# Patient Record
Sex: Female | Born: 1935 | Race: White | Hispanic: No | Smoking: Former smoker
Health system: Southern US, Community
[De-identification: ages and names within clinical notes are randomized; demographics above are authoritative.]

## PROBLEM LIST (undated history)

## (undated) DIAGNOSIS — N19 Unspecified kidney failure: Secondary | ICD-10-CM

## (undated) DIAGNOSIS — J449 Chronic obstructive pulmonary disease, unspecified: Secondary | ICD-10-CM

## (undated) DIAGNOSIS — B370 Candidal stomatitis: Secondary | ICD-10-CM

## (undated) DIAGNOSIS — I639 Cerebral infarction, unspecified: Secondary | ICD-10-CM

## (undated) DIAGNOSIS — R399 Unspecified symptoms and signs involving the genitourinary system: Secondary | ICD-10-CM

## (undated) DIAGNOSIS — M1990 Unspecified osteoarthritis, unspecified site: Secondary | ICD-10-CM

## (undated) DIAGNOSIS — M48061 Spinal stenosis, lumbar region without neurogenic claudication: Secondary | ICD-10-CM

## (undated) DIAGNOSIS — M810 Age-related osteoporosis without current pathological fracture: Secondary | ICD-10-CM

## (undated) DIAGNOSIS — I719 Aortic aneurysm of unspecified site, without rupture: Secondary | ICD-10-CM

## (undated) DIAGNOSIS — G4700 Insomnia, unspecified: Secondary | ICD-10-CM

## (undated) DIAGNOSIS — R1013 Epigastric pain: Secondary | ICD-10-CM

## (undated) DIAGNOSIS — I2510 Atherosclerotic heart disease of native coronary artery without angina pectoris: Secondary | ICD-10-CM

## (undated) DIAGNOSIS — T7840XA Allergy, unspecified, initial encounter: Secondary | ICD-10-CM

## (undated) DIAGNOSIS — M7020 Olecranon bursitis, unspecified elbow: Secondary | ICD-10-CM

## (undated) DIAGNOSIS — E7800 Pure hypercholesterolemia, unspecified: Secondary | ICD-10-CM

## (undated) DIAGNOSIS — E039 Hypothyroidism, unspecified: Secondary | ICD-10-CM

## (undated) DIAGNOSIS — R351 Nocturia: Secondary | ICD-10-CM

## (undated) DIAGNOSIS — L821 Other seborrheic keratosis: Secondary | ICD-10-CM

## (undated) DIAGNOSIS — N952 Postmenopausal atrophic vaginitis: Secondary | ICD-10-CM

## (undated) DIAGNOSIS — R0902 Hypoxemia: Secondary | ICD-10-CM

## (undated) DIAGNOSIS — J45909 Unspecified asthma, uncomplicated: Secondary | ICD-10-CM

## (undated) DIAGNOSIS — Z9049 Acquired absence of other specified parts of digestive tract: Secondary | ICD-10-CM

## (undated) DIAGNOSIS — K219 Gastro-esophageal reflux disease without esophagitis: Secondary | ICD-10-CM

## (undated) DIAGNOSIS — R0681 Apnea, not elsewhere classified: Secondary | ICD-10-CM

## (undated) DIAGNOSIS — I10 Essential (primary) hypertension: Secondary | ICD-10-CM

## (undated) DIAGNOSIS — J189 Pneumonia, unspecified organism: Secondary | ICD-10-CM

## (undated) HISTORY — DX: Atherosclerotic heart disease of native coronary artery without angina pectoris: I25.10

## (undated) HISTORY — DX: Essential (primary) hypertension: I10

## (undated) HISTORY — DX: Postmenopausal atrophic vaginitis: N95.2

## (undated) HISTORY — DX: Aortic aneurysm of unspecified site, without rupture: I71.9

## (undated) HISTORY — DX: Unspecified kidney failure: N19

## (undated) HISTORY — DX: Candidal stomatitis: B37.0

## (undated) HISTORY — DX: Unspecified asthma, uncomplicated: J45.909

## (undated) HISTORY — DX: Age-related osteoporosis without current pathological fracture: M81.0

## (undated) HISTORY — DX: Pneumonia, unspecified organism: J18.9

## (undated) HISTORY — DX: Unspecified osteoarthritis, unspecified site: M19.90

## (undated) HISTORY — DX: Other seborrheic keratosis: L82.1

## (undated) HISTORY — DX: Nocturia: R35.1

## (undated) HISTORY — DX: Hypoxemia: R09.02

## (undated) HISTORY — PX: WRIST SURGERY: SHX841

## (undated) HISTORY — DX: Apnea, not elsewhere classified: R06.81

## (undated) HISTORY — PX: EYE SURGERY: SHX253

## (undated) HISTORY — DX: Acquired absence of other specified parts of digestive tract: Z90.49

## (undated) HISTORY — DX: Cerebral infarction, unspecified: I63.9

## (undated) HISTORY — DX: Allergy, unspecified, initial encounter: T78.40XA

## (undated) HISTORY — PX: BACK SURGERY: SHX140

## (undated) HISTORY — DX: Epigastric pain: R10.13

## (undated) HISTORY — DX: Insomnia, unspecified: G47.00

## (undated) HISTORY — DX: Spinal stenosis, lumbar region without neurogenic claudication: M48.061

## (undated) HISTORY — DX: Gastro-esophageal reflux disease without esophagitis: K21.9

## (undated) HISTORY — DX: Pure hypercholesterolemia, unspecified: E78.00

## (undated) HISTORY — DX: Unspecified symptoms and signs involving the genitourinary system: R39.9

## (undated) HISTORY — DX: Olecranon bursitis, unspecified elbow: M70.20

## (undated) HISTORY — PX: COLON SURGERY: SHX602

## (undated) HISTORY — DX: Chronic obstructive pulmonary disease, unspecified: J44.9

## (undated) HISTORY — PX: APPENDECTOMY: SHX54

---

## 1958-02-12 HISTORY — PX: VAGINAL HYSTERECTOMY: SUR661

## 2003-05-01 ENCOUNTER — Other Ambulatory Visit: Payer: Self-pay

## 2004-01-10 ENCOUNTER — Emergency Department: Payer: Self-pay | Admitting: Emergency Medicine

## 2004-01-10 ENCOUNTER — Other Ambulatory Visit: Payer: Self-pay

## 2004-03-09 ENCOUNTER — Ambulatory Visit: Payer: Self-pay | Admitting: Unknown Physician Specialty

## 2004-03-23 ENCOUNTER — Ambulatory Visit: Payer: Self-pay | Admitting: Psychiatry

## 2004-05-04 ENCOUNTER — Ambulatory Visit: Payer: Self-pay | Admitting: Psychiatry

## 2004-06-06 ENCOUNTER — Emergency Department: Payer: Self-pay | Admitting: Emergency Medicine

## 2004-06-22 ENCOUNTER — Ambulatory Visit: Payer: Self-pay | Admitting: Psychiatry

## 2004-06-23 ENCOUNTER — Ambulatory Visit: Payer: Self-pay

## 2004-07-25 ENCOUNTER — Ambulatory Visit: Payer: Self-pay | Admitting: Psychiatry

## 2004-08-03 ENCOUNTER — Ambulatory Visit: Payer: Self-pay | Admitting: Psychiatry

## 2004-09-25 ENCOUNTER — Ambulatory Visit: Payer: Self-pay | Admitting: Family Medicine

## 2004-11-29 ENCOUNTER — Ambulatory Visit: Payer: Self-pay | Admitting: Unknown Physician Specialty

## 2005-01-03 ENCOUNTER — Ambulatory Visit: Payer: Self-pay | Admitting: Unknown Physician Specialty

## 2005-01-11 ENCOUNTER — Ambulatory Visit: Payer: Self-pay | Admitting: Unknown Physician Specialty

## 2005-01-26 ENCOUNTER — Ambulatory Visit: Payer: Self-pay | Admitting: Surgery

## 2005-02-06 ENCOUNTER — Other Ambulatory Visit: Payer: Self-pay

## 2005-02-06 ENCOUNTER — Ambulatory Visit: Payer: Self-pay | Admitting: Surgery

## 2005-02-09 ENCOUNTER — Inpatient Hospital Stay: Payer: Self-pay | Admitting: Surgery

## 2005-02-12 HISTORY — PX: OTHER SURGICAL HISTORY: SHX169

## 2005-04-03 ENCOUNTER — Ambulatory Visit: Payer: Self-pay | Admitting: Internal Medicine

## 2005-08-13 ENCOUNTER — Emergency Department: Payer: Self-pay | Admitting: Emergency Medicine

## 2005-11-20 DIAGNOSIS — M810 Age-related osteoporosis without current pathological fracture: Secondary | ICD-10-CM | POA: Insufficient documentation

## 2005-11-20 DIAGNOSIS — I10 Essential (primary) hypertension: Secondary | ICD-10-CM

## 2005-11-21 ENCOUNTER — Ambulatory Visit: Payer: Self-pay | Admitting: Unknown Physician Specialty

## 2005-12-07 ENCOUNTER — Other Ambulatory Visit: Payer: Self-pay

## 2006-01-10 ENCOUNTER — Inpatient Hospital Stay: Payer: Self-pay | Admitting: Unknown Physician Specialty

## 2006-07-05 ENCOUNTER — Inpatient Hospital Stay: Payer: Self-pay | Admitting: Internal Medicine

## 2006-07-05 ENCOUNTER — Other Ambulatory Visit: Payer: Self-pay

## 2006-08-14 ENCOUNTER — Ambulatory Visit: Payer: Self-pay | Admitting: Unknown Physician Specialty

## 2006-08-19 ENCOUNTER — Ambulatory Visit: Payer: Self-pay | Admitting: Family Medicine

## 2006-08-28 ENCOUNTER — Ambulatory Visit: Payer: Self-pay | Admitting: Unknown Physician Specialty

## 2007-01-31 DIAGNOSIS — E7800 Pure hypercholesterolemia, unspecified: Secondary | ICD-10-CM

## 2007-02-10 ENCOUNTER — Ambulatory Visit: Payer: Self-pay | Admitting: Family Medicine

## 2007-09-01 ENCOUNTER — Ambulatory Visit: Payer: Self-pay | Admitting: Unknown Physician Specialty

## 2007-09-10 ENCOUNTER — Ambulatory Visit: Payer: Self-pay | Admitting: Pain Medicine

## 2007-09-16 DIAGNOSIS — M48061 Spinal stenosis, lumbar region without neurogenic claudication: Secondary | ICD-10-CM

## 2007-09-18 ENCOUNTER — Ambulatory Visit: Payer: Self-pay | Admitting: Pain Medicine

## 2007-10-07 ENCOUNTER — Ambulatory Visit: Payer: Self-pay | Admitting: Physician Assistant

## 2007-10-09 ENCOUNTER — Ambulatory Visit: Payer: Self-pay | Admitting: Pain Medicine

## 2007-10-23 ENCOUNTER — Ambulatory Visit: Payer: Self-pay | Admitting: Pain Medicine

## 2007-11-27 DIAGNOSIS — K219 Gastro-esophageal reflux disease without esophagitis: Secondary | ICD-10-CM | POA: Insufficient documentation

## 2007-12-10 ENCOUNTER — Ambulatory Visit: Payer: Self-pay | Admitting: Gastroenterology

## 2007-12-16 ENCOUNTER — Ambulatory Visit: Payer: Self-pay | Admitting: Unknown Physician Specialty

## 2008-02-13 HISTORY — PX: OTHER SURGICAL HISTORY: SHX169

## 2008-03-26 ENCOUNTER — Ambulatory Visit: Payer: Self-pay | Admitting: Unknown Physician Specialty

## 2008-03-29 ENCOUNTER — Inpatient Hospital Stay: Payer: Self-pay | Admitting: Unknown Physician Specialty

## 2008-05-03 ENCOUNTER — Inpatient Hospital Stay: Payer: Self-pay | Admitting: Internal Medicine

## 2008-11-12 DIAGNOSIS — M19049 Primary osteoarthritis, unspecified hand: Secondary | ICD-10-CM | POA: Insufficient documentation

## 2009-07-19 DIAGNOSIS — I719 Aortic aneurysm of unspecified site, without rupture: Secondary | ICD-10-CM | POA: Insufficient documentation

## 2009-07-26 ENCOUNTER — Ambulatory Visit: Payer: Self-pay | Admitting: Family Medicine

## 2009-12-07 ENCOUNTER — Ambulatory Visit: Payer: Self-pay

## 2009-12-16 ENCOUNTER — Emergency Department: Payer: Self-pay | Admitting: Emergency Medicine

## 2010-01-08 ENCOUNTER — Emergency Department: Payer: Self-pay | Admitting: Unknown Physician Specialty

## 2010-04-17 ENCOUNTER — Ambulatory Visit: Payer: Self-pay

## 2010-04-30 ENCOUNTER — Emergency Department: Payer: Self-pay | Admitting: Emergency Medicine

## 2010-05-05 ENCOUNTER — Ambulatory Visit: Payer: Self-pay | Admitting: Cardiovascular Disease

## 2010-08-07 ENCOUNTER — Emergency Department: Payer: Self-pay | Admitting: Emergency Medicine

## 2010-09-12 ENCOUNTER — Ambulatory Visit: Payer: Self-pay | Admitting: Family Medicine

## 2011-03-23 ENCOUNTER — Ambulatory Visit: Payer: Self-pay | Admitting: Family Medicine

## 2011-06-10 ENCOUNTER — Inpatient Hospital Stay: Payer: Self-pay | Admitting: Internal Medicine

## 2011-06-10 LAB — COMPREHENSIVE METABOLIC PANEL
Alkaline Phosphatase: 62 U/L (ref 50–136)
Anion Gap: 8 (ref 7–16)
Calcium, Total: 8.4 mg/dL — ABNORMAL LOW (ref 8.5–10.1)
Chloride: 103 mmol/L (ref 98–107)
Creatinine: 1.11 mg/dL (ref 0.60–1.30)
EGFR (Non-African Amer.): 46 — ABNORMAL LOW
Glucose: 101 mg/dL — ABNORMAL HIGH (ref 65–99)
Potassium: 4.1 mmol/L (ref 3.5–5.1)
SGOT(AST): 22 U/L (ref 15–37)
SGPT (ALT): 21 U/L
Sodium: 140 mmol/L (ref 136–145)

## 2011-06-10 LAB — CBC
HCT: 35.1 % (ref 35.0–47.0)
HGB: 11.5 g/dL — ABNORMAL LOW (ref 12.0–16.0)
MCH: 30.8 pg (ref 26.0–34.0)
MCHC: 32.8 g/dL (ref 32.0–36.0)
MCV: 94 fL (ref 80–100)
RBC: 3.75 10*6/uL — ABNORMAL LOW (ref 3.80–5.20)
RDW: 14.2 % (ref 11.5–14.5)
WBC: 9.6 10*3/uL (ref 3.6–11.0)

## 2011-06-10 LAB — CK TOTAL AND CKMB (NOT AT ARMC)
CK-MB: 3.5 ng/mL (ref 0.5–3.6)
CK-MB: 2.5 ng/mL (ref 0.5–3.6)

## 2011-06-10 LAB — TROPONIN I
Troponin-I: 0.02 ng/mL
Troponin-I: 0.02 ng/mL

## 2011-06-11 LAB — CBC WITH DIFFERENTIAL/PLATELET
Basophil #: 0 10*3/uL (ref 0.0–0.1)
Basophil %: 0.3 %
HGB: 10.8 g/dL — ABNORMAL LOW (ref 12.0–16.0)
Lymphocyte %: 6.6 %
MCH: 31.7 pg (ref 26.0–34.0)
Monocyte %: 2.5 %
Neutrophil #: 9.2 10*3/uL — ABNORMAL HIGH (ref 1.4–6.5)
Neutrophil %: 90.6 %
Platelet: 188 10*3/uL (ref 150–440)
WBC: 10.1 10*3/uL (ref 3.6–11.0)

## 2011-06-11 LAB — LIPID PANEL
HDL Cholesterol: 55 mg/dL (ref 40–60)
VLDL Cholesterol, Calc: 30 mg/dL (ref 5–40)

## 2011-06-11 LAB — BASIC METABOLIC PANEL
BUN: 27 mg/dL — ABNORMAL HIGH (ref 7–18)
Calcium, Total: 7.9 mg/dL — ABNORMAL LOW (ref 8.5–10.1)
Co2: 26 mmol/L (ref 21–32)
EGFR (African American): >60
EGFR (Non-African Amer.): 55 — ABNORMAL LOW
Glucose: 163 mg/dL — ABNORMAL HIGH (ref 65–99)
Osmolality: 288 (ref 275–301)
Sodium: 140 mmol/L (ref 136–145)

## 2011-06-11 LAB — HEMOGLOBIN A1C: Hemoglobin A1C: 6.1 % (ref 4.2–6.3)

## 2011-12-19 ENCOUNTER — Ambulatory Visit: Payer: Self-pay | Admitting: Family Medicine

## 2012-07-20 ENCOUNTER — Emergency Department: Payer: Self-pay | Admitting: Emergency Medicine

## 2012-07-20 LAB — COMPREHENSIVE METABOLIC PANEL
Albumin: 3.6 g/dL (ref 3.4–5.0)
Alkaline Phosphatase: 82 U/L (ref 50–136)
Anion Gap: 9 (ref 7–16)
BUN: 20 mg/dL — ABNORMAL HIGH (ref 7–18)
Bilirubin,Total: 0.6 mg/dL (ref 0.2–1.0)
Chloride: 102 mmol/L (ref 98–107)
Co2: 26 mmol/L (ref 21–32)
EGFR (African American): 52 — ABNORMAL LOW
EGFR (Non-African Amer.): 45 — ABNORMAL LOW
Glucose: 92 mg/dL (ref 65–99)
SGOT(AST): 32 U/L (ref 15–37)
SGPT (ALT): 23 U/L (ref 12–78)
Sodium: 137 mmol/L (ref 136–145)

## 2012-07-20 LAB — CBC WITH DIFFERENTIAL/PLATELET
Eosinophil #: 0.2 10*3/uL (ref 0.0–0.7)
Eosinophil %: 1.9 %
HCT: 33.3 % — ABNORMAL LOW (ref 35.0–47.0)
HGB: 11.2 g/dL — ABNORMAL LOW (ref 12.0–16.0)
MCHC: 33.8 g/dL (ref 32.0–36.0)
MCV: 93 fL (ref 80–100)
Monocyte #: 1.1 x10 3/mm — ABNORMAL HIGH (ref 0.2–0.9)
Monocyte %: 12.6 %
RBC: 3.57 10*6/uL — ABNORMAL LOW (ref 3.80–5.20)
WBC: 8.4 10*3/uL (ref 3.6–11.0)

## 2012-07-26 LAB — CULTURE, BLOOD (SINGLE)

## 2013-03-17 ENCOUNTER — Ambulatory Visit: Payer: Self-pay | Admitting: Family Medicine

## 2013-07-05 ENCOUNTER — Inpatient Hospital Stay: Payer: Self-pay | Admitting: Internal Medicine

## 2013-07-05 LAB — COMPREHENSIVE METABOLIC PANEL
ALBUMIN: 3.6 g/dL (ref 3.4–5.0)
ALK PHOS: 77 U/L
ANION GAP: 5 — AB (ref 7–16)
BILIRUBIN TOTAL: 1 mg/dL (ref 0.2–1.0)
BUN: 26 mg/dL — AB (ref 7–18)
CALCIUM: 8.6 mg/dL (ref 8.5–10.1)
CREATININE: 1.59 mg/dL — AB (ref 0.60–1.30)
Chloride: 101 mmol/L (ref 98–107)
Co2: 27 mmol/L (ref 21–32)
EGFR (African American): 34 — ABNORMAL LOW
EGFR (Non-African Amer.): 29 — ABNORMAL LOW
Glucose: 128 mg/dL — ABNORMAL HIGH (ref 65–99)
Osmolality: 273 (ref 275–301)
POTASSIUM: 3.9 mmol/L (ref 3.5–5.1)
SGOT(AST): 33 U/L (ref 15–37)
SGPT (ALT): 32 U/L (ref 12–78)
SODIUM: 133 mmol/L — AB (ref 136–145)
TOTAL PROTEIN: 7.2 g/dL (ref 6.4–8.2)

## 2013-07-05 LAB — URINALYSIS, COMPLETE
BLOOD: NEGATIVE
Bacteria: NONE SEEN
Bilirubin,UR: NEGATIVE
Glucose,UR: NEGATIVE mg/dL (ref 0–75)
Ketone: NEGATIVE
LEUKOCYTE ESTERASE: NEGATIVE
Nitrite: NEGATIVE
Ph: 5 (ref 4.5–8.0)
Protein: NEGATIVE
RBC,UR: 1 /HPF (ref 0–5)
SPECIFIC GRAVITY: 1.018 (ref 1.003–1.030)
Squamous Epithelial: <1
WBC UR: 2 /HPF (ref 0–5)

## 2013-07-05 LAB — CBC
HCT: 38.1 % (ref 35.0–47.0)
HGB: 12.7 g/dL (ref 12.0–16.0)
MCH: 32.1 pg (ref 26.0–34.0)
MCHC: 33.2 g/dL (ref 32.0–36.0)
MCV: 97 fL (ref 80–100)
Platelet: 174 10*3/uL (ref 150–440)
RBC: 3.94 10*6/uL (ref 3.80–5.20)
RDW: 13.3 % (ref 11.5–14.5)
WBC: 17.5 10*3/uL — ABNORMAL HIGH (ref 3.6–11.0)

## 2013-07-05 LAB — TROPONIN I

## 2013-07-05 LAB — TSH: Thyroid Stimulating Horm: 2.44 u[IU]/mL

## 2013-07-06 LAB — BASIC METABOLIC PANEL
Anion Gap: 9 (ref 7–16)
BUN: 38 mg/dL — ABNORMAL HIGH (ref 7–18)
CO2: 26 mmol/L (ref 21–32)
Calcium, Total: 8.5 mg/dL (ref 8.5–10.1)
Chloride: 97 mmol/L — ABNORMAL LOW (ref 98–107)
Creatinine: 1.36 mg/dL — ABNORMAL HIGH (ref 0.60–1.30)
EGFR (Non-African Amer.): 35 — ABNORMAL LOW
GFR CALC AF AMER: 41 — AB
Glucose: 177 mg/dL — ABNORMAL HIGH (ref 65–99)
OSMOLALITY: 278 (ref 275–301)
POTASSIUM: 3.8 mmol/L (ref 3.5–5.1)
Sodium: 132 mmol/L — ABNORMAL LOW (ref 136–145)

## 2013-07-06 LAB — CBC WITH DIFFERENTIAL/PLATELET
BASOS PCT: 0.3 %
Basophil #: 0 10*3/uL (ref 0.0–0.1)
EOS ABS: 0 10*3/uL (ref 0.0–0.7)
Eosinophil %: 0 %
HCT: 32.4 % — ABNORMAL LOW (ref 35.0–47.0)
HGB: 10.9 g/dL — ABNORMAL LOW (ref 12.0–16.0)
Lymphocyte #: 0.6 10*3/uL — ABNORMAL LOW (ref 1.0–3.6)
Lymphocyte %: 4.3 %
MCH: 32.5 pg (ref 26.0–34.0)
MCHC: 33.7 g/dL (ref 32.0–36.0)
MCV: 97 fL (ref 80–100)
Monocyte #: 0.3 x10 3/mm (ref 0.2–0.9)
Monocyte %: 2.6 %
NEUTROS ABS: 12.4 10*3/uL — AB (ref 1.4–6.5)
NEUTROS PCT: 92.8 %
PLATELETS: 151 10*3/uL (ref 150–440)
RBC: 3.35 10*6/uL — ABNORMAL LOW (ref 3.80–5.20)
RDW: 13 % (ref 11.5–14.5)
WBC: 13.4 10*3/uL — ABNORMAL HIGH (ref 3.6–11.0)

## 2013-07-06 LAB — MAGNESIUM: MAGNESIUM: 2.5 mg/dL — AB

## 2013-07-10 LAB — CULTURE, BLOOD (SINGLE)

## 2013-07-15 ENCOUNTER — Observation Stay: Payer: Self-pay | Admitting: Internal Medicine

## 2013-07-15 LAB — COMPREHENSIVE METABOLIC PANEL
ALBUMIN: 3.2 g/dL — AB (ref 3.4–5.0)
AST: 15 U/L (ref 15–37)
Alkaline Phosphatase: 71 U/L
Anion Gap: 6 — ABNORMAL LOW (ref 7–16)
BILIRUBIN TOTAL: 0.9 mg/dL (ref 0.2–1.0)
BUN: 14 mg/dL (ref 7–18)
CALCIUM: 8.8 mg/dL (ref 8.5–10.1)
CREATININE: 1.13 mg/dL (ref 0.60–1.30)
Chloride: 97 mmol/L — ABNORMAL LOW (ref 98–107)
Co2: 30 mmol/L (ref 21–32)
EGFR (African American): 51 — ABNORMAL LOW
GFR CALC NON AF AMER: 44 — AB
Glucose: 109 mg/dL — ABNORMAL HIGH (ref 65–99)
OSMOLALITY: 267 (ref 275–301)
Potassium: 4.2 mmol/L (ref 3.5–5.1)
SGPT (ALT): 18 U/L (ref 12–78)
SODIUM: 133 mmol/L — AB (ref 136–145)
TOTAL PROTEIN: 6.8 g/dL (ref 6.4–8.2)

## 2013-07-15 LAB — CBC WITH DIFFERENTIAL/PLATELET
BASOS PCT: 0.6 %
Basophil #: 0.1 10*3/uL (ref 0.0–0.1)
EOS ABS: 0 10*3/uL (ref 0.0–0.7)
Eosinophil %: 0.3 %
HCT: 37.1 % (ref 35.0–47.0)
HGB: 12.2 g/dL (ref 12.0–16.0)
Lymphocyte #: 1.1 10*3/uL (ref 1.0–3.6)
Lymphocyte %: 7.7 %
MCH: 31.7 pg (ref 26.0–34.0)
MCHC: 33 g/dL (ref 32.0–36.0)
MCV: 96 fL (ref 80–100)
MONOS PCT: 7.5 %
Monocyte #: 1.1 x10 3/mm — ABNORMAL HIGH (ref 0.2–0.9)
Neutrophil #: 12 10*3/uL — ABNORMAL HIGH (ref 1.4–6.5)
Neutrophil %: 83.9 %
PLATELETS: 241 10*3/uL (ref 150–440)
RBC: 3.86 10*6/uL (ref 3.80–5.20)
RDW: 13.4 % (ref 11.5–14.5)
WBC: 14.3 10*3/uL — AB (ref 3.6–11.0)

## 2013-07-15 LAB — URINALYSIS, COMPLETE
BLOOD: NEGATIVE
Bacteria: NONE SEEN
Bilirubin,UR: NEGATIVE
Glucose,UR: NEGATIVE mg/dL (ref 0–75)
Leukocyte Esterase: NEGATIVE
Nitrite: NEGATIVE
Ph: 7 (ref 4.5–8.0)
Protein: NEGATIVE
Specific Gravity: 1.014 (ref 1.003–1.030)
Squamous Epithelial: NONE SEEN
WBC UR: <1 /HPF (ref 0–5)

## 2013-07-15 LAB — TSH: THYROID STIMULATING HORM: 4.54 u[IU]/mL — AB

## 2013-07-15 LAB — PRO B NATRIURETIC PEPTIDE: B-Type Natriuretic Peptide: 259 pg/mL (ref 0–450)

## 2013-07-15 LAB — TROPONIN I: Troponin-I: <0.02 ng/mL

## 2013-07-27 ENCOUNTER — Ambulatory Visit: Payer: Self-pay | Admitting: Internal Medicine

## 2013-09-29 DIAGNOSIS — M461 Sacroiliitis, not elsewhere classified: Secondary | ICD-10-CM | POA: Insufficient documentation

## 2014-01-27 ENCOUNTER — Emergency Department: Payer: Self-pay | Admitting: Emergency Medicine

## 2014-01-27 LAB — CBC WITH DIFFERENTIAL/PLATELET
BASOS PCT: 0.3 %
Basophil #: 0.1 10*3/uL (ref 0.0–0.1)
Eosinophil #: 0 10*3/uL (ref 0.0–0.7)
Eosinophil %: 0.1 %
HCT: 36.5 % (ref 35.0–47.0)
HGB: 11.7 g/dL — AB (ref 12.0–16.0)
LYMPHS ABS: 0.9 10*3/uL — AB (ref 1.0–3.6)
LYMPHS PCT: 5.5 %
MCH: 30.8 pg (ref 26.0–34.0)
MCHC: 32.2 g/dL (ref 32.0–36.0)
MCV: 96 fL (ref 80–100)
Monocyte #: 1.6 x10 3/mm — ABNORMAL HIGH (ref 0.2–0.9)
Monocyte %: 10 %
Neutrophil #: 13.4 10*3/uL — ABNORMAL HIGH (ref 1.4–6.5)
Neutrophil %: 84.1 %
PLATELETS: 198 10*3/uL (ref 150–440)
RBC: 3.82 10*6/uL (ref 3.80–5.20)
RDW: 12.6 % (ref 11.5–14.5)
WBC: 16 10*3/uL — AB (ref 3.6–11.0)

## 2014-01-27 LAB — URINALYSIS, COMPLETE
RBC,UR: 4 /HPF (ref 0–5)
Specific Gravity: 1.018 (ref 1.003–1.030)
WBC UR: 73 /HPF (ref 0–5)

## 2014-01-27 LAB — HEPATIC FUNCTION PANEL A (ARMC)
ALK PHOS: 99 U/L
AST: 25 U/L (ref 15–37)
Albumin: 3.7 g/dL (ref 3.4–5.0)
Bilirubin, Direct: 0.2 mg/dL (ref 0.0–0.2)
Bilirubin,Total: 0.9 mg/dL (ref 0.2–1.0)
SGPT (ALT): 20 U/L
Total Protein: 7.5 g/dL (ref 6.4–8.2)

## 2014-01-27 LAB — BASIC METABOLIC PANEL
Anion Gap: 9 (ref 7–16)
BUN: 24 mg/dL — AB (ref 7–18)
CO2: 25 mmol/L (ref 21–32)
CREATININE: 1.48 mg/dL — AB (ref 0.60–1.30)
Calcium, Total: 8.9 mg/dL (ref 8.5–10.1)
Chloride: 98 mmol/L (ref 98–107)
EGFR (Non-African Amer.): 36 — ABNORMAL LOW
GFR CALC AF AMER: 43 — AB
Glucose: 126 mg/dL — ABNORMAL HIGH (ref 65–99)
OSMOLALITY: 270 (ref 275–301)
Potassium: 4.2 mmol/L (ref 3.5–5.1)
Sodium: 132 mmol/L — ABNORMAL LOW (ref 136–145)

## 2014-01-27 LAB — LIPASE, BLOOD: LIPASE: 54 U/L — AB (ref 73–393)

## 2014-01-27 LAB — TROPONIN I: Troponin-I: <0.02 ng/mL

## 2014-02-02 LAB — CULTURE, BLOOD (SINGLE)

## 2014-02-11 ENCOUNTER — Ambulatory Visit: Payer: Self-pay | Admitting: Family Medicine

## 2014-03-02 DIAGNOSIS — R1013 Epigastric pain: Secondary | ICD-10-CM | POA: Diagnosis not present

## 2014-03-02 DIAGNOSIS — Z01812 Encounter for preprocedural laboratory examination: Secondary | ICD-10-CM | POA: Diagnosis not present

## 2014-03-02 DIAGNOSIS — R1011 Right upper quadrant pain: Secondary | ICD-10-CM | POA: Diagnosis not present

## 2014-03-02 DIAGNOSIS — G8929 Other chronic pain: Secondary | ICD-10-CM | POA: Diagnosis not present

## 2014-03-06 DIAGNOSIS — J449 Chronic obstructive pulmonary disease, unspecified: Secondary | ICD-10-CM | POA: Diagnosis not present

## 2014-03-09 DIAGNOSIS — R0602 Shortness of breath: Secondary | ICD-10-CM | POA: Diagnosis not present

## 2014-03-09 DIAGNOSIS — J439 Emphysema, unspecified: Secondary | ICD-10-CM | POA: Diagnosis not present

## 2014-03-12 ENCOUNTER — Ambulatory Visit: Payer: Self-pay | Admitting: Gastroenterology

## 2014-03-12 DIAGNOSIS — R935 Abnormal findings on diagnostic imaging of other abdominal regions, including retroperitoneum: Secondary | ICD-10-CM | POA: Diagnosis not present

## 2014-03-12 DIAGNOSIS — N8189 Other female genital prolapse: Secondary | ICD-10-CM | POA: Diagnosis not present

## 2014-03-12 DIAGNOSIS — K449 Diaphragmatic hernia without obstruction or gangrene: Secondary | ICD-10-CM | POA: Diagnosis not present

## 2014-03-25 DIAGNOSIS — R0602 Shortness of breath: Secondary | ICD-10-CM | POA: Diagnosis not present

## 2014-04-05 DIAGNOSIS — R1084 Generalized abdominal pain: Secondary | ICD-10-CM | POA: Diagnosis not present

## 2014-04-06 DIAGNOSIS — J449 Chronic obstructive pulmonary disease, unspecified: Secondary | ICD-10-CM | POA: Diagnosis not present

## 2014-04-15 DIAGNOSIS — J449 Chronic obstructive pulmonary disease, unspecified: Secondary | ICD-10-CM | POA: Diagnosis not present

## 2014-04-15 DIAGNOSIS — R0602 Shortness of breath: Secondary | ICD-10-CM | POA: Diagnosis not present

## 2014-05-05 DIAGNOSIS — J449 Chronic obstructive pulmonary disease, unspecified: Secondary | ICD-10-CM | POA: Diagnosis not present

## 2014-05-18 DIAGNOSIS — E780 Pure hypercholesterolemia: Secondary | ICD-10-CM | POA: Diagnosis not present

## 2014-05-18 DIAGNOSIS — Z1389 Encounter for screening for other disorder: Secondary | ICD-10-CM | POA: Diagnosis not present

## 2014-05-18 DIAGNOSIS — R1013 Epigastric pain: Secondary | ICD-10-CM | POA: Diagnosis not present

## 2014-05-18 DIAGNOSIS — E039 Hypothyroidism, unspecified: Secondary | ICD-10-CM | POA: Diagnosis not present

## 2014-05-18 DIAGNOSIS — Z0000 Encounter for general adult medical examination without abnormal findings: Secondary | ICD-10-CM | POA: Diagnosis not present

## 2014-05-18 DIAGNOSIS — J432 Centrilobular emphysema: Secondary | ICD-10-CM | POA: Diagnosis not present

## 2014-05-18 DIAGNOSIS — R252 Cramp and spasm: Secondary | ICD-10-CM | POA: Diagnosis not present

## 2014-05-18 DIAGNOSIS — M810 Age-related osteoporosis without current pathological fracture: Secondary | ICD-10-CM | POA: Diagnosis not present

## 2014-05-24 DIAGNOSIS — R0602 Shortness of breath: Secondary | ICD-10-CM | POA: Diagnosis not present

## 2014-05-24 DIAGNOSIS — J449 Chronic obstructive pulmonary disease, unspecified: Secondary | ICD-10-CM | POA: Diagnosis not present

## 2014-05-24 DIAGNOSIS — J301 Allergic rhinitis due to pollen: Secondary | ICD-10-CM | POA: Diagnosis not present

## 2014-05-24 DIAGNOSIS — E780 Pure hypercholesterolemia: Secondary | ICD-10-CM | POA: Diagnosis not present

## 2014-06-05 DIAGNOSIS — J449 Chronic obstructive pulmonary disease, unspecified: Secondary | ICD-10-CM | POA: Diagnosis not present

## 2014-06-05 NOTE — Discharge Summary (Signed)
PATIENT NAME:  Cynthia Cynthia [name], Cynthia Cynthia [name] MR#:  [id number] DATE OF BIRTH:  09-14-1925  DATE OF ADMISSION:  07/15/2013 DATE OF DISCHARGE:  07/16/2013  ADMISSION DIAGNOSIS: Diarrhea.   DISCHARGE DIAGNOSES: 1.  Diarrhea, resolved.  2.  Shortness of breath, likely secondary to anxiety.  3.  History of chronic obstructive pulmonary disease with chronic respiratory failure on 2 liters oxygen.  4.  Hypothyroidism.  5.  Hypertension.   CONSULTATIONS: None.   PERTINENT LABORATORIES AT DISCHARGE:  Please refer to the laboratories from admission. No new labs were taken.   HOSPITAL COURSE: Cynthia [name] very pleasant 81 year old female who presented with diarrhea. For further details, please refer to the H and P.  1.  Diarrhea after antibiotic use. She was admitted for diarrhea. She was checked for C. diff; however, she had no episodes of diarrhea while in the hospital.  2.  Shortness of breath. The patient presented with some shortness of breath along with the diarrhea. This is likely secondary to anxiety. She had no evidence of pneumonia or COPD exacerbation. She is on her baseline oxygen and her symptoms resolved without any intervention.  3.  Hypothyroidism. Her TSH was slightly elevated. She will need followup TFTs as an outpatient.  4.  Hypertension. The patient was continued on her Imdur.  5.  Leukocytosis. The patient presented with elevated white blood cell count on admission, likely secondary to her diarrhea and was thought to be reactive. The patient was afebrile throughout  her hospitalization.   DISCHARGE MEDICATIONS: 1.  Albuterol/ipratropium 3 mL 4 times Cynthia [name] day p.r.n.  2.  Aspirin 81 mg daily.  3.  Synthroid 75 mcg half tablet daily.  4.  Spiriva 18 mcg daily.  5.  Proventil 2 puffs 4 times Cynthia [name] day as needed.  6.  Ocuvite 1 tablet daily.  7.  Ventolin HFA 2 puffs 4 times Cynthia [name] day.  8.  Lansoprazole 30 mg daily.  9.  Imdur 60 mg daily.  10.  Dulera 2 puffs b.i.d.  11.  Magnesium 250 b.i.d.  12.  Potassium  gluconate 595 daily.  13.  Cranberry tablet daily.  14.  Calcium and D 1 tablet b.i.d.  15.  Vitamin D3, 2000 International Units daily.  16.  Ferrous sulfate 325 t.i.d.  17.  Xanax 0.5 mg t.i.d. p.r.n.   Discharge home with home health physical therapy and nurse.   DISCHARGE OXYGEN: Two liters nasal cannula.   DISCHARGE FOLLOWUP:  The patient will follow up with Dr. Maurine Ministerennis Chrismon in 1 week.   The patient is stable for discharge   time 35 minutes  ____________________________ Mailen Newborn P. Juliene PinaMody, MD spm:dmm D: 07/16/2013 12:28:53 ET T: 07/16/2013 12:44:31 ET JOB#: [id number]  cc: Tanieka Pownall P. Juliene PinaMody, MD, <Dictator> Jodell Ciproennis E. Chrismon, PA Demia Viera P Kervin Bones MD ELECTRONICALLY SIGNED 07/16/2013 13:10

## 2014-06-05 NOTE — H&P (Signed)
PATIENT NAME:  Cynthia [name] MR#:  [id number] DATE OF BIRTH:  [birth year]  DATE OF ADMISSION:  07/15/2013  PRIMARY CARE PHYSICIAN:  Dr. Freda MunroSaadat Khan.   CHIEF COMPLAINT: Shortness of breath and nausea, vomiting, diarrhea.   HISTORY OF PRESENT ILLNESS: This is an 81 year old female who was just recently discharged from the hospital about 5 to 6 days ago who presents back due to worsening shortness of breath and nausea, vomiting and diarrhea. The patient was recently in the hospital for COPD exacerbation, pneumonia and discharged on [name] prednisone taper and oral Augmentin. She went to see her primary care physician who increased the duration of her antibiotics. Today she felt more short of breath and has been having diarrhea on and off for the past 24 hours. She has gone more than 6 times today. Diarrhea is nonbloody and clear in nature. She denies any nausea, vomiting, fevers, chills, chest pain or any other associated symptoms. She came to the Emergency Room and still had 3 more episodes of diarrhea. Hospitalist services were contacted for further treatment and evaluation.   REVIEW OF SYSTEMS:  CONSTITUTIONAL: No documented fever. Positive weakness. No weight gain. No weight loss.  EYES: No blurred or double vision.  ENT: No tinnitus. No postnasal drip. No redness of the oropharynx.  RESPIRATORY: No cough. No wheeze. No hemoptysis. Positive dyspnea. Positive COPD.  CARDIOVASCULAR: No chest pain. No orthopnea. No palpitations. No syncope.  GASTROINTESTINAL: No nausea. No vomiting. No diarrhea. No abdominal pain. No melena or hematochezia.  GENITOURINARY: No dysuria or hematuria.  ENDOCRINE: No polyuria or nocturia. No heat or cold intolerance.  HEMATOLOGIC: No anemia. No bruising. No bleeding.  INTEGUMENT: No rashes. No lesions.  MUSCULOSKELETAL: No arthritis. No swelling. No gout.  NEUROLOGIC: No numbness or tingling. No ataxia. No seizure-type activity. PSYCHIATRIC:  Positive anxiety. No insomnia.  No ADD.   PAST MEDICAL HISTORY: Consistent with COPD with chronic respiratory failure, coronary artery disease, chronic anemia, osteoarthritis, chronic low back pain, hypothyroidism, anxiety.   ALLERGIES: CODEINE.   SOCIAL HISTORY: Used to be [name] smoker, had about [name] 20 to 30 pack-year smoking history, quit many years ago. No alcohol abuse. No illicit drug abuse. Lives at home by herself.   FAMILY HISTORY: Both mother and father are deceased. Mother died from old age. Father died from complications of myocardial infarction.   CURRENT MEDICATIONS: As follows: DuoNeb 4 times daily as needed, Xanax 0.5 mg t.i.d. as needed, aspirin 81 mg daily, calcium vitamin D 1 tab b.i.d., Dulera 5/100 two puffs b.i.d., iron sulfate 325 mg t.i.d., Imdur 60 mg daily, Prevacid 30 mg daily, Synthroid 75 mcg daily, magnesium gluconate 250 mg b.i.d., Ocuvite 1 tab daily, potassium gluconate 1 tab daily, albuterol inhaler 2 puffs 4 times daily as needed, Spiriva 1 puff daily, albuterol inhaler 2 puffs 4 times daily as needed and vitamin D3 2000 international units daily.   PHYSICAL EXAMINATION: Presently is as follows: VITAL SIGNS:  Temperature 98.5, pulse 98, respirations 22, blood pressure 116/59, sats 97% on 2 liters nasal cannula.  GENERAL: She is [name] pleasant-appearing female, somewhat anxious in mild respiratory distress. HEAD, EYES, EARS, NOSE AND THROAT: Atraumatic, normocephalic. Extraocular muscles are intact. Pupils equal and reactive to light. Sclerae anicteric. No conjunctival injection. No pharyngeal erythema.  NECK: Supple. There is no jugular venous distention. No bruits, lymphadenopathy, thyromegaly.  HEART: Regular rate and rhythm, tachycardic. No murmurs. No rubs. No clicks.  LUNGS: She has prolonged inspiratory and expiratory effort. Good air entry  bilaterally. No rales. No rhonchi. No wheezes. No dullness to percussion. Positive use of accessory muscles.  ABDOMEN: Soft, flat, nontender, nondistended. Has  good bowel sounds. No hepatosplenomegaly appreciated.  EXTREMITIES: No evidence of any cyanosis, clubbing or peripheral edema. Has +2 pedal and radial pulses bilaterally.  NEUROLOGIC: The patient is alert, awake and oriented x 3. No focal motor or sensory deficits appreciated bilaterally.  SKIN: Moist and warm with no rashes appreciated.  LYMPHATIC: There is no cervical or axillary lymphadenopathy.   LABORATORY DATA: Serum glucose of 109, BUN 14, creatinine 1.1. Sodium 133, potassium 4.2, chloride 97, bicarb 30. The patient's LFTs are within normal limits. Troponin less than 0.02. TSH 4.5, white cell count 14.3, hemoglobin 12.2, hematocrit 37.1, platelet count 241. Urinalysis within normal limits.   The patient did have [name] chest x-ray done which showed right lower lobe atelectasis or pneumonia superimposed on COPD.  No evidence of CHF.   ASSESSMENT AND PLAN: This is an 81 year old female with [name] history of chronic obstructive pulmonary disease, hypertension, hypothyroidism, history of recent pneumonia, who presents to the hospital due to worsening shortness of breath and nausea, vomiting and diarrhea.  1.  Nausea, vomiting, diarrhea. The exact etiology is unclear but suspicious for possible underlying Clostridium difficile as the patient has been on antibiotics recently. For now, I will continue supportive care with IV fluids, antiemetics. I will check stool for Clostridium difficile and comprehensive culture and follow her clinically. 2.  Shortness of breath. This is likely chronic and related to underlying chronic obstructive pulmonary disease. I do not think her chest x-ray shows any evidence of acute pneumonia. I will hold off any antibiotics at this time. I think [name] lot of her shortness of breath is probably anxiety mediated. I will continue to [facility], Spiriva and as needed DuoNeb. Give her some Xanax for anxiety and follow her clinically.  3.  Anxiety. Continue with her Xanax.  4.  Hypothyroidism.  Continue Synthroid. 5.  Hypertension. Continue Imdur.  DISPOSITION:   I think the patient will likely benefit from home health nursing and physical therapy services. I will get [name] case management and [name] physical therapy consult.  The patient is a DO NOT INTUBATE/DO NOT RESUSCITATE.   TIME SPENT:  50 minutes.    ____________________________ Rolly Pancake. Cherlynn Kaiser, MD vjs:ce D: 07/15/2013 16:36:39 ET T: 07/15/2013 16:51:35 ET JOB#: [id number]  cc: Rolly Pancake. Cherlynn Kaiser, MD, <Dictator> Houston Siren MD ELECTRONICALLY SIGNED 07/18/2013 21:55

## 2014-06-05 NOTE — Discharge Summary (Signed)
PATIENT NAME:  Cynthia [name] MR#:  [id number] DATE OF BIRTH:  12/12/1925  DATE OF ADMISSION:  07/05/2013 DATE OF DISCHARGE:  07/06/2013  DISCHARGE DIAGNOSES: 1.  Acute on chronic hypoxic respiratory failure due to chronic obstructive pulmonary disease exacerbation and/or pneumonia. 2.  Sepsis present on admission, resolved with treatment. 3.  Pneumonia.  4.  Chronic obstructive pulmonary disease exacerbation.  5.  Acute on chronic renal failure.   SECONDARY DIAGNOSES: 1.  Chronic obstructive pulmonary disease.  2.  Chronic respiratory failure, on 2 liters of oxygen.  3.  Coronary artery disease.  4.  Chronic anemia.  5.  Osteoarthritis.  6.  Chronic low back pain.  7.  Hypothyroidism.   CONSULTATIONS: None.   PROCEDURES AND RADIOLOGY: Chest x-ray on 24th of May showed possible pneumonia.   UA on admission was negative.   Blood cultures x2 were negative.   HISTORY AND SHORT HOSPITAL COURSE: The patient is an 81 year old female with the above-mentioned medical problems who was admitted for basilar pneumonia with hypoxia and acute respiratory failure. Please see Dr. Teena IraniElgergawy's dictated history and physical for further details. The patient was continued on IV antibiotics along with steroids and breathing treatment for COPD exacerbation and was improving, was feeling much better by the 25th of May and was discharged home in stable condition.   PERTINENT DISCHARGE PHYSICAL EXAMINATION: VITAL SIGNS: On the date of discharge, her temperature was 98.1, heart rate 90 per minute, respirations 22 per minute, blood pressure 133/71 mmHg, and she was saturating 97% on 2 liters oxygen via nasal cannula, which was her baseline.  CARDIOVASCULAR: S1, S2 normal. No murmurs, rubs, or gallops.  LUNGS: Clear to auscultation bilaterally. No wheezing, rales, rhonchi, or crepitation.  ABDOMEN: Soft, benign.  NEUROLOGIC: Nonfocal examination. All other physical examination remained at the baseline.    DISCHARGE MEDICATIONS: 1.  Advair 500/50 one puff inhaled daily.  2.  DuoNeb inhaled 4 times [name] day as needed.  3.  Aspirin 81 mg p.o. daily.  4.  Levothyroxine 75 mcg 1/2 tablet p.o. daily. 5.  Spiriva once daily.  6.  Proventil 2 puffs inhaled 4 times [name] day as needed.  7.  Fish oil 1000 mg p.o. daily. 8.  Ocuvite 1 capsule p.o. daily. 9.  Ventolin HFA 2 puffs inhaled 4 times [name] day. 10.  Lansoprazole 30 mg p.o. daily.  11.  Isosorbide mononitrate 60 mg p.o. daily. 12.  Dulera 2 puffs inhaled twice [name] day. 13.  Magnesium gluconate 50 mg p.o. b.i.d.  14.  Potassium gluconate 1 capsule p.o. daily.  15.  Cranberry once daily.  16.  Calcium with vitamin D 1 tablet p.o. twice [name] day. 17.  Vitamin D3 2000 international units once daily.  18.  Ferrous sulfate 325 mg p.o. 3 times [name] day.  19.  Prednisone 60 mg p.o. daily taper by 10 mg daily until finished.  20.  Augmentin 875 mg/125 mg 1 tablet twice [name] day for 5 days.  DISCHARGE DIET: Low sodium, low fat, low cholesterol.   DISCHARGE ACTIVITY: As tolerated.   DISCHARGE INSTRUCTIONS AND FOLLOW-UP: The patient was instructed to follow up with her primary care physician, Mr. Maurine MinisterDennis Chrismon in 1 to 2 weeks. She will need follow-up with Dr. Meredeth IdeFleming in 2 to 4 weeks.   TOTAL TIME DISCHARGING THIS PATIENT: 55 minutes.  ____________________________ Ellamae SiaVipul S. Sherryll BurgerShah, MD vss:sb D: 07/10/2013 10:56:20 ET T: 07/10/2013 11:12:50 ET JOB#: [id number]  cc: Gil Ingwersen S. Sherryll BurgerShah, MD, <Dictator> Jodell Ciproennis E. Chrismon,  PA Herbon E. Meredeth Ide, MD Ellamae Sia [facility] MD ELECTRONICALLY SIGNED 07/10/2013 11:33

## 2014-06-05 NOTE — Discharge Summary (Signed)
Dates of Admission and Diagnosis:  Date of Admission 05-Jul-2013   Date of Discharge 06-Jul-2013   Admitting Diagnosis Acute hypoxic respiratory failure   Final Diagnosis Acute on chronic hypoxic respiratory failure: likely due to COPD and pneumonia.   Discharge Diagnosis 1 Sepsis present on admission: resolved with treatment   2 Pneumonia: improving with antibiotic   3 Chronic obstructive pulmonary disease exacerbation: improving with steroids and antibiotic   4 Acute on chronic renal failure: due to dehydration and volume depletion, close to baseline.   5 Coronary artery disease   6 Anemia of Chronic Disease   7 Osteoarthritis   8 chronic low back pain    Chief Complaint/History of Present Illness Shortness of breath and cough   Allergies:  Codeine: Itching  Vicodin: Itching  Tape: Other  Percocet: Unknown  Ultram: Unknown  Hospital Course:  Condition on Discharge Critical   Code Status:  Code Status No Code/Do Not Resuscitate   PHYSICAL EXAM ON DISCHARGE:  Physical Exam:  GEN no acute distress   HEENT pink conjunctivae   NECK supple  No masses   CARD regular rate  no murmur   VASCULAR ACCESS -- Purulent drainage   ABD denies tenderness  soft  normal BS   EXTR negative edema   SKIN No ulcers   NEURO follows commands   PSYCH alert, A+O to time, place, person   DISCHARGE INSTRUCTIONS HOME MEDS:  Medication Reconciliation: Patient's Home Medications at Discharge:     Medication Instructions  advair diskus 500 mcg-50 mcg inhalation powder  1 puff(s) inhaled once a day   albuterol-ipratropium 2.5 mg-0.5 mg/3 ml inhalation solution  3 milliliter(s) inhaled 4 times a day as needed   aspirin low dose 81 mg oral tablet  1 tab(s) orally once a day   levothyroxine 75 mcg (0.075 mg) oral tablet  0.5 tab(s) orally once a day   spiriva 18 mcg inhalation capsule  1 cap(s) via handihaler inhaled once a day   proventil hfa 90 mcg/inh inhalation aerosol  2  puff(s) inhaled 4 times a day as needed.   fish oil 1000 mg oral capsule  1 cap(s) orally once a day   ocuvite lutein oral capsule  1 cap(s) orally once a day   ventolin hfa cfc free 90 mcg/inh inhalation aerosol  2 puff(s) inhaled 4 times a day   lansoprazole 30 mg oral delayed release capsule  1 cap(s) orally once a day   isosorbide mononitrate extended release 60 mg oral tablet, extended release  1 tab(s) orally once a day (in the morning)   dulera 5 mcg-100 mcg/inh inhalation aerosol  2 puff(s) inhaled 2 times a day   magnesium gluconate 250 mg oral tablet  1 tab(s) orally 2 times a day   potassium gluconate 595 mg oral tablet  1 cap(s) orally once a day   cranberry - oral capsule   orally    calcium 600+d 600 mg-800 intl units oral tablet  1 tab(s) orally 2 times a day   vitamin d3 2000 intl units oral capsule  1 cap(s) orally once a day   ferrous sulfate 325 mg oral tablet  1 tab(s) orally 3 times a day   prednisone 10 mg oral tablet  Start at 60 mg and taper by 10 mg daily until complete   amoxicillin-clavulanate 875 mg-125 mg oral tablet  1 tab(s) orally every 12 hours x 5 days    STOP TAKING THE FOLLOWING MEDICATION(S):    ibuprofen  200 mg oral tablet: 2 tab(s) orally 3 times a day  Physician's Instructions:  Diet Low Sodium  Low Fat, Low Cholesterol   Activity Limitations As tolerated   Return to Work Not Applicable   Time frame for Follow Up Appointment 1-2 weeks  dennis chrismon   Time frame for Follow Up Appointment 2-4 weeks  herbon flemming     CHRISMON, DENNIS E(Family Physician):   TIME SPENT:  Total Time: Greater than 30 minutes   Electronic Signatures: Patricia PesaShah, Shakur Lembo S (MD)  (Signed 27-May-15 23:10)  Authored: ADMISSION DATE AND DIAGNOSIS, CHIEF COMPLAINT/HPI, Allergies, HOSPITAL COURSE, PHYSICAL EXAM ON DISCHARGE, DISCHARGE INSTRUCTIONS HOME MEDS, PATIENT INSTRUCTIONS, Follow Up Physician, TIME SPENT   Last Updated: 27-May-15 23:10 by Patricia PesaShah, Ayla Dunigan S (MD)

## 2014-06-05 NOTE — H&P (Signed)
PATIENT NAME:  Cynthia [name], Cynthia [name] MR#:  [id number] DATE OF BIRTH:  1926/01/01  DATE OF ADMISSION:  07/05/2013  REFERRING PHYSICIAN:  Dr. Carrie Mew.   PRIMARY CARE PHYSICIAN:   Simona Huh Chrismon, PA.    CHIEF COMPLAINT:  Shortness of breath and cough.   This is an 81 year old female with known history of chronic respiratory failure with COPD on 2 liters nasal cannula at baseline, hypothyroidism. The patient presents with complaints of shortness of breath, cough and productive sputum for 1 day, as well she reports she had fever of  around 101 at home, where she took Tylenol. Upon presentation, the patient was hypoxic on 2 liters nasal cannula, which is her baseline. She was saturating in the 70s or the low 80s on that. We increased her oxygen demand to 6 liters with improvement of her saturation. The patient had basic workup done including chest x-ray, which did show bibasilar opacities. This is suspicious for pneumonia, as well the patient's blood work did show acute renal failure with creatinine of 1.59 with normal baseline creatinine. As well, she had leukocytosis of 17.5. She was tachycardic upon presentation at 118,and febrile 100.2.  Urinalysis is still pending. The patient was started on levofloxacin in the ED, where she had local rash at site, where currently she is being switched to azithromycin and Rocephin. The patient presents with complaints of generalized weakness and decreased appetite. Hospitalist services are requested to admit the patient.   PAST MEDICAL HISTORY: 1.  COPD.  2.  Chronic respiratory failure on 3 liters nasal cannula.  3.  Coronary artery disease.  4.  Chronic anemia.  5.  Osteoarthritis.  6.  Chronic low back pain.  8.  Hypothyroidism.  9.  Status post hysterectomy.   HOME MEDICATIONS:  1.  Spiriva 1 capsule inhalational daily.  2.  Prevacid 15 mg oral daily.  3.  Proventil as needed.  4.  Synthroid 0.375 mg oral daily.  5.  Ibuprofen as needed.  6.  Aspirin  81 mg daily.  7.  Advair 500/50, 1 puff b.i.d.  8.  DuoNeb 3 times a day.  9.  Two liters nasal cannula oxygen.   ALLERGIES:  CODEINE, VICODIN, PERCOCET AND ULTRAM.   SOCIAL HISTORY: Quit smoking several years ago. No alcohol. No illicit drug use.   FAMILY HISTORY: Significant for hypertension and coronary artery disease.   REVIEW OF SYSTEMS:   CONSTITUTIONAL:  Reports fever, chills, fatigue, weakness.  EYES: Denies blurry vision, double vision or inflammation.  EAR, NOSE, THROAT: Denies tinnitus, ear pain, hearing loss, epistaxis.  RESPIRATORY: Reports cough, productive sputum, shortness of breath and history of COPD.  CARDIOVASCULAR: Denies chest pain, edema, palpitation, syncope.  GASTROINTESTINAL: Denies nausea, vomiting, diarrhea, abdominal pain, hematemesis.  GENITOURINARY: Denies dysuria or hematuria.  ENDOCRINE:   Denies polyuria, polydipsia, heat or cold intolerance.  HEMATOLOGY: Denies anemia, easy bruising, bleeding diathesis.  INTEGUMENT: Denies acne, rash or skin lesion.  MUSCULOSKELETAL: Denies any gout, swelling, cramps.  NEUROLOGIC: Denies history of CVA, TIA, ataxia, tremors, vertigo.  PSYCHIATRIC: Denies anxiety, insomnia or depression.   PHYSICAL EXAMINATION: VITAL SIGNS: Temperature 100.2, pulse 100, respiratory rate 22, blood pressure 107/46, saturating 96% on oxygen 3 liters nasal cannula.  GENERAL:  Frail, elderly female, looks comfortable, in no apparent distress.  HEENT: Head atraumatic, normocephalic. Pupils equal, reactive to light. Pink conjunctivae. Anicteric sclerae. Dry oral mucosa.  NECK: Supple. No thyromegaly. No JVD.  CHEST:  Decreased air entry bilaterally with bibasilar rales and rhonchi.  No wheezing.  CARDIOVASCULAR: S1, S2 heard. No rubs, murmur or gallops.  ABDOMEN: Soft, nontender, nondistended. Bowel sounds present.  EXTREMITIES: No edema. No clubbing. No cyanosis.  PSYCHIATRIC: Appropriate affect. Awake, alert x 3. Intact judgment and  insight.  NEUROLOGIC: Cranial nerves grossly intact. Motor 5/5.  No focal deficits.  MUSCULOSKELETAL:  No joint effusion or erythema.   PERTINENT LABORATORY DATA: Glucose 128. BUN 26, creatinine 1.59, sodium 133, potassium 3.9, chloride 101, CO2 of 27, ALT 32, AST 33, alk phos 77. Troponin less than 0.01. White blood cells 17.5, hemoglobin 12.7, hematocrit 38.1, platelets 174.   IMAGING: Chest x-ray showing mild bibasilar airspace opacities. There is concern for pneumonia.   ASSESSMENT AND PLAN: 1.  Acute hypoxic respiratory failure. The patient's baseline oxygen is 2; currently, she is requiring 3. Upon presentation, she was on 6 liters nasal cannula. This is due to COPD and pneumonia.  2. Sepsis. The patient is febrile, with tachycardia and leukocytosis; this is related to her pneumonia. We will follow on urinalysis and blood cultures.  3.  Pneumonia. The patient will be started on Rocephin and azithromycin for community-acquired pneumonia.  4.  Chronic obstructive pulmonary disease exacerbation. The patient had significant decreased air entry on auscultation. Will be started on Solu-Medrol and nebulizers. We will continue her Spiriva and Advair.  5. Acute renal failure due to dehydration and volume depletion. We will continue with gentle hydration.  6.  Coronary artery disease. Denies any chest pain. Continue with aspirin.  7.  Hypothyroidism. Continue with Synthroid.  8.  Deep vein thrombosis prophylaxis, subQ heparin.   CODE STATUS: Discussed with the patient and her sister at bedside. The patient has a LIVING WILL. The patient's niece is her healthcare power of attorney. The patient reports she is DNR.  TOTAL TIME SPENT ON ADMISSION AND PATIENT CARE: 55 minutes.      ____________________________ Albertine Patricia, MD dse:dmm D: 07/05/2013 06:57:00 ET T: 07/05/2013 11:52:58 ET JOB#: [id number]  cc: Albertine Patricia, MD, <Dictator> Liisa Picone Graciela Husbands MD ELECTRONICALLY SIGNED  07/08/2013 23:32

## 2014-06-06 NOTE — H&P (Signed)
PATIENT NAME:  Cynthia [name], Cynthia [name] MR#:  [id number] DATE OF BIRTH:  [birth year]  DATE OF ADMISSION:  06/10/2011  REFERRING PHYSICIAN: Pamala Duffelobb Wiegand, MD  PRIMARY CARE PHYSICIAN: Jodell CiproDennis E. Chrismon, PA  REASON FOR ADMISSION: Chest pain worrisome for unstable angina.   HISTORY OF PRESENT ILLNESS: The patient is an 81 year old female with [name] history of two-vessel coronary artery disease being medically managed, also has [name] history of chronic obstructive pulmonary disease and hypothyroidism and hyperlipidemia. She presents to the Emergency Room with substernal chest pain radiating to the left shoulder associated with worsening shortness of breath. Symptoms occurred at rest. She took two sublingual nitroglycerin at home with no improvement of her pain. She presented to the Emergency Room and is now admitted for further evaluation. She has been treated recently for pneumonia and is completing [name] steroid taper after completing [name] 10 day course of antibiotics.  PAST MEDICAL HISTORY:  1. Chronic obstructive pulmonary disease/asthma.  2. Remote history of tobacco abuse.  3. Two-vessel coronary artery disease being medically managed.  4. Chronic anemia.  5. Osteoarthritis.  6. Chronic low back pain.  7. Previous L spine surgery.  8. Hypothyroidism.  9. Status post hysterectomy.   MEDICATIONS:  1. Spiriva 1 capsule inhaled daily.  2. Prevacid 15 mg p.o. daily.  3. Proventil 2 puffs every four hours p.r.n. shortness of breath.  4. Synthroid 0.0375 mg p.o. daily.  5. Ibuprofen 400 mg p.o. three times daily. 6. Aspirin 81 mg p.o. daily.  7. Advair 500/50 one puff twice [name] day. 8. DuoNeb SVNs three times daily. 9. Oxygen at 2 liters per minute per nasal cannula.   ALLERGIES: Codeine, Vicodin, Percocet, and Ultram.   SOCIAL HISTORY: The patient quit smoking several years ago. No history of alcohol abuse.   FAMILY HISTORY: Positive for hypertension and coronary artery disease.   REVIEW OF SYSTEMS:  CONSTITUTIONAL: No fever or change in weight. EYES: No blurred or double vision. No glaucoma. ENT: No tinnitus or hearing loss. No nasal discharge or bleeding. No difficulty swallowing. RESPIRATORY: The patient has had cough and wheezing but denies hemoptysis. No painful respiration. CARDIOVASCULAR: No orthopnea. Denies palpitations or syncope. GI: No nausea, vomiting, or diarrhea. No abdominal pain. No change in bowel habits. GU: No dysuria or hematuria. No incontinence. ENDOCRINE: No polyuria or polydipsia. No heat or cold intolerance. HEMATOLOGIC: The patient admits to anemia but denies easy bruising or bleeding. LYMPHATIC: No swollen glands. MUSCULOSKELETAL: The patient denies pain in her neck, shoulders, knees, or hips. Does have back pain. No gout. NEUROLOGIC: The patient denies numbness, although she does feel weak. Denies migraines, stroke, or seizures. PSYCH: The patient denies anxiety, insomnia, or depression.   PHYSICAL EXAMINATION:   GENERAL: The patient is acutely ill-appearing, in moderate respiratory distress.   VITAL SIGNS: Vital signs are remarkable for [name] blood pressure of 119/84 with [name] heart rate of 76 and [name] respiratory rate of 28. She is afebrile.   HEENT: Normocephalic, atraumatic. Pupils are equally round and reactive to light and accommodation. Extraocular movements are intact. Sclerae are anicteric. Conjunctivae are clear. Oropharynx is dry but clear.   NECK: Supple without jugular venous distention. No adenopathy or thyromegaly was noted.   LUNGS: Decreased breath sounds with diffuse wheezes and rhonchi. No rales. No dullness.   HEART: Regular rate and rhythm with normal S1 and S2. No significant rubs, murmurs, or gallops. PMI is nondisplaced. Chest wall is nontender.   ABDOMEN: Soft and nontender with normoactive bowel sounds. No  organomegaly or masses were appreciated. No hernias or bruits were noted.   EXTREMITIES: No clubbing, cyanosis, or edema. Pulses were 1+  bilaterally.   SKIN: Warm and dry without rash or lesions.   NEUROLOGIC: Cranial nerves II through XII grossly intact. Deep tendon reflexes were symmetric. Motor and sensory examination is nonfocal.   PSYCH: The patient was alert and oriented to person, place, and time. She was cooperative and used good judgment.   LABS/STUDIES: D-dimer was elevated at 0.52. Troponin was 0.02. Total CK was 73 with an MB of 3.5. Glucose was 101 with [name] BUN of 35 and [name] creatinine of 1.11 with [name] GFR of 46. White count was 9.6 with [name] hemoglobin of 11.5.   Chest x-ray showed no acute disease.   EKG revealed sinus rhythm with no acute ischemic changes.   ASSESSMENT:  1. Chest pain worrisome for unstable angina.  2. Two-vessel coronary artery disease by cardiac catheterization.  3. Chronic obstructive pulmonary disease exacerbation.  4. Chronic anemia.  5. Hypothyroidism.  6. Hyperglycemia.  7. Renal insufficiency.   PLAN: The patient will be admitted to telemetry as [name] FULL CODE with Lovenox and aspirin. We will use nitroglycerin sublingually as needed for chest pain and morphine if needed. We will follow serial cardiac enzymes and obtain an echocardiogram. We will also consult her cardiologist, Dr. Welton Flakes, for further evaluation of her significant heart disease and chest pain. We will begin IV steroids with empiric IV antibiotics and Xopenex and Atrovent SVNs for her chronic obstructive pulmonary disease exacerbation. We will continue her Advair and Spiriva. We will continue Synthroid. Check [name] TSH in the morning. Because of the elevated d-dimer, we will hydrate with IV fluids hoping to improve her renal function. Continue therapeutic Lovenox for now. Chest CT in the morning assuming her renal function is better. We will follow her sugars with Accu-Cheks before meals and at bedtime and add sliding scale insulin as needed. Further treatment and evaluation will depend upon the patient's progress.   TOTAL TIME SPENT ON  ADMISSION: 50 minutes. ____________________________ Duane Lope Judithann Sheen, MD jds:slb D: 06/10/2011 06:35:30 ET T: 06/10/2011 10:07:30 ET JOB#: [id number]  cc: Duane Lope. Judithann Sheen, MD, <Dictator> Jodell Cipro. Chrismon, PA Ross Bender D Claude Swendsen MD ELECTRONICALLY SIGNED 06/10/2011 20:15

## 2014-06-06 NOTE — Discharge Summary (Signed)
PATIENT NAME:  [name], Kerington A MR#:  [id number] DATE OF BIRTH:  Nov 10, 1925  DATE OF ADMISSION:  06/10/2011 DATE OF DISCHARGE:  06/11/2011  DISCHARGE DIAGNOSES:  1. Anginal type chest pain.  2. Chronic obstructive pulmonary disease exacerbation.  3. Dehydration.   PROCEDURE: None.  CONDITION: Stable.   HOME MEDICATIONS: [facility].   ADDITIONAL MEDICATIONS:  1. Zocor 40 mg p.o. at bedtime.  2. Prednisone 40 mg p.o. daily then taper.    PHYSICIAN INSTRUCTIONS: Diet - low sodium diet. Activity - as tolerated.   FOLLOWUP CARE: Followup with PCP within 1 to 2 weeks. Followup with cardiology, Dr. Park BreedKahn, tomorrow at 3:00 p.m.   REASON FOR ADMISSION: Chest pain worrisome for unstable angina.   CONSULTANTS: Adrian BlackwaterShaukat Khan, MD - Cardiology.  HOSPITAL COURSE: The patient is an 81 year old Caucasian female with a history of coronary artery disease, chronic obstructive pulmonary disease, hypothyroidism, and hyperlipidemia who presented to the ED with substernal chest radiating to the left shoulder associated with worsening shortness of breath. For a detailed history and physical examination, please refer to the admission note dictated by Dr. Judithann SheenSparks.  On admission data, laboratories showed d-dimer was 0.52. Troponin was 0.02. BUN was 35, and creatinine 1.11. WBC was 9.6 and hemoglobin 11.5. Chest x-ray showed no acute disease. EKG showed sinus rhythm without ischemic changes.   The patient was admitted for chest pain worrisome for unstable angina, coronary artery disease, and chronic obstructive pulmonary disease exacerbation. After admission the patient has been treated with antibiotics, Solu-Medrol, and nebulizer p.r.n. In addition, the patient has been placed on aspirin and Zocor. Since the patient's d-dimer is elevated, CT angio was done which showed no pulmonary embolus. Echocardiogram showed ejection fraction more than 55%. Dr. Welton FlakesKhan, cardiologist, evaluated the  patient today and suggested the patient can be discharged and followup in his outpatient office tomorrow. The patient has no ACS, no myocardial infarction, and it is anginal type chest pain. Today the patient has no complaints.   PHYSICAL EXAMINATION:   VITALS: Temperature 98.1, blood pressure 166/72, pulse 65, and oxygen saturation 97%.   CARDIOVASCULAR: S1 and S2 regular rate and rhythm. No murmurs or gallops.   PULMONARY: Bilateral air entry. No wheezing or rales.   ABDOMEN: Soft. No distention. No tenderness.   EXTREMITIES: No edema, clubbing, or cyanosis.   The patient is clinically stable and will be discharged to home today. I discussed the patient's discharge plan with the patient, the nurse, and the case manager.   TIME SPENT: About 40 minutes.  ____________________________ Shaune PollackQing Thoren Hosang, MD qc:slb D: 06/11/2011 13:44:22 ET T: 06/12/2011 09:52:46 ET JOB#: [id number]  cc: Shaune PollackQing Mace Weinberg, MD, <Dictator> Shaune PollackQING Celestine Bougie MD ELECTRONICALLY SIGNED 06/12/2011 16:05

## 2014-06-06 NOTE — Consult Note (Signed)
PATIENT NAME:  Cynthia [name], Cynthia [name] MR#:  [id number] DATE OF BIRTH:  06-Dec-1925  DATE OF CONSULTATION:  06/11/2011  CONSULTING PHYSICIAN:  Laurier NancyShaukat [name]. Khrystina Bonnes, MD  INDICATION FOR CONSULTATION: Chest pain.   HISTORY OF PRESENT ILLNESS: The patient is an 81 year old white female who is well known to me with history of  two vessel-coronary artery disease, chronic obstructive pulmonary disease, hypothyroidism, hyperlipidemia, came in with chest pain. The chest pain was relieved with nitroglycerin. She ruled out for myocardial infarction. Right now she is chest pain free.   PAST MEDICAL HISTORY:  1. History of chronic obstructive pulmonary disease. 2. History of chronic anemia.  3. History of hypothyroidism.   ALLERGIES: Codeine, Vicodin, Percocet, and Ultram.   SOCIAL HISTORY: Unremarkable.   FAMILY HISTORY: Positive for coronary artery disease.   PHYSICAL EXAMINATION:  GENERAL: She is alert, oriented x3, in no acute distress.   VITAL SIGNS: Stable.   NECK: No JVD.   LUNGS: Clear.   HEART: Regular rate and rhythm. Normal S1, S2. No audible murmur.   ABDOMEN: Soft, nontender, positive bowel sounds.   EXTREMITIES: No pedal edema.   LABORATORY AND DIAGNOSTIC DATA: EKG shows normal sinus rhythm, T wave inversion in the anterior leads. Cardiac enzymes were negative.   ASSESSMENT AND PLAN:  Anginal type chest pain: Myocardial infarction has been ruled out. She had [name] CT scan. If that is negative, the patient can be discharged with follow-up in the office tomorrow at 3:30 p.m. I discussed with the patient. She is chest pain free. I advised follow-up tomorrow at 3:30 p.m. She can be discharged.   ____________________________ Laurier NancyShaukat [name]. Abrham Maslowski, MD sak:cbb D: 06/11/2011 09:29:38 ET T: 06/11/2011 09:45:44 ET JOB#: [id number]  cc: Laurier NancyShaukat [name]. Anaika Santillano, MD, <Dictator> Laurier NancySHAUKAT [name] Katlyn Muldrew MD ELECTRONICALLY SIGNED 06/19/2011 16:44

## 2014-06-08 DIAGNOSIS — Z1389 Encounter for screening for other disorder: Secondary | ICD-10-CM | POA: Diagnosis not present

## 2014-06-08 DIAGNOSIS — N390 Urinary tract infection, site not specified: Secondary | ICD-10-CM | POA: Diagnosis not present

## 2014-06-08 DIAGNOSIS — M810 Age-related osteoporosis without current pathological fracture: Secondary | ICD-10-CM | POA: Diagnosis not present

## 2014-06-08 DIAGNOSIS — J432 Centrilobular emphysema: Secondary | ICD-10-CM | POA: Diagnosis not present

## 2014-06-08 DIAGNOSIS — Z9049 Acquired absence of other specified parts of digestive tract: Secondary | ICD-10-CM | POA: Diagnosis not present

## 2014-06-08 DIAGNOSIS — E780 Pure hypercholesterolemia: Secondary | ICD-10-CM | POA: Diagnosis not present

## 2014-06-22 DIAGNOSIS — R351 Nocturia: Secondary | ICD-10-CM | POA: Diagnosis not present

## 2014-06-22 DIAGNOSIS — N952 Postmenopausal atrophic vaginitis: Secondary | ICD-10-CM | POA: Diagnosis not present

## 2014-06-22 DIAGNOSIS — N390 Urinary tract infection, site not specified: Secondary | ICD-10-CM | POA: Diagnosis not present

## 2014-07-05 DIAGNOSIS — J449 Chronic obstructive pulmonary disease, unspecified: Secondary | ICD-10-CM | POA: Diagnosis not present

## 2014-07-07 DIAGNOSIS — N390 Urinary tract infection, site not specified: Secondary | ICD-10-CM | POA: Diagnosis not present

## 2014-07-07 DIAGNOSIS — N952 Postmenopausal atrophic vaginitis: Secondary | ICD-10-CM | POA: Diagnosis not present

## 2014-07-07 DIAGNOSIS — R351 Nocturia: Secondary | ICD-10-CM | POA: Diagnosis not present

## 2014-07-26 ENCOUNTER — Encounter: Payer: Self-pay | Admitting: *Deleted

## 2014-07-26 ENCOUNTER — Other Ambulatory Visit: Payer: Self-pay | Admitting: Urology

## 2014-07-26 DIAGNOSIS — N390 Urinary tract infection, site not specified: Secondary | ICD-10-CM

## 2014-07-27 ENCOUNTER — Ambulatory Visit (INDEPENDENT_AMBULATORY_CARE_PROVIDER_SITE_OTHER): Payer: Commercial Managed Care - HMO | Admitting: Urology

## 2014-07-27 ENCOUNTER — Encounter: Payer: Self-pay | Admitting: Urology

## 2014-07-27 VITALS — BP 156/81 | HR 76 | Ht 62.0 in | Wt 127.9 lb

## 2014-07-27 DIAGNOSIS — N390 Urinary tract infection, site not specified: Secondary | ICD-10-CM | POA: Diagnosis not present

## 2014-07-27 DIAGNOSIS — R300 Dysuria: Secondary | ICD-10-CM

## 2014-07-27 LAB — BLADDER SCAN AMB NON-IMAGING: Scan Result: 0

## 2014-07-27 MED ORDER — NITROFURANTOIN MONOHYD MACRO 100 MG PO CAPS: 100.0000 mg | ORAL_CAPSULE | Freq: Two times a day (BID) | ORAL | Status: DC

## 2014-07-27 NOTE — Progress Notes (Signed)
07/27/2014 3:06 PM   Cynthia [name] [birth year] [id number]  Referring provider: Malva Limes, MD [address] [place], [place] [address]  Chief Complaint  Patient presents with  . urinary symptoms    dysuria, urinary frequency  . vaginal atrophy    estrace samples were given patient states better    HPI: Cynthia [name] is a 81 y/o white female who presents today for dysuria and urinary frequency.  She states a week ago, she started to have malodorous urine.  Her symptoms then progressed to dysuria and frequency.  She had associated fever and nocturia, but she denied any gross hematuria, suprapubic pain, chills, nausea or vomiting.  She also denies pneumaturia and stool in the urine.    She was started on vaginal estrogen cream for vaginal atrophy at her initial visit with [place].  She experienced vaginal itching, so we switched brands of creams.  The Estrace cream does not irritate her vaginal mucosa. Her UCx was negative at her visit on 06/22/2014.   She reports an UTI in September 2015, December 2015 and in April 2016. All +UCx with E. coli. She has a hx of a fistulas and has had two repairs. One was in [place] in 2002 and one was with Renda Rolls, M.D. in 2006. She had a CT scan w in January and which only noted constipation and a hiatal hernia.   She has no urinary leakage, she does not wear pads, nocturia x 15 and only 3 times during the day. She is not interested in a sleep study.   PMH: Past Medical History  Diagnosis Date  . Abdominal pain, epigastric   . Aneurysm of aorta   . Osteoarthritis   . GERD (gastroesophageal reflux disease)   . Spinal stenosis of lumbar region without neurogenic claudication   . Insomnia   . Hypercholesteremia   . Osteoporosis   . Hypertension   . Hyperthyroidism   . Stomatitis monilial   . CAD (coronary artery disease)   . Renal failure   . UTI symptoms   . Stroke   . Allergic drug reaction   . Breathlessness on exertion     . Asthma   . COPD (chronic obstructive pulmonary disease)   . Seborrheic keratoses   . Olecranon bursitis   . History of partial colectomy   . Pneumonia   . Hypoxia   . Nocturia   . Vaginal atrophy     Surgical History: Past Surgical History  Procedure Laterality Date  . Multilevel spinal fusion and laminectomy  2010    Due to spinal stenosis Dr. Gerrit Heck  . Rotator cuff surgery  2007  . Vaginal hysterectomy  1960  . Wrist surgery      lump removed due to pain  . Colon surgery      Hole in colon that was repaired  . Appendectomy      Home Medications:    Medication List       This list is accurate as of: 07/27/14 11:59 PM.  Always use your most recent med list.               acetaminophen 500 MG tablet  Commonly known as:  TYLENOL  Take 500 mg by mouth every 6 (six) hours as needed.     albuterol (2.5 MG/3ML) 0.083% nebulizer solution  Commonly known as:  PROVENTIL  Take 2.5 mg by nebulization every 6 (six) hours as needed for wheezing or shortness of breath.  albuterol 108 (90 BASE) MCG/ACT inhaler  Commonly known as:  PROVENTIL HFA;VENTOLIN HFA  Inhale into the lungs every 6 (six) hours as needed for wheezing or shortness of breath.     ALPRAZolam 0.5 MG tablet  Commonly known as:  XANAX  Take 0.5 mg by mouth at bedtime as needed for anxiety.     aspirin EC 81 MG tablet  Take 81 mg by mouth daily.     Benzonatate 150 MG Caps  Take 100 mg by mouth.     calcium carbonate 600 MG Tabs tablet  Commonly known as:  OS-CAL  Take 600 mg by mouth 2 (two) times daily with a meal.     Cranberry Fruit Concentrate 12600 MG Caps  Take by mouth daily.     DULERA 100-5 MCG/ACT Aero  Generic drug:  mometasone-formoterol  Inhale 2 puffs into the lungs 2 (two) times daily.     ESTRACE VAGINAL 0.1 MG/GM vaginal cream  Generic drug:  estradiol  Place 1 Applicatorful vaginally at bedtime.     fexofenadine 180 MG tablet  Commonly known as:  ALLEGRA  Take 180 mg  by mouth daily.     Fish Oil 1000 MG Caps  Take by mouth daily.     ipratropium 0.02 % nebulizer solution  Commonly known as:  ATROVENT  Take 0.5 mg by nebulization 4 (four) times daily.     Iron 28 MG Tabs  Take 28 mg by mouth daily.     isosorbide mononitrate 30 MG 24 hr tablet  Commonly known as:  IMDUR  Take by mouth.     lansoprazole 30 MG capsule  Commonly known as:  PREVACID  Take 30 mg by mouth daily at 12 noon.     levothyroxine 75 MCG tablet  Commonly known as:  SYNTHROID, LEVOTHROID  Take 75 mcg by mouth daily before breakfast.     Magnesium Gluconate 250 MG Tabs  Take by mouth 2 (two) times daily.     Melatonin 5 MG Caps  Take by mouth at bedtime.     meloxicam 15 MG tablet  Commonly known as:  MOBIC  Take 15 mg by mouth daily.     MULTI-VITAMINS Tabs  Take by mouth.     nitrofurantoin (macrocrystal-monohydrate) 100 MG capsule  Commonly known as:  MACROBID  Take 1 capsule (100 mg total) by mouth every 12 (twelve) hours.     nitroGLYCERIN 0.4 MG SL tablet  Commonly known as:  NITROSTAT  Place 0.4 mg under the tongue every 5 (five) minutes as needed for chest pain.     OXYGEN  Inhale into the lungs as needed (1  2 LPM Inhalation PRN).     Potassium Gluconate 2 MEQ Tabs  Take by mouth daily.     sucralfate 1 G tablet  Commonly known as:  CARAFATE  Take by mouth.     tiotropium 18 MCG inhalation capsule  Commonly known as:  SPIRIVA  Place into inhaler and inhale.     vitamin C 500 MG tablet  Commonly known as:  ASCORBIC ACID  Take 500 mg by mouth daily.        Allergies:  Allergies  Allergen Reactions  . Levaquin [Levofloxacin] Rash  . Codeine Itching  . Crestor [Rosuvastatin] Other (See Comments)    Dizziness   . Duratuss G [Guaifenesin] Nausea And Vomiting    Family History: No family history on file.  Social History:  reports that she has quit smoking. She  does not have any smokeless tobacco history on file. She reports that  she drinks alcohol. She reports that she does not use illicit drugs.  ROS: Urological Symptom Review  Patient is experiencing the following symptoms: Urinary tract infection   Review of Systems  Gastrointestinal (upper)  : Indigestion/heartburn  Gastrointestinal (lower) : Negative for lower GI symptoms  Constitutional : Negative for symptoms  Skin: Negative for skin symptoms  Eyes: Negative for eye symptoms  Ear/Nose/Throat : Negative for Ear/Nose/Throat symptoms  Hematologic/Lymphatic: Negative for Hematologic/Lymphatic symptoms  Cardiovascular : Chest pain  Respiratory : Cough Shortness of breath  Endocrine: Negative for endocrine symptoms  Musculoskeletal: Negative for musculoskeletal symptoms  Neurological: Negative for neurological symptoms  Psychologic: Depression Anxiety   Physical Exam: BP 156/81 mmHg  Pulse 76  Ht  (1.575 m)  Wt 127 lb 14.4 oz (58.015 kg)  BMI 23.39 kg/m2   Laboratory Data: Results for orders placed or performed in visit on 07/27/14  CULTURE, URINE COMPREHENSIVE  Result Value Ref Range   Result 1 Escherichia coli (A)   Microscopic Examination  Result Value Ref Range   WBC, UA 11-30 (A) 0 -  5 /hpf   RBC, UA 0-2 0 -  2 /hpf   Epithelial Cells (non renal) 0-10 0 - 10 /hpf   Renal Epithel, UA 0-10 (A) None seen /hpf   Bacteria, UA Many (A) None seen/Few  Urinalysis, Complete  Result Value Ref Range   Specific Gravity, UA 1.015 1.005 - 1.030   pH, UA 7.0 5.0 - 7.5   Color, UA Yellow Yellow   Appearance Ur Hazy (A) Clear   Leukocytes, UA 2+ (A) Negative   Protein, UA Negative Negative/Trace   Glucose, UA Negative Negative   Ketones, UA Negative Negative   RBC, UA Trace (A) Negative   Bilirubin, UA Negative Negative   Urobilinogen, Ur 0.2 0.2 - 1.0 mg/dL   Nitrite, UA Negative Negative   Microscopic Examination See below:   BLADDER SCAN AMB NON-IMAGING  Result Value Ref Range   Scan Result 0    Lab  Results  Component Value Date   WBC 16.0* 01/27/2014   HGB 11.7* 01/27/2014   HCT 36.5 01/27/2014   MCV 96 01/27/2014   PLT 198 01/27/2014    Lab Results  Component Value Date   CREATININE 1.48* 01/27/2014    No results found for: PSA  No results found for: TESTOSTERONE  No results found for: HGBA1C  Urinalysis    Component Value Date/Time   GLUCOSEU Negative 07/27/2014 1529   BILIRUBINUR Negative 07/27/2014 1529   NITRITE Negative 07/27/2014 1529   LEUKOCYTESUR 2+* 07/27/2014 1529    Pertinent Imaging: Results for LAQUONDA, WELBY (MRN [id number]) as of 07/29/2014 14:52  Ref. Range 07/27/2014 15:38  Scan Result Unknown 0    Assessment & Plan:    1. Dysuria-  Patient's UA had 11-30 WBC's/hpf and many bacteria today.  We will send it for UCx.  I have empirically prescribed Macrobid.    - BLADDER SCAN AMB NON-IMAGING  2. Recurrent UTI-  Patient has a h/o recurrent UTI's.  But hopefully with the use of the vaginal estrogen cream, these infections will decrease.  In the future, the patient will be cathed for urine specimens if she has symptoms of infection.    - Urinalysis, Complete - CULTURE, URINE COMPREHENSIVE - nitrofurantoin, macrocrystal-monohydrate, (MACROBID) 100 MG capsule; Take 1 capsule (100 mg total) by mouth every 12 (twelve) hours.  Dispense: 14  capsule; Refill: 0  3. Atrophic vaginitis-  She will continue the Estrace cream three nights weekly and follow up in three months.  No Follow-up on file.  Michiel Cowboy, PA-C  [facility] [address], [address] [place], [place] [address] [phone]

## 2014-07-28 LAB — URINALYSIS, COMPLETE
Bilirubin, UA: NEGATIVE
Glucose, UA: NEGATIVE
Ketones, UA: NEGATIVE
NITRITE UA: NEGATIVE
PH UA: 7 (ref 5.0–7.5)
Protein, UA: NEGATIVE
SPEC GRAV UA: 1.015 (ref 1.005–1.030)
UUROB: 0.2 mg/dL (ref 0.2–1.0)

## 2014-07-28 LAB — MICROSCOPIC EXAMINATION

## 2014-07-29 DIAGNOSIS — Z8709 Personal history of other diseases of the respiratory system: Secondary | ICD-10-CM | POA: Insufficient documentation

## 2014-07-29 LAB — CULTURE, URINE COMPREHENSIVE

## 2014-07-30 ENCOUNTER — Telehealth: Payer: Self-pay

## 2014-07-30 NOTE — Telephone Encounter (Signed)
Spoke to [place], pt niece, in reference to urine cx results. Arline Asp stated pt is still burning and not getting any relief from macrobid. Nurse told Arline Asp to get some otc pyridium and continue abt. If pt still does not get any relief to call Monday. Cw,lpn

## 2014-07-30 NOTE — Telephone Encounter (Signed)
-----   Message from Harle Battiest, PA-C sent at 07/30/2014  8:35 AM EDT ----- Patient has a +UCx.  She needs to continue the Macrobid and we need to check a cath specimen 3 to 5 days after she completes the antibiotic.

## 2014-07-30 NOTE — Telephone Encounter (Signed)
-----   Message from Shannon A McGowan, PA-C sent at 07/30/2014  8:35 AM EDT ----- Patient has a +UCx.  She needs to continue the Macrobid and we need to check a cath specimen 3 to 5 days after she completes the antibiotic. 

## 2014-07-30 NOTE — Telephone Encounter (Signed)
LMOM

## 2014-08-05 DIAGNOSIS — J449 Chronic obstructive pulmonary disease, unspecified: Secondary | ICD-10-CM | POA: Diagnosis not present

## 2014-08-26 DIAGNOSIS — N3090 Cystitis, unspecified without hematuria: Secondary | ICD-10-CM | POA: Diagnosis not present

## 2014-08-26 DIAGNOSIS — R300 Dysuria: Secondary | ICD-10-CM | POA: Diagnosis not present

## 2014-08-30 DIAGNOSIS — R0602 Shortness of breath: Secondary | ICD-10-CM | POA: Diagnosis not present

## 2014-08-30 DIAGNOSIS — J440 Chronic obstructive pulmonary disease with acute lower respiratory infection: Secondary | ICD-10-CM | POA: Diagnosis not present

## 2014-08-30 DIAGNOSIS — J439 Emphysema, unspecified: Secondary | ICD-10-CM | POA: Diagnosis not present

## 2014-09-04 DIAGNOSIS — J449 Chronic obstructive pulmonary disease, unspecified: Secondary | ICD-10-CM | POA: Diagnosis not present

## 2014-09-20 DIAGNOSIS — J449 Chronic obstructive pulmonary disease, unspecified: Secondary | ICD-10-CM | POA: Diagnosis not present

## 2014-09-20 DIAGNOSIS — R0602 Shortness of breath: Secondary | ICD-10-CM | POA: Diagnosis not present

## 2014-09-20 DIAGNOSIS — R079 Chest pain, unspecified: Secondary | ICD-10-CM | POA: Diagnosis not present

## 2014-10-05 DIAGNOSIS — J449 Chronic obstructive pulmonary disease, unspecified: Secondary | ICD-10-CM | POA: Diagnosis not present

## 2014-10-07 ENCOUNTER — Ambulatory Visit: Payer: Self-pay | Admitting: Urology

## 2014-11-04 DIAGNOSIS — S46111A Strain of muscle, fascia and tendon of long head of biceps, right arm, initial encounter: Secondary | ICD-10-CM | POA: Diagnosis not present

## 2014-11-05 DIAGNOSIS — J449 Chronic obstructive pulmonary disease, unspecified: Secondary | ICD-10-CM | POA: Diagnosis not present

## 2014-11-11 ENCOUNTER — Other Ambulatory Visit: Payer: Self-pay

## 2014-11-11 DIAGNOSIS — J432 Centrilobular emphysema: Secondary | ICD-10-CM | POA: Insufficient documentation

## 2014-11-11 DIAGNOSIS — L821 Other seborrheic keratosis: Secondary | ICD-10-CM | POA: Insufficient documentation

## 2014-11-11 DIAGNOSIS — R0902 Hypoxemia: Secondary | ICD-10-CM | POA: Insufficient documentation

## 2014-11-11 DIAGNOSIS — E039 Hypothyroidism, unspecified: Secondary | ICD-10-CM | POA: Insufficient documentation

## 2014-11-11 DIAGNOSIS — Z9049 Acquired absence of other specified parts of digestive tract: Secondary | ICD-10-CM | POA: Insufficient documentation

## 2014-11-11 DIAGNOSIS — J449 Chronic obstructive pulmonary disease, unspecified: Secondary | ICD-10-CM | POA: Insufficient documentation

## 2014-11-11 DIAGNOSIS — I2510 Atherosclerotic heart disease of native coronary artery without angina pectoris: Secondary | ICD-10-CM | POA: Insufficient documentation

## 2014-11-11 DIAGNOSIS — M7030 Other bursitis of elbow, unspecified elbow: Secondary | ICD-10-CM | POA: Insufficient documentation

## 2014-11-11 DIAGNOSIS — Z79899 Other long term (current) drug therapy: Secondary | ICD-10-CM | POA: Insufficient documentation

## 2014-11-11 DIAGNOSIS — M169 Osteoarthritis of hip, unspecified: Secondary | ICD-10-CM | POA: Insufficient documentation

## 2014-11-15 ENCOUNTER — Encounter: Payer: Self-pay | Admitting: Family Medicine

## 2014-11-15 ENCOUNTER — Ambulatory Visit (INDEPENDENT_AMBULATORY_CARE_PROVIDER_SITE_OTHER): Payer: Commercial Managed Care - HMO | Admitting: Family Medicine

## 2014-11-15 VITALS — BP 166/78 | HR 81 | Temp 98.1°F | Resp 18 | Wt 132.0 lb

## 2014-11-15 DIAGNOSIS — Z23 Encounter for immunization: Secondary | ICD-10-CM | POA: Diagnosis not present

## 2014-11-15 DIAGNOSIS — J432 Centrilobular emphysema: Secondary | ICD-10-CM | POA: Diagnosis not present

## 2014-11-15 DIAGNOSIS — E039 Hypothyroidism, unspecified: Secondary | ICD-10-CM | POA: Diagnosis not present

## 2014-11-15 DIAGNOSIS — E7800 Pure hypercholesterolemia, unspecified: Secondary | ICD-10-CM

## 2014-11-15 NOTE — Progress Notes (Signed)
Patient ID: Cynthia [name], female   DOB: 05/15/1925, 81 y.o.   MRN: [id number]    Subjective:  Hyperlipidemia This is a chronic problem. The current episode started more than 1 year ago. Exacerbating diseases include hypothyroidism. Factors aggravating her hyperlipidemia include fatty foods. Pertinent negatives include no chest pain or myalgias.  Has stopped statins on her own after lipids normal in 2014. Recheck of lipids on 05-18-14 showed total cholesterol 271 with LDL 185. Wanted to try low fat diet and supplement of Krill Oil instead of statin. Presents to recheck blood levels.  Centriacinar Emphysema Followed by Dr. Welton Flakes (pulmonologist) 3 months ago and switched to [facility] from [facility]. This stopped all the stomach/abdominal muscle cramps. Still using oxygen 2 LPM at home or when needed for chronic respiratory failure/hypoxia. Still using Albuterol with Atrovent by nebulizer QID, Spiriva qd and Ventolin-HFA MDI prn when away from home prn rescue. Continues to have significant dyspnea frequently.  Prior to Admission medications   Medication Sig Start Date End Date Taking? Authorizing Provider  acetaminophen (TYLENOL) 500 MG tablet Take 500 mg by mouth every 6 (six) hours as needed.   Yes Historical Provider, MD  albuterol (PROVENTIL HFA;VENTOLIN HFA) 108 (90 BASE) MCG/ACT inhaler Inhale into the lungs every 6 (six) hours as needed for wheezing or shortness of breath.   Yes Historical Provider, MD  albuterol (PROVENTIL) (2.5 MG/3ML) 0.083% nebulizer solution Take 2.5 mg by nebulization every 6 (six) hours as needed for wheezing or shortness of breath.   Yes Historical Provider, MD  ALPRAZolam Prudy Feeler) 0.5 MG tablet Take 0.5 mg by mouth at bedtime as needed for anxiety.   Yes Historical Provider, MD  aspirin 81 MG tablet  02/10/07  Yes Historical Provider, MD  aspirin EC 81 MG tablet Take 81 mg by mouth daily.   Yes Historical Provider, MD  Benzonatate 150 MG CAPS Take 100 mg by mouth.   Yes Historical  Provider, MD  calcium carbonate (OS-CAL) 600 MG TABS tablet Take 600 mg by mouth 2 (two) times daily with a meal.   Yes Historical Provider, MD  Cranberry Fruit Concentrate 12600 MG CAPS Take by mouth daily.   Yes Historical Provider, MD  estradiol (ESTRACE VAGINAL) 0.1 MG/GM vaginal cream Place 1 Applicatorful vaginally at bedtime.   Yes Historical Provider, MD  Ferrous Sulfate (IRON) 28 MG TABS Take 28 mg by mouth daily.   Yes Historical Provider, MD  fexofenadine (ALLEGRA) 180 MG tablet Take 180 mg by mouth daily.   Yes Historical Provider, MD  ipratropium (ATROVENT) 0.02 % nebulizer solution Take 0.5 mg by nebulization 4 (four) times daily.   Yes Historical Provider, MD  isosorbide mononitrate (IMDUR) 30 MG 24 hr tablet Take by mouth.   Yes Historical Provider, MD  lansoprazole (PREVACID) 30 MG capsule Take 30 mg by mouth daily at 12 noon.   Yes Historical Provider, MD  levothyroxine (SYNTHROID, LEVOTHROID) 75 MCG tablet Take 75 mcg by mouth daily before breakfast.   Yes Historical Provider, MD  Magnesium Gluconate 250 MG TABS Take by mouth 2 (two) times daily.   Yes Historical Provider, MD  Melatonin 5 MG CAPS Take by mouth at bedtime.   Yes Historical Provider, MD  meloxicam (MOBIC) 15 MG tablet Take 15 mg by mouth daily.   Yes Historical Provider, MD  Multiple Vitamin (MULTI-VITAMINS) TABS Take by mouth.   Yes Historical Provider, MD  nitroGLYCERIN (NITROSTAT) 0.4 MG SL tablet Place 0.4 mg under the tongue every 5 (five) minutes  as needed for chest pain.   Yes Historical Provider, MD  Omega-3 Fatty Acids (FISH OIL) 1000 MG CAPS Take by mouth daily.   Yes Historical Provider, MD  OXYGEN Inhale into the lungs as needed (1  2 LPM Inhalation PRN).   Yes Historical Provider, MD  Potassium Gluconate 2 MEQ TABS Take by mouth daily.   Yes Historical Provider, MD  sucralfate (CARAFATE) 1 G tablet Take by mouth. 03/02/14  Yes Historical Provider, MD  tiotropium (SPIRIVA) 18 MCG inhalation capsule  Place into inhaler and inhale.   Yes Historical Provider, MD  vitamin C (ASCORBIC ACID) 500 MG tablet Take 500 mg by mouth daily.   Yes Historical Provider, MD    Patient Active Problem List   Diagnosis Date Noted  . Atherosclerosis of coronary artery 11/11/2014  . Centriacinar emphysema (HCC) 11/11/2014  . CAFL (chronic airflow limitation) (HCC) 11/11/2014  . Polypharmacy 11/11/2014  . H/O hemicolectomy 11/11/2014  . Adult hypothyroidism 11/11/2014  . Hypoxia 11/11/2014  . Bursitis of elbow 11/11/2014  . Degenerative arthritis of hip 11/11/2014  . Basal cell papilloma 11/11/2014  . H/O respiratory system disease 07/29/2014  . Inflammation of sacroiliac joint (HCC) 09/29/2013  . AA (aortic aneurysm) (HCC) 07/19/2009  . Arthritis of hand, degenerative 11/12/2008  . Acid reflux 11/27/2007  . Lumbar canal stenosis 09/16/2007  . Hypercholesteremia 01/31/2007  . BP (high blood pressure) 11/20/2005  . OP (osteoporosis) 11/20/2005    Past Medical History  Diagnosis Date  . Abdominal pain, epigastric   . Aneurysm of aorta (HCC)   . Osteoarthritis   . GERD (gastroesophageal reflux disease)   . Spinal stenosis of lumbar region without neurogenic claudication   . Insomnia   . Hypercholesteremia   . Osteoporosis   . Hypertension   . Hyperthyroidism   . Stomatitis monilial   . CAD (coronary artery disease)   . Renal failure   . UTI symptoms   . Stroke (HCC)   . Allergic drug reaction   . Breathlessness on exertion   . Asthma   . COPD (chronic obstructive pulmonary disease) (HCC)   . Seborrheic keratoses   . Olecranon bursitis   . History of partial colectomy   . Pneumonia   . Hypoxia   . Nocturia   . Vaginal atrophy     Social History   Social History  . Marital Status: Widowed    Spouse Name: N/A  . Number of Children: N/A  . Years of Education: N/A   Occupational History  . Not on file.   Social History Main Topics  . Smoking status: Former Games developer  .  Smokeless tobacco: Not on file     Comment: quit 11 years ago  . Alcohol Use: 0.0 oz/week    0 Standard drinks or equivalent per week     Comment: 2 oz of wine at night  . Drug Use: No  . Sexual Activity: Not on file   Other Topics Concern  . Not on file   Social History Narrative    Allergies  Allergen Reactions  . Nitrofurantoin Monohyd Macro Shortness Of Breath    ? Shortness of breath with Macrobid (has previously tolerated prior to June 2016)  . Levaquin [Levofloxacin] Rash  . Codeine Itching  . Crestor [Rosuvastatin] Other (See Comments)    Dizziness   . Duratuss G [Guaifenesin] Nausea And Vomiting  . Oxycodone-Acetaminophen Itching  . Propoxyphene Itching  . Tramadol Itching  . Morphine Itching and Rash  Review of Systems  Constitutional: Negative.   HENT: Negative.   Eyes: Negative.   Respiratory: Negative.   Cardiovascular: Negative.  Negative for chest pain.  Gastrointestinal: Negative.   Musculoskeletal: Negative.  Negative for myalgias.  Skin: Negative.   Neurological: Negative.   Endo/Heme/Allergies: Negative.   Psychiatric/Behavioral: Negative.     Immunization History  Administered Date(s) Administered  . Influenza-Unspecified 10/13/2013   Objective:  BP 166/78 mmHg  Pulse 81  Temp(Src) 98.1 F (36.7 C) (Oral)  Resp 18  Wt 132 lb (59.875 kg)  SpO2 92%  Physical Exam  Constitutional: She is oriented to person, place, and time and well-developed, well-nourished, and in no distress.  HENT:  Head: Normocephalic and atraumatic.  Eyes: Conjunctivae are normal.  Neck: Normal range of motion. Neck supple.  Cardiovascular: Normal rate and regular rhythm.   Pulmonary/Chest: She is in respiratory distress.  Very distant breath sounds with some wheeze occasionally today.  Abdominal: Soft. Bowel sounds are normal. There is no tenderness.  Musculoskeletal: Normal range of motion.  Neurological: She is alert and oriented to person, place, and  time.  Psychiatric: Affect and judgment normal.    Lab Results  Component Value Date   WBC 16.0* 01/27/2014   HGB 11.7* 01/27/2014   HCT 36.5 01/27/2014   PLT 198 01/27/2014   GLUCOSE 126* 01/27/2014   CHOL 201* 06/11/2011   TRIG 149 06/11/2011   HDL 55 06/11/2011   LDLCALC 116* 06/11/2011   HGBA1C 6.1 06/11/2011    CMP     Component Value Date/Time   NA 132* 01/27/2014 1716   K 4.2 01/27/2014 1716   CL 98 01/27/2014 1716   CO2 25 01/27/2014 1716   GLUCOSE 126* 01/27/2014 1716   BUN 24* 01/27/2014 1716   CREATININE 1.48* 01/27/2014 1716   CALCIUM 8.9 01/27/2014 1716   PROT 7.5 01/27/2014 1716   ALBUMIN 3.7 01/27/2014 1716   AST 25 01/27/2014 1716   ALT 20 01/27/2014 1716   ALKPHOS 99 01/27/2014 1716   BILITOT 0.9 01/27/2014 1716   GFRNONAA 36* 01/27/2014 1716   GFRNONAA 44* 07/15/2013 1435   GFRAA 43* 01/27/2014 1716   GFRAA 51* 07/15/2013 1435    Assessment and Plan :  1. Hypercholesteremia Tolerating low fat diet. Has tried Simvastatin and Pravastatin in the past. Developed dizziness side effect with use of Crestor. Will recheck lipids and CMP. May need to try Zetia. - Lipid panel - COMPLETE METABOLIC PANEL WITH GFR  2. Centriacinar emphysema (HCC) Persistent hypoxia/chronic respiratory failure. Difficulty with decreased  energy, dyspnea, poor appetite, managing ADL's and needs evaluation by home health nurse. Will schedule referral. Continue present oxygen and respiratory treatment. - CBC with Differential/Platelet  3. Hypothyroidism, unspecified hypothyroidism type Stable on the Levothyroxine 75 mcg q am. Will recheck TSH level. - TSH  4. Need for influenza vaccination - Flu vaccine HIGH DOSE PF   Dortha Kern [facility] 11/15/2014 11:18 AM

## 2014-11-16 ENCOUNTER — Telehealth: Payer: Self-pay

## 2014-11-16 LAB — CBC WITH DIFFERENTIAL/PLATELET
BASOS: 1 %
Basophils Absolute: 0.1 10*3/uL (ref 0.0–0.2)
EOS (ABSOLUTE): 0.1 10*3/uL (ref 0.0–0.4)
Eos: 2 %
HEMATOCRIT: 40.9 % (ref 34.0–46.6)
HEMOGLOBIN: 13.6 g/dL (ref 11.1–15.9)
IMMATURE GRANULOCYTES: 0 %
Immature Grans (Abs): 0 10*3/uL (ref 0.0–0.1)
LYMPHS ABS: 2 10*3/uL (ref 0.7–3.1)
Lymphs: 23 %
MCH: 31.5 pg (ref 26.6–33.0)
MCHC: 33.3 g/dL (ref 31.5–35.7)
MCV: 95 fL (ref 79–97)
MONOCYTES: 9 %
MONOS ABS: 0.8 10*3/uL (ref 0.1–0.9)
NEUTROS PCT: 65 %
Neutrophils Absolute: 5.6 10*3/uL (ref 1.4–7.0)
Platelets: 336 10*3/uL (ref 150–379)
RBC: 4.32 x10E6/uL (ref 3.77–5.28)
RDW: 13.5 % (ref 12.3–15.4)
WBC: 8.6 10*3/uL (ref 3.4–10.8)

## 2014-11-16 LAB — TSH: TSH: 4.63 u[IU]/mL — ABNORMAL HIGH (ref 0.450–4.500)

## 2014-11-16 LAB — LIPID PANEL
CHOLESTEROL TOTAL: 308 mg/dL — AB (ref 100–199)
Chol/HDL Ratio: 4.5 ratio units — ABNORMAL HIGH (ref 0.0–4.4)
HDL: 69 mg/dL (ref >39)
LDL CALC: 208 mg/dL — AB (ref 0–99)
TRIGLYCERIDES: 157 mg/dL — AB (ref 0–149)
VLDL Cholesterol Cal: 31 mg/dL (ref 5–40)

## 2014-11-16 LAB — COMPREHENSIVE METABOLIC PANEL
A/G RATIO: 1.8 (ref 1.1–2.5)
ALBUMIN: 4.3 g/dL (ref 3.5–4.7)
ALT: 13 IU/L (ref 0–32)
AST: 34 IU/L (ref 0–40)
Alkaline Phosphatase: 74 IU/L (ref 39–117)
BUN / CREAT RATIO: 15 (ref 11–26)
BUN: 16 mg/dL (ref 8–27)
Bilirubin Total: 0.6 mg/dL (ref 0.0–1.2)
CALCIUM: 9.8 mg/dL (ref 8.7–10.3)
CO2: 29 mmol/L (ref 18–29)
CREATININE: 1.07 mg/dL — AB (ref 0.57–1.00)
Chloride: 98 mmol/L (ref 97–108)
GFR, EST AFRICAN AMERICAN: 54 mL/min/{1.73_m2} — AB (ref >59)
GFR, EST NON AFRICAN AMERICAN: 47 mL/min/{1.73_m2} — AB (ref >59)
Globulin, Total: 2.4 g/dL (ref 1.5–4.5)
Glucose: 107 mg/dL — ABNORMAL HIGH (ref 65–99)
POTASSIUM: 4.6 mmol/L (ref 3.5–5.2)
SODIUM: 142 mmol/L (ref 134–144)
TOTAL PROTEIN: 6.7 g/dL (ref 6.0–8.5)

## 2014-11-16 NOTE — Telephone Encounter (Signed)
-----   Message from Jodell Cipro Trosky, [place] sent at 11/16/2014 12:36 PM EDT ----- Blood counts, liver and kidney tests all in good shape. Thyroid looks good, also (continue present thyroid medication). Cholesterol and Triglycerides high. Limit fats and fried foods in diet. Will get home health nurse to come by for an assessment and work on any available assistance. Schedule recheck appointment in 3 months.

## 2014-11-16 NOTE — Telephone Encounter (Signed)
Patient advised as directed below. Patient verbalized understanding and agrees with treatment plan. Patient states she will call back to schedule a follow up appointment.

## 2014-12-01 DIAGNOSIS — J439 Emphysema, unspecified: Secondary | ICD-10-CM | POA: Diagnosis not present

## 2014-12-01 DIAGNOSIS — J441 Chronic obstructive pulmonary disease with (acute) exacerbation: Secondary | ICD-10-CM | POA: Diagnosis not present

## 2014-12-01 DIAGNOSIS — R0602 Shortness of breath: Secondary | ICD-10-CM | POA: Diagnosis not present

## 2014-12-01 DIAGNOSIS — J9611 Chronic respiratory failure with hypoxia: Secondary | ICD-10-CM | POA: Diagnosis not present

## 2014-12-05 DIAGNOSIS — J449 Chronic obstructive pulmonary disease, unspecified: Secondary | ICD-10-CM | POA: Diagnosis not present

## 2014-12-07 DIAGNOSIS — E039 Hypothyroidism, unspecified: Secondary | ICD-10-CM | POA: Diagnosis not present

## 2014-12-07 DIAGNOSIS — J441 Chronic obstructive pulmonary disease with (acute) exacerbation: Secondary | ICD-10-CM | POA: Diagnosis not present

## 2014-12-07 DIAGNOSIS — J9611 Chronic respiratory failure with hypoxia: Secondary | ICD-10-CM | POA: Diagnosis not present

## 2014-12-07 DIAGNOSIS — J301 Allergic rhinitis due to pollen: Secondary | ICD-10-CM | POA: Diagnosis not present

## 2014-12-07 DIAGNOSIS — J45909 Unspecified asthma, uncomplicated: Secondary | ICD-10-CM | POA: Diagnosis not present

## 2014-12-07 DIAGNOSIS — Z8701 Personal history of pneumonia (recurrent): Secondary | ICD-10-CM | POA: Diagnosis not present

## 2014-12-07 DIAGNOSIS — I10 Essential (primary) hypertension: Secondary | ICD-10-CM | POA: Diagnosis not present

## 2014-12-07 DIAGNOSIS — M1990 Unspecified osteoarthritis, unspecified site: Secondary | ICD-10-CM | POA: Diagnosis not present

## 2014-12-07 DIAGNOSIS — Z8673 Personal history of transient ischemic attack (TIA), and cerebral infarction without residual deficits: Secondary | ICD-10-CM | POA: Diagnosis not present

## 2014-12-08 ENCOUNTER — Telehealth: Payer: Self-pay

## 2014-12-08 NOTE — Telephone Encounter (Signed)
Cynthia [name] (home health nurse) is calling requesting verbal orders for the following:  OT and PT evaluation Skill nurse and home health nurse visits twice a week for the next 9 weeks.  Ok to order? Please advise Cynthia BlonderDenise at [phone]. Thanks!

## 2014-12-09 DIAGNOSIS — J9611 Chronic respiratory failure with hypoxia: Secondary | ICD-10-CM | POA: Diagnosis not present

## 2014-12-09 DIAGNOSIS — J441 Chronic obstructive pulmonary disease with (acute) exacerbation: Secondary | ICD-10-CM | POA: Diagnosis not present

## 2014-12-09 DIAGNOSIS — J301 Allergic rhinitis due to pollen: Secondary | ICD-10-CM | POA: Diagnosis not present

## 2014-12-09 DIAGNOSIS — J45909 Unspecified asthma, uncomplicated: Secondary | ICD-10-CM | POA: Diagnosis not present

## 2014-12-09 DIAGNOSIS — M1990 Unspecified osteoarthritis, unspecified site: Secondary | ICD-10-CM | POA: Diagnosis not present

## 2014-12-09 DIAGNOSIS — Z8673 Personal history of transient ischemic attack (TIA), and cerebral infarction without residual deficits: Secondary | ICD-10-CM | POA: Diagnosis not present

## 2014-12-09 DIAGNOSIS — E039 Hypothyroidism, unspecified: Secondary | ICD-10-CM | POA: Diagnosis not present

## 2014-12-09 DIAGNOSIS — Z8701 Personal history of pneumonia (recurrent): Secondary | ICD-10-CM | POA: Diagnosis not present

## 2014-12-09 DIAGNOSIS — I10 Essential (primary) hypertension: Secondary | ICD-10-CM | POA: Diagnosis not present

## 2014-12-09 NOTE — Telephone Encounter (Signed)
OK to order OT and PT evaluation with skilled nurse and home health nurse visits twice a week for the next 9 weeks.

## 2014-12-10 NOTE — Telephone Encounter (Signed)
Verbal order given to Denise  

## 2014-12-14 DIAGNOSIS — Z8673 Personal history of transient ischemic attack (TIA), and cerebral infarction without residual deficits: Secondary | ICD-10-CM | POA: Diagnosis not present

## 2014-12-14 DIAGNOSIS — J301 Allergic rhinitis due to pollen: Secondary | ICD-10-CM | POA: Diagnosis not present

## 2014-12-14 DIAGNOSIS — M1990 Unspecified osteoarthritis, unspecified site: Secondary | ICD-10-CM | POA: Diagnosis not present

## 2014-12-14 DIAGNOSIS — Z8701 Personal history of pneumonia (recurrent): Secondary | ICD-10-CM | POA: Diagnosis not present

## 2014-12-14 DIAGNOSIS — E039 Hypothyroidism, unspecified: Secondary | ICD-10-CM | POA: Diagnosis not present

## 2014-12-14 DIAGNOSIS — J45909 Unspecified asthma, uncomplicated: Secondary | ICD-10-CM | POA: Diagnosis not present

## 2014-12-14 DIAGNOSIS — J9611 Chronic respiratory failure with hypoxia: Secondary | ICD-10-CM | POA: Diagnosis not present

## 2014-12-14 DIAGNOSIS — I10 Essential (primary) hypertension: Secondary | ICD-10-CM | POA: Diagnosis not present

## 2014-12-14 DIAGNOSIS — J441 Chronic obstructive pulmonary disease with (acute) exacerbation: Secondary | ICD-10-CM | POA: Diagnosis not present

## 2014-12-16 DIAGNOSIS — I10 Essential (primary) hypertension: Secondary | ICD-10-CM | POA: Diagnosis not present

## 2014-12-16 DIAGNOSIS — J441 Chronic obstructive pulmonary disease with (acute) exacerbation: Secondary | ICD-10-CM | POA: Diagnosis not present

## 2014-12-16 DIAGNOSIS — Z8701 Personal history of pneumonia (recurrent): Secondary | ICD-10-CM | POA: Diagnosis not present

## 2014-12-16 DIAGNOSIS — J301 Allergic rhinitis due to pollen: Secondary | ICD-10-CM | POA: Diagnosis not present

## 2014-12-16 DIAGNOSIS — E039 Hypothyroidism, unspecified: Secondary | ICD-10-CM | POA: Diagnosis not present

## 2014-12-16 DIAGNOSIS — J9611 Chronic respiratory failure with hypoxia: Secondary | ICD-10-CM | POA: Diagnosis not present

## 2014-12-16 DIAGNOSIS — M1990 Unspecified osteoarthritis, unspecified site: Secondary | ICD-10-CM | POA: Diagnosis not present

## 2014-12-16 DIAGNOSIS — Z8673 Personal history of transient ischemic attack (TIA), and cerebral infarction without residual deficits: Secondary | ICD-10-CM | POA: Diagnosis not present

## 2014-12-16 DIAGNOSIS — J45909 Unspecified asthma, uncomplicated: Secondary | ICD-10-CM | POA: Diagnosis not present

## 2014-12-17 DIAGNOSIS — Z8701 Personal history of pneumonia (recurrent): Secondary | ICD-10-CM | POA: Diagnosis not present

## 2014-12-17 DIAGNOSIS — Z8673 Personal history of transient ischemic attack (TIA), and cerebral infarction without residual deficits: Secondary | ICD-10-CM | POA: Diagnosis not present

## 2014-12-17 DIAGNOSIS — J45909 Unspecified asthma, uncomplicated: Secondary | ICD-10-CM | POA: Diagnosis not present

## 2014-12-17 DIAGNOSIS — J301 Allergic rhinitis due to pollen: Secondary | ICD-10-CM | POA: Diagnosis not present

## 2014-12-17 DIAGNOSIS — J441 Chronic obstructive pulmonary disease with (acute) exacerbation: Secondary | ICD-10-CM | POA: Diagnosis not present

## 2014-12-17 DIAGNOSIS — E039 Hypothyroidism, unspecified: Secondary | ICD-10-CM | POA: Diagnosis not present

## 2014-12-17 DIAGNOSIS — J9611 Chronic respiratory failure with hypoxia: Secondary | ICD-10-CM | POA: Diagnosis not present

## 2014-12-17 DIAGNOSIS — I10 Essential (primary) hypertension: Secondary | ICD-10-CM | POA: Diagnosis not present

## 2014-12-17 DIAGNOSIS — M1990 Unspecified osteoarthritis, unspecified site: Secondary | ICD-10-CM | POA: Diagnosis not present

## 2014-12-20 DIAGNOSIS — M1990 Unspecified osteoarthritis, unspecified site: Secondary | ICD-10-CM | POA: Diagnosis not present

## 2014-12-20 DIAGNOSIS — J9611 Chronic respiratory failure with hypoxia: Secondary | ICD-10-CM | POA: Diagnosis not present

## 2014-12-20 DIAGNOSIS — J441 Chronic obstructive pulmonary disease with (acute) exacerbation: Secondary | ICD-10-CM | POA: Diagnosis not present

## 2014-12-20 DIAGNOSIS — J45909 Unspecified asthma, uncomplicated: Secondary | ICD-10-CM | POA: Diagnosis not present

## 2014-12-20 DIAGNOSIS — Z8673 Personal history of transient ischemic attack (TIA), and cerebral infarction without residual deficits: Secondary | ICD-10-CM | POA: Diagnosis not present

## 2014-12-20 DIAGNOSIS — J301 Allergic rhinitis due to pollen: Secondary | ICD-10-CM | POA: Diagnosis not present

## 2014-12-20 DIAGNOSIS — I10 Essential (primary) hypertension: Secondary | ICD-10-CM | POA: Diagnosis not present

## 2014-12-20 DIAGNOSIS — Z8701 Personal history of pneumonia (recurrent): Secondary | ICD-10-CM | POA: Diagnosis not present

## 2014-12-20 DIAGNOSIS — E039 Hypothyroidism, unspecified: Secondary | ICD-10-CM | POA: Diagnosis not present

## 2014-12-23 DIAGNOSIS — J45909 Unspecified asthma, uncomplicated: Secondary | ICD-10-CM | POA: Diagnosis not present

## 2014-12-23 DIAGNOSIS — Z8673 Personal history of transient ischemic attack (TIA), and cerebral infarction without residual deficits: Secondary | ICD-10-CM | POA: Diagnosis not present

## 2014-12-23 DIAGNOSIS — I10 Essential (primary) hypertension: Secondary | ICD-10-CM | POA: Diagnosis not present

## 2014-12-23 DIAGNOSIS — Z8701 Personal history of pneumonia (recurrent): Secondary | ICD-10-CM | POA: Diagnosis not present

## 2014-12-23 DIAGNOSIS — J9611 Chronic respiratory failure with hypoxia: Secondary | ICD-10-CM | POA: Diagnosis not present

## 2014-12-23 DIAGNOSIS — J301 Allergic rhinitis due to pollen: Secondary | ICD-10-CM | POA: Diagnosis not present

## 2014-12-23 DIAGNOSIS — J441 Chronic obstructive pulmonary disease with (acute) exacerbation: Secondary | ICD-10-CM | POA: Diagnosis not present

## 2014-12-23 DIAGNOSIS — E039 Hypothyroidism, unspecified: Secondary | ICD-10-CM | POA: Diagnosis not present

## 2014-12-23 DIAGNOSIS — M1990 Unspecified osteoarthritis, unspecified site: Secondary | ICD-10-CM | POA: Diagnosis not present

## 2014-12-24 DIAGNOSIS — J301 Allergic rhinitis due to pollen: Secondary | ICD-10-CM | POA: Diagnosis not present

## 2014-12-24 DIAGNOSIS — Z8701 Personal history of pneumonia (recurrent): Secondary | ICD-10-CM | POA: Diagnosis not present

## 2014-12-24 DIAGNOSIS — I10 Essential (primary) hypertension: Secondary | ICD-10-CM | POA: Diagnosis not present

## 2014-12-24 DIAGNOSIS — Z8673 Personal history of transient ischemic attack (TIA), and cerebral infarction without residual deficits: Secondary | ICD-10-CM | POA: Diagnosis not present

## 2014-12-24 DIAGNOSIS — J441 Chronic obstructive pulmonary disease with (acute) exacerbation: Secondary | ICD-10-CM | POA: Diagnosis not present

## 2014-12-24 DIAGNOSIS — J9611 Chronic respiratory failure with hypoxia: Secondary | ICD-10-CM | POA: Diagnosis not present

## 2014-12-24 DIAGNOSIS — J45909 Unspecified asthma, uncomplicated: Secondary | ICD-10-CM | POA: Diagnosis not present

## 2014-12-24 DIAGNOSIS — E039 Hypothyroidism, unspecified: Secondary | ICD-10-CM | POA: Diagnosis not present

## 2014-12-24 DIAGNOSIS — M1990 Unspecified osteoarthritis, unspecified site: Secondary | ICD-10-CM | POA: Diagnosis not present

## 2014-12-27 DIAGNOSIS — Z8701 Personal history of pneumonia (recurrent): Secondary | ICD-10-CM | POA: Diagnosis not present

## 2014-12-27 DIAGNOSIS — J9611 Chronic respiratory failure with hypoxia: Secondary | ICD-10-CM | POA: Diagnosis not present

## 2014-12-27 DIAGNOSIS — Z8673 Personal history of transient ischemic attack (TIA), and cerebral infarction without residual deficits: Secondary | ICD-10-CM | POA: Diagnosis not present

## 2014-12-27 DIAGNOSIS — J301 Allergic rhinitis due to pollen: Secondary | ICD-10-CM | POA: Diagnosis not present

## 2014-12-27 DIAGNOSIS — E039 Hypothyroidism, unspecified: Secondary | ICD-10-CM | POA: Diagnosis not present

## 2014-12-27 DIAGNOSIS — J441 Chronic obstructive pulmonary disease with (acute) exacerbation: Secondary | ICD-10-CM | POA: Diagnosis not present

## 2014-12-27 DIAGNOSIS — M1990 Unspecified osteoarthritis, unspecified site: Secondary | ICD-10-CM | POA: Diagnosis not present

## 2014-12-27 DIAGNOSIS — J45909 Unspecified asthma, uncomplicated: Secondary | ICD-10-CM | POA: Diagnosis not present

## 2014-12-27 DIAGNOSIS — I10 Essential (primary) hypertension: Secondary | ICD-10-CM | POA: Diagnosis not present

## 2014-12-28 DIAGNOSIS — J301 Allergic rhinitis due to pollen: Secondary | ICD-10-CM | POA: Diagnosis not present

## 2014-12-28 DIAGNOSIS — J45909 Unspecified asthma, uncomplicated: Secondary | ICD-10-CM | POA: Diagnosis not present

## 2014-12-28 DIAGNOSIS — Z8701 Personal history of pneumonia (recurrent): Secondary | ICD-10-CM | POA: Diagnosis not present

## 2014-12-28 DIAGNOSIS — I10 Essential (primary) hypertension: Secondary | ICD-10-CM | POA: Diagnosis not present

## 2014-12-28 DIAGNOSIS — J9611 Chronic respiratory failure with hypoxia: Secondary | ICD-10-CM | POA: Diagnosis not present

## 2014-12-28 DIAGNOSIS — J441 Chronic obstructive pulmonary disease with (acute) exacerbation: Secondary | ICD-10-CM | POA: Diagnosis not present

## 2014-12-28 DIAGNOSIS — Z8673 Personal history of transient ischemic attack (TIA), and cerebral infarction without residual deficits: Secondary | ICD-10-CM | POA: Diagnosis not present

## 2014-12-28 DIAGNOSIS — M1990 Unspecified osteoarthritis, unspecified site: Secondary | ICD-10-CM | POA: Diagnosis not present

## 2014-12-28 DIAGNOSIS — E039 Hypothyroidism, unspecified: Secondary | ICD-10-CM | POA: Diagnosis not present

## 2014-12-29 ENCOUNTER — Ambulatory Visit
Admission: RE | Admit: 2014-12-29 | Discharge: 2014-12-29 | Disposition: A | Discharge status: Home / Self Care | Payer: Commercial Managed Care - HMO | Source: Ambulatory Visit | Attending: Internal Medicine | Admitting: Internal Medicine

## 2014-12-29 ENCOUNTER — Other Ambulatory Visit: Payer: Self-pay | Admitting: Internal Medicine

## 2014-12-29 DIAGNOSIS — Z8701 Personal history of pneumonia (recurrent): Secondary | ICD-10-CM | POA: Diagnosis not present

## 2014-12-29 DIAGNOSIS — J441 Chronic obstructive pulmonary disease with (acute) exacerbation: Secondary | ICD-10-CM | POA: Diagnosis not present

## 2014-12-29 DIAGNOSIS — I10 Essential (primary) hypertension: Secondary | ICD-10-CM | POA: Diagnosis not present

## 2014-12-29 DIAGNOSIS — R05 Cough: Secondary | ICD-10-CM

## 2014-12-29 DIAGNOSIS — R0602 Shortness of breath: Secondary | ICD-10-CM

## 2014-12-29 DIAGNOSIS — R059 Cough, unspecified: Secondary | ICD-10-CM

## 2014-12-29 DIAGNOSIS — J449 Chronic obstructive pulmonary disease, unspecified: Secondary | ICD-10-CM | POA: Diagnosis not present

## 2014-12-29 DIAGNOSIS — Z8673 Personal history of transient ischemic attack (TIA), and cerebral infarction without residual deficits: Secondary | ICD-10-CM | POA: Diagnosis not present

## 2014-12-29 DIAGNOSIS — E039 Hypothyroidism, unspecified: Secondary | ICD-10-CM | POA: Diagnosis not present

## 2014-12-29 DIAGNOSIS — J301 Allergic rhinitis due to pollen: Secondary | ICD-10-CM | POA: Diagnosis not present

## 2014-12-29 DIAGNOSIS — M1990 Unspecified osteoarthritis, unspecified site: Secondary | ICD-10-CM | POA: Diagnosis not present

## 2014-12-29 DIAGNOSIS — J9611 Chronic respiratory failure with hypoxia: Secondary | ICD-10-CM | POA: Diagnosis not present

## 2014-12-29 DIAGNOSIS — J45909 Unspecified asthma, uncomplicated: Secondary | ICD-10-CM | POA: Diagnosis not present

## 2014-12-30 DIAGNOSIS — Z8701 Personal history of pneumonia (recurrent): Secondary | ICD-10-CM | POA: Diagnosis not present

## 2014-12-30 DIAGNOSIS — J301 Allergic rhinitis due to pollen: Secondary | ICD-10-CM | POA: Diagnosis not present

## 2014-12-30 DIAGNOSIS — J45909 Unspecified asthma, uncomplicated: Secondary | ICD-10-CM | POA: Diagnosis not present

## 2014-12-30 DIAGNOSIS — J9611 Chronic respiratory failure with hypoxia: Secondary | ICD-10-CM | POA: Diagnosis not present

## 2014-12-30 DIAGNOSIS — J441 Chronic obstructive pulmonary disease with (acute) exacerbation: Secondary | ICD-10-CM | POA: Diagnosis not present

## 2014-12-30 DIAGNOSIS — M1990 Unspecified osteoarthritis, unspecified site: Secondary | ICD-10-CM | POA: Diagnosis not present

## 2014-12-30 DIAGNOSIS — E039 Hypothyroidism, unspecified: Secondary | ICD-10-CM | POA: Diagnosis not present

## 2014-12-30 DIAGNOSIS — I10 Essential (primary) hypertension: Secondary | ICD-10-CM | POA: Diagnosis not present

## 2014-12-30 DIAGNOSIS — Z8673 Personal history of transient ischemic attack (TIA), and cerebral infarction without residual deficits: Secondary | ICD-10-CM | POA: Diagnosis not present

## 2014-12-31 DIAGNOSIS — Z8673 Personal history of transient ischemic attack (TIA), and cerebral infarction without residual deficits: Secondary | ICD-10-CM | POA: Diagnosis not present

## 2014-12-31 DIAGNOSIS — J441 Chronic obstructive pulmonary disease with (acute) exacerbation: Secondary | ICD-10-CM | POA: Diagnosis not present

## 2014-12-31 DIAGNOSIS — Z8701 Personal history of pneumonia (recurrent): Secondary | ICD-10-CM | POA: Diagnosis not present

## 2014-12-31 DIAGNOSIS — J9611 Chronic respiratory failure with hypoxia: Secondary | ICD-10-CM | POA: Diagnosis not present

## 2014-12-31 DIAGNOSIS — M1990 Unspecified osteoarthritis, unspecified site: Secondary | ICD-10-CM | POA: Diagnosis not present

## 2014-12-31 DIAGNOSIS — I10 Essential (primary) hypertension: Secondary | ICD-10-CM | POA: Diagnosis not present

## 2014-12-31 DIAGNOSIS — J301 Allergic rhinitis due to pollen: Secondary | ICD-10-CM | POA: Diagnosis not present

## 2014-12-31 DIAGNOSIS — J45909 Unspecified asthma, uncomplicated: Secondary | ICD-10-CM | POA: Diagnosis not present

## 2014-12-31 DIAGNOSIS — E039 Hypothyroidism, unspecified: Secondary | ICD-10-CM | POA: Diagnosis not present

## 2015-01-03 DIAGNOSIS — J301 Allergic rhinitis due to pollen: Secondary | ICD-10-CM | POA: Diagnosis not present

## 2015-01-03 DIAGNOSIS — Z8673 Personal history of transient ischemic attack (TIA), and cerebral infarction without residual deficits: Secondary | ICD-10-CM | POA: Diagnosis not present

## 2015-01-03 DIAGNOSIS — J441 Chronic obstructive pulmonary disease with (acute) exacerbation: Secondary | ICD-10-CM | POA: Diagnosis not present

## 2015-01-03 DIAGNOSIS — J45909 Unspecified asthma, uncomplicated: Secondary | ICD-10-CM | POA: Diagnosis not present

## 2015-01-03 DIAGNOSIS — I10 Essential (primary) hypertension: Secondary | ICD-10-CM | POA: Diagnosis not present

## 2015-01-03 DIAGNOSIS — J9611 Chronic respiratory failure with hypoxia: Secondary | ICD-10-CM | POA: Diagnosis not present

## 2015-01-03 DIAGNOSIS — M1990 Unspecified osteoarthritis, unspecified site: Secondary | ICD-10-CM | POA: Diagnosis not present

## 2015-01-03 DIAGNOSIS — Z8701 Personal history of pneumonia (recurrent): Secondary | ICD-10-CM | POA: Diagnosis not present

## 2015-01-03 DIAGNOSIS — E039 Hypothyroidism, unspecified: Secondary | ICD-10-CM | POA: Diagnosis not present

## 2015-01-04 DIAGNOSIS — Z8673 Personal history of transient ischemic attack (TIA), and cerebral infarction without residual deficits: Secondary | ICD-10-CM | POA: Diagnosis not present

## 2015-01-04 DIAGNOSIS — J45909 Unspecified asthma, uncomplicated: Secondary | ICD-10-CM | POA: Diagnosis not present

## 2015-01-04 DIAGNOSIS — M1990 Unspecified osteoarthritis, unspecified site: Secondary | ICD-10-CM | POA: Diagnosis not present

## 2015-01-04 DIAGNOSIS — J301 Allergic rhinitis due to pollen: Secondary | ICD-10-CM | POA: Diagnosis not present

## 2015-01-04 DIAGNOSIS — Z8701 Personal history of pneumonia (recurrent): Secondary | ICD-10-CM | POA: Diagnosis not present

## 2015-01-04 DIAGNOSIS — J441 Chronic obstructive pulmonary disease with (acute) exacerbation: Secondary | ICD-10-CM | POA: Diagnosis not present

## 2015-01-04 DIAGNOSIS — J9611 Chronic respiratory failure with hypoxia: Secondary | ICD-10-CM | POA: Diagnosis not present

## 2015-01-04 DIAGNOSIS — E039 Hypothyroidism, unspecified: Secondary | ICD-10-CM | POA: Diagnosis not present

## 2015-01-04 DIAGNOSIS — I10 Essential (primary) hypertension: Secondary | ICD-10-CM | POA: Diagnosis not present

## 2015-01-05 DIAGNOSIS — J449 Chronic obstructive pulmonary disease, unspecified: Secondary | ICD-10-CM | POA: Diagnosis not present

## 2015-01-10 DIAGNOSIS — E039 Hypothyroidism, unspecified: Secondary | ICD-10-CM | POA: Diagnosis not present

## 2015-01-10 DIAGNOSIS — J301 Allergic rhinitis due to pollen: Secondary | ICD-10-CM | POA: Diagnosis not present

## 2015-01-10 DIAGNOSIS — J441 Chronic obstructive pulmonary disease with (acute) exacerbation: Secondary | ICD-10-CM | POA: Diagnosis not present

## 2015-01-10 DIAGNOSIS — Z8701 Personal history of pneumonia (recurrent): Secondary | ICD-10-CM | POA: Diagnosis not present

## 2015-01-10 DIAGNOSIS — Z8673 Personal history of transient ischemic attack (TIA), and cerebral infarction without residual deficits: Secondary | ICD-10-CM | POA: Diagnosis not present

## 2015-01-10 DIAGNOSIS — M1990 Unspecified osteoarthritis, unspecified site: Secondary | ICD-10-CM | POA: Diagnosis not present

## 2015-01-10 DIAGNOSIS — J9611 Chronic respiratory failure with hypoxia: Secondary | ICD-10-CM | POA: Diagnosis not present

## 2015-01-10 DIAGNOSIS — I10 Essential (primary) hypertension: Secondary | ICD-10-CM | POA: Diagnosis not present

## 2015-01-10 DIAGNOSIS — J45909 Unspecified asthma, uncomplicated: Secondary | ICD-10-CM | POA: Diagnosis not present

## 2015-01-11 DIAGNOSIS — Z8701 Personal history of pneumonia (recurrent): Secondary | ICD-10-CM | POA: Diagnosis not present

## 2015-01-11 DIAGNOSIS — J441 Chronic obstructive pulmonary disease with (acute) exacerbation: Secondary | ICD-10-CM | POA: Diagnosis not present

## 2015-01-11 DIAGNOSIS — Z8673 Personal history of transient ischemic attack (TIA), and cerebral infarction without residual deficits: Secondary | ICD-10-CM | POA: Diagnosis not present

## 2015-01-11 DIAGNOSIS — J9611 Chronic respiratory failure with hypoxia: Secondary | ICD-10-CM | POA: Diagnosis not present

## 2015-01-11 DIAGNOSIS — I10 Essential (primary) hypertension: Secondary | ICD-10-CM | POA: Diagnosis not present

## 2015-01-11 DIAGNOSIS — E039 Hypothyroidism, unspecified: Secondary | ICD-10-CM | POA: Diagnosis not present

## 2015-01-11 DIAGNOSIS — M1990 Unspecified osteoarthritis, unspecified site: Secondary | ICD-10-CM | POA: Diagnosis not present

## 2015-01-11 DIAGNOSIS — J301 Allergic rhinitis due to pollen: Secondary | ICD-10-CM | POA: Diagnosis not present

## 2015-01-11 DIAGNOSIS — J45909 Unspecified asthma, uncomplicated: Secondary | ICD-10-CM | POA: Diagnosis not present

## 2015-01-12 DIAGNOSIS — Z8673 Personal history of transient ischemic attack (TIA), and cerebral infarction without residual deficits: Secondary | ICD-10-CM | POA: Diagnosis not present

## 2015-01-12 DIAGNOSIS — J9611 Chronic respiratory failure with hypoxia: Secondary | ICD-10-CM | POA: Diagnosis not present

## 2015-01-12 DIAGNOSIS — J301 Allergic rhinitis due to pollen: Secondary | ICD-10-CM | POA: Diagnosis not present

## 2015-01-12 DIAGNOSIS — I10 Essential (primary) hypertension: Secondary | ICD-10-CM | POA: Diagnosis not present

## 2015-01-12 DIAGNOSIS — J45909 Unspecified asthma, uncomplicated: Secondary | ICD-10-CM | POA: Diagnosis not present

## 2015-01-12 DIAGNOSIS — E039 Hypothyroidism, unspecified: Secondary | ICD-10-CM | POA: Diagnosis not present

## 2015-01-12 DIAGNOSIS — J441 Chronic obstructive pulmonary disease with (acute) exacerbation: Secondary | ICD-10-CM | POA: Diagnosis not present

## 2015-01-12 DIAGNOSIS — Z8701 Personal history of pneumonia (recurrent): Secondary | ICD-10-CM | POA: Diagnosis not present

## 2015-01-12 DIAGNOSIS — M1990 Unspecified osteoarthritis, unspecified site: Secondary | ICD-10-CM | POA: Diagnosis not present

## 2015-01-13 DIAGNOSIS — I10 Essential (primary) hypertension: Secondary | ICD-10-CM | POA: Diagnosis not present

## 2015-01-13 DIAGNOSIS — J9611 Chronic respiratory failure with hypoxia: Secondary | ICD-10-CM | POA: Diagnosis not present

## 2015-01-13 DIAGNOSIS — J441 Chronic obstructive pulmonary disease with (acute) exacerbation: Secondary | ICD-10-CM | POA: Diagnosis not present

## 2015-01-13 DIAGNOSIS — J301 Allergic rhinitis due to pollen: Secondary | ICD-10-CM | POA: Diagnosis not present

## 2015-01-13 DIAGNOSIS — J449 Chronic obstructive pulmonary disease, unspecified: Secondary | ICD-10-CM | POA: Diagnosis not present

## 2015-01-13 DIAGNOSIS — J45909 Unspecified asthma, uncomplicated: Secondary | ICD-10-CM | POA: Diagnosis not present

## 2015-01-13 DIAGNOSIS — E039 Hypothyroidism, unspecified: Secondary | ICD-10-CM | POA: Diagnosis not present

## 2015-01-13 DIAGNOSIS — Z8701 Personal history of pneumonia (recurrent): Secondary | ICD-10-CM | POA: Diagnosis not present

## 2015-01-13 DIAGNOSIS — Z8673 Personal history of transient ischemic attack (TIA), and cerebral infarction without residual deficits: Secondary | ICD-10-CM | POA: Diagnosis not present

## 2015-01-13 DIAGNOSIS — M1990 Unspecified osteoarthritis, unspecified site: Secondary | ICD-10-CM | POA: Diagnosis not present

## 2015-01-14 DIAGNOSIS — J9611 Chronic respiratory failure with hypoxia: Secondary | ICD-10-CM | POA: Diagnosis not present

## 2015-01-14 DIAGNOSIS — E039 Hypothyroidism, unspecified: Secondary | ICD-10-CM | POA: Diagnosis not present

## 2015-01-14 DIAGNOSIS — M1990 Unspecified osteoarthritis, unspecified site: Secondary | ICD-10-CM | POA: Diagnosis not present

## 2015-01-14 DIAGNOSIS — Z8673 Personal history of transient ischemic attack (TIA), and cerebral infarction without residual deficits: Secondary | ICD-10-CM | POA: Diagnosis not present

## 2015-01-14 DIAGNOSIS — J441 Chronic obstructive pulmonary disease with (acute) exacerbation: Secondary | ICD-10-CM | POA: Diagnosis not present

## 2015-01-14 DIAGNOSIS — Z8701 Personal history of pneumonia (recurrent): Secondary | ICD-10-CM | POA: Diagnosis not present

## 2015-01-14 DIAGNOSIS — J45909 Unspecified asthma, uncomplicated: Secondary | ICD-10-CM | POA: Diagnosis not present

## 2015-01-14 DIAGNOSIS — I10 Essential (primary) hypertension: Secondary | ICD-10-CM | POA: Diagnosis not present

## 2015-01-14 DIAGNOSIS — J301 Allergic rhinitis due to pollen: Secondary | ICD-10-CM | POA: Diagnosis not present

## 2015-01-17 DIAGNOSIS — J301 Allergic rhinitis due to pollen: Secondary | ICD-10-CM | POA: Diagnosis not present

## 2015-01-17 DIAGNOSIS — J441 Chronic obstructive pulmonary disease with (acute) exacerbation: Secondary | ICD-10-CM | POA: Diagnosis not present

## 2015-01-17 DIAGNOSIS — E039 Hypothyroidism, unspecified: Secondary | ICD-10-CM | POA: Diagnosis not present

## 2015-01-17 DIAGNOSIS — I10 Essential (primary) hypertension: Secondary | ICD-10-CM | POA: Diagnosis not present

## 2015-01-17 DIAGNOSIS — M1990 Unspecified osteoarthritis, unspecified site: Secondary | ICD-10-CM | POA: Diagnosis not present

## 2015-01-17 DIAGNOSIS — J9611 Chronic respiratory failure with hypoxia: Secondary | ICD-10-CM | POA: Diagnosis not present

## 2015-01-17 DIAGNOSIS — J45909 Unspecified asthma, uncomplicated: Secondary | ICD-10-CM | POA: Diagnosis not present

## 2015-01-17 DIAGNOSIS — Z8673 Personal history of transient ischemic attack (TIA), and cerebral infarction without residual deficits: Secondary | ICD-10-CM | POA: Diagnosis not present

## 2015-01-17 DIAGNOSIS — Z8701 Personal history of pneumonia (recurrent): Secondary | ICD-10-CM | POA: Diagnosis not present

## 2015-01-19 DIAGNOSIS — Z8701 Personal history of pneumonia (recurrent): Secondary | ICD-10-CM | POA: Diagnosis not present

## 2015-01-19 DIAGNOSIS — J45909 Unspecified asthma, uncomplicated: Secondary | ICD-10-CM | POA: Diagnosis not present

## 2015-01-19 DIAGNOSIS — J9611 Chronic respiratory failure with hypoxia: Secondary | ICD-10-CM | POA: Diagnosis not present

## 2015-01-19 DIAGNOSIS — Z8673 Personal history of transient ischemic attack (TIA), and cerebral infarction without residual deficits: Secondary | ICD-10-CM | POA: Diagnosis not present

## 2015-01-19 DIAGNOSIS — E039 Hypothyroidism, unspecified: Secondary | ICD-10-CM | POA: Diagnosis not present

## 2015-01-19 DIAGNOSIS — J441 Chronic obstructive pulmonary disease with (acute) exacerbation: Secondary | ICD-10-CM | POA: Diagnosis not present

## 2015-01-19 DIAGNOSIS — I10 Essential (primary) hypertension: Secondary | ICD-10-CM | POA: Diagnosis not present

## 2015-01-19 DIAGNOSIS — J301 Allergic rhinitis due to pollen: Secondary | ICD-10-CM | POA: Diagnosis not present

## 2015-01-19 DIAGNOSIS — M1990 Unspecified osteoarthritis, unspecified site: Secondary | ICD-10-CM | POA: Diagnosis not present

## 2015-01-20 ENCOUNTER — Other Ambulatory Visit: Payer: Self-pay

## 2015-01-20 NOTE — Patient Outreach (Signed)
[facility] Care Management  01/20/2015  [name] 19-Jun-1925 [id number]   Telephone Screen  Referral Date: 01/18/15 Referral Source: [facility] tier 4 [facility] Referral Reason:COPD with no ED visits and 1 admit  Outreach attempt # 1 to patient. Patient reached. Discussed and reviewed services. Patient states that she has three wonderful nieces to help manage her medical care. Patient also reports that she had an aide that comes to assist her with personal care and some household duties. She reports she is on the waiting list for meals on wheels and in the meantime her nieces are preparing meals for her.She is having no issues affording meds as she uses [employer] mail order. She states that her family takes her to all medical appointments. She reports that she has looked into Medicaid assistance but her income is too high. Patient denies having CM needs/concerns at this time. She was appreciative of call but declined services at this time. Patient agreeable to receiving [facility] brochure and contact info for future reference.   Plan: RN CM will notify [facility] administrative assistant patient declined services and case closed. RN CM will send case closure letter to MD. RN CM will [facility] and contact info to patient for future reference.  Antionette Fairyoshanda Hashim Eichhorst, RN,BSN,CCM [facility] Care [facility] Telephonic Care Management Coordinator Direct Phone: [phone] Toll Free: [phone] Fax: [phone]

## 2015-01-21 DIAGNOSIS — J45909 Unspecified asthma, uncomplicated: Secondary | ICD-10-CM | POA: Diagnosis not present

## 2015-01-21 DIAGNOSIS — E039 Hypothyroidism, unspecified: Secondary | ICD-10-CM | POA: Diagnosis not present

## 2015-01-21 DIAGNOSIS — M1990 Unspecified osteoarthritis, unspecified site: Secondary | ICD-10-CM | POA: Diagnosis not present

## 2015-01-21 DIAGNOSIS — Z8673 Personal history of transient ischemic attack (TIA), and cerebral infarction without residual deficits: Secondary | ICD-10-CM | POA: Diagnosis not present

## 2015-01-21 DIAGNOSIS — I10 Essential (primary) hypertension: Secondary | ICD-10-CM | POA: Diagnosis not present

## 2015-01-21 DIAGNOSIS — J9611 Chronic respiratory failure with hypoxia: Secondary | ICD-10-CM | POA: Diagnosis not present

## 2015-01-21 DIAGNOSIS — Z8701 Personal history of pneumonia (recurrent): Secondary | ICD-10-CM | POA: Diagnosis not present

## 2015-01-21 DIAGNOSIS — J301 Allergic rhinitis due to pollen: Secondary | ICD-10-CM | POA: Diagnosis not present

## 2015-01-21 DIAGNOSIS — J441 Chronic obstructive pulmonary disease with (acute) exacerbation: Secondary | ICD-10-CM | POA: Diagnosis not present

## 2015-01-24 DIAGNOSIS — J45909 Unspecified asthma, uncomplicated: Secondary | ICD-10-CM | POA: Diagnosis not present

## 2015-01-24 DIAGNOSIS — J9611 Chronic respiratory failure with hypoxia: Secondary | ICD-10-CM | POA: Diagnosis not present

## 2015-01-24 DIAGNOSIS — E039 Hypothyroidism, unspecified: Secondary | ICD-10-CM | POA: Diagnosis not present

## 2015-01-24 DIAGNOSIS — M1990 Unspecified osteoarthritis, unspecified site: Secondary | ICD-10-CM | POA: Diagnosis not present

## 2015-01-24 DIAGNOSIS — Z8701 Personal history of pneumonia (recurrent): Secondary | ICD-10-CM | POA: Diagnosis not present

## 2015-01-24 DIAGNOSIS — J441 Chronic obstructive pulmonary disease with (acute) exacerbation: Secondary | ICD-10-CM | POA: Diagnosis not present

## 2015-01-24 DIAGNOSIS — J301 Allergic rhinitis due to pollen: Secondary | ICD-10-CM | POA: Diagnosis not present

## 2015-01-24 DIAGNOSIS — I10 Essential (primary) hypertension: Secondary | ICD-10-CM | POA: Diagnosis not present

## 2015-01-24 DIAGNOSIS — Z8673 Personal history of transient ischemic attack (TIA), and cerebral infarction without residual deficits: Secondary | ICD-10-CM | POA: Diagnosis not present

## 2015-01-26 DIAGNOSIS — J45909 Unspecified asthma, uncomplicated: Secondary | ICD-10-CM | POA: Diagnosis not present

## 2015-01-26 DIAGNOSIS — Z8701 Personal history of pneumonia (recurrent): Secondary | ICD-10-CM | POA: Diagnosis not present

## 2015-01-26 DIAGNOSIS — J441 Chronic obstructive pulmonary disease with (acute) exacerbation: Secondary | ICD-10-CM | POA: Diagnosis not present

## 2015-01-26 DIAGNOSIS — Z8673 Personal history of transient ischemic attack (TIA), and cerebral infarction without residual deficits: Secondary | ICD-10-CM | POA: Diagnosis not present

## 2015-01-26 DIAGNOSIS — E039 Hypothyroidism, unspecified: Secondary | ICD-10-CM | POA: Diagnosis not present

## 2015-01-26 DIAGNOSIS — J301 Allergic rhinitis due to pollen: Secondary | ICD-10-CM | POA: Diagnosis not present

## 2015-01-26 DIAGNOSIS — M1990 Unspecified osteoarthritis, unspecified site: Secondary | ICD-10-CM | POA: Diagnosis not present

## 2015-01-26 DIAGNOSIS — J9611 Chronic respiratory failure with hypoxia: Secondary | ICD-10-CM | POA: Diagnosis not present

## 2015-01-26 DIAGNOSIS — I10 Essential (primary) hypertension: Secondary | ICD-10-CM | POA: Diagnosis not present

## 2015-01-28 DIAGNOSIS — J45909 Unspecified asthma, uncomplicated: Secondary | ICD-10-CM | POA: Diagnosis not present

## 2015-01-28 DIAGNOSIS — I10 Essential (primary) hypertension: Secondary | ICD-10-CM | POA: Diagnosis not present

## 2015-01-28 DIAGNOSIS — E039 Hypothyroidism, unspecified: Secondary | ICD-10-CM | POA: Diagnosis not present

## 2015-01-28 DIAGNOSIS — J301 Allergic rhinitis due to pollen: Secondary | ICD-10-CM | POA: Diagnosis not present

## 2015-01-28 DIAGNOSIS — Z8673 Personal history of transient ischemic attack (TIA), and cerebral infarction without residual deficits: Secondary | ICD-10-CM | POA: Diagnosis not present

## 2015-01-28 DIAGNOSIS — M1990 Unspecified osteoarthritis, unspecified site: Secondary | ICD-10-CM | POA: Diagnosis not present

## 2015-01-28 DIAGNOSIS — Z8701 Personal history of pneumonia (recurrent): Secondary | ICD-10-CM | POA: Diagnosis not present

## 2015-01-28 DIAGNOSIS — J441 Chronic obstructive pulmonary disease with (acute) exacerbation: Secondary | ICD-10-CM | POA: Diagnosis not present

## 2015-01-28 DIAGNOSIS — J9611 Chronic respiratory failure with hypoxia: Secondary | ICD-10-CM | POA: Diagnosis not present

## 2015-01-31 DIAGNOSIS — Z8701 Personal history of pneumonia (recurrent): Secondary | ICD-10-CM | POA: Diagnosis not present

## 2015-01-31 DIAGNOSIS — J301 Allergic rhinitis due to pollen: Secondary | ICD-10-CM | POA: Diagnosis not present

## 2015-01-31 DIAGNOSIS — J45909 Unspecified asthma, uncomplicated: Secondary | ICD-10-CM | POA: Diagnosis not present

## 2015-01-31 DIAGNOSIS — M1990 Unspecified osteoarthritis, unspecified site: Secondary | ICD-10-CM | POA: Diagnosis not present

## 2015-01-31 DIAGNOSIS — E039 Hypothyroidism, unspecified: Secondary | ICD-10-CM | POA: Diagnosis not present

## 2015-01-31 DIAGNOSIS — Z8673 Personal history of transient ischemic attack (TIA), and cerebral infarction without residual deficits: Secondary | ICD-10-CM | POA: Diagnosis not present

## 2015-01-31 DIAGNOSIS — J441 Chronic obstructive pulmonary disease with (acute) exacerbation: Secondary | ICD-10-CM | POA: Diagnosis not present

## 2015-01-31 DIAGNOSIS — I10 Essential (primary) hypertension: Secondary | ICD-10-CM | POA: Diagnosis not present

## 2015-01-31 DIAGNOSIS — J9611 Chronic respiratory failure with hypoxia: Secondary | ICD-10-CM | POA: Diagnosis not present

## 2015-02-01 DIAGNOSIS — I10 Essential (primary) hypertension: Secondary | ICD-10-CM | POA: Diagnosis not present

## 2015-02-01 DIAGNOSIS — J9611 Chronic respiratory failure with hypoxia: Secondary | ICD-10-CM | POA: Diagnosis not present

## 2015-02-01 DIAGNOSIS — J45909 Unspecified asthma, uncomplicated: Secondary | ICD-10-CM | POA: Diagnosis not present

## 2015-02-01 DIAGNOSIS — J441 Chronic obstructive pulmonary disease with (acute) exacerbation: Secondary | ICD-10-CM | POA: Diagnosis not present

## 2015-02-02 DIAGNOSIS — M1990 Unspecified osteoarthritis, unspecified site: Secondary | ICD-10-CM | POA: Diagnosis not present

## 2015-02-02 DIAGNOSIS — I10 Essential (primary) hypertension: Secondary | ICD-10-CM | POA: Diagnosis not present

## 2015-02-02 DIAGNOSIS — Z8673 Personal history of transient ischemic attack (TIA), and cerebral infarction without residual deficits: Secondary | ICD-10-CM | POA: Diagnosis not present

## 2015-02-02 DIAGNOSIS — Z8701 Personal history of pneumonia (recurrent): Secondary | ICD-10-CM | POA: Diagnosis not present

## 2015-02-02 DIAGNOSIS — J45909 Unspecified asthma, uncomplicated: Secondary | ICD-10-CM | POA: Diagnosis not present

## 2015-02-02 DIAGNOSIS — E039 Hypothyroidism, unspecified: Secondary | ICD-10-CM | POA: Diagnosis not present

## 2015-02-02 DIAGNOSIS — J9611 Chronic respiratory failure with hypoxia: Secondary | ICD-10-CM | POA: Diagnosis not present

## 2015-02-02 DIAGNOSIS — J301 Allergic rhinitis due to pollen: Secondary | ICD-10-CM | POA: Diagnosis not present

## 2015-02-02 DIAGNOSIS — J441 Chronic obstructive pulmonary disease with (acute) exacerbation: Secondary | ICD-10-CM | POA: Diagnosis not present

## 2015-02-04 DIAGNOSIS — I10 Essential (primary) hypertension: Secondary | ICD-10-CM | POA: Diagnosis not present

## 2015-02-04 DIAGNOSIS — Z8673 Personal history of transient ischemic attack (TIA), and cerebral infarction without residual deficits: Secondary | ICD-10-CM | POA: Diagnosis not present

## 2015-02-04 DIAGNOSIS — J441 Chronic obstructive pulmonary disease with (acute) exacerbation: Secondary | ICD-10-CM | POA: Diagnosis not present

## 2015-02-04 DIAGNOSIS — J9611 Chronic respiratory failure with hypoxia: Secondary | ICD-10-CM | POA: Diagnosis not present

## 2015-02-04 DIAGNOSIS — J301 Allergic rhinitis due to pollen: Secondary | ICD-10-CM | POA: Diagnosis not present

## 2015-02-04 DIAGNOSIS — M1990 Unspecified osteoarthritis, unspecified site: Secondary | ICD-10-CM | POA: Diagnosis not present

## 2015-02-04 DIAGNOSIS — J449 Chronic obstructive pulmonary disease, unspecified: Secondary | ICD-10-CM | POA: Diagnosis not present

## 2015-02-04 DIAGNOSIS — J45909 Unspecified asthma, uncomplicated: Secondary | ICD-10-CM | POA: Diagnosis not present

## 2015-02-04 DIAGNOSIS — E039 Hypothyroidism, unspecified: Secondary | ICD-10-CM | POA: Diagnosis not present

## 2015-02-04 DIAGNOSIS — Z8701 Personal history of pneumonia (recurrent): Secondary | ICD-10-CM | POA: Diagnosis not present

## 2015-02-06 ENCOUNTER — Emergency Department: Payer: Commercial Managed Care - HMO

## 2015-02-06 ENCOUNTER — Inpatient Hospital Stay: Payer: Commercial Managed Care - HMO

## 2015-02-06 ENCOUNTER — Encounter: Payer: Self-pay | Admitting: *Deleted

## 2015-02-06 ENCOUNTER — Inpatient Hospital Stay
Admission: EM | Admit: 2015-02-06 | Discharge: 2015-02-08 | DRG: 871 | Disposition: A | Discharge status: Home / Self Care | Payer: Commercial Managed Care - HMO | Attending: Internal Medicine | Admitting: Internal Medicine

## 2015-02-06 DIAGNOSIS — Z87891 Personal history of nicotine dependence: Secondary | ICD-10-CM | POA: Diagnosis not present

## 2015-02-06 DIAGNOSIS — Z885 Allergy status to narcotic agent status: Secondary | ICD-10-CM | POA: Diagnosis not present

## 2015-02-06 DIAGNOSIS — I2510 Atherosclerotic heart disease of native coronary artery without angina pectoris: Secondary | ICD-10-CM | POA: Diagnosis present

## 2015-02-06 DIAGNOSIS — J9621 Acute and chronic respiratory failure with hypoxia: Secondary | ICD-10-CM | POA: Diagnosis present

## 2015-02-06 DIAGNOSIS — K219 Gastro-esophageal reflux disease without esophagitis: Secondary | ICD-10-CM | POA: Diagnosis present

## 2015-02-06 DIAGNOSIS — J069 Acute upper respiratory infection, unspecified: Secondary | ICD-10-CM | POA: Diagnosis present

## 2015-02-06 DIAGNOSIS — M810 Age-related osteoporosis without current pathological fracture: Secondary | ICD-10-CM | POA: Diagnosis present

## 2015-02-06 DIAGNOSIS — M7989 Other specified soft tissue disorders: Secondary | ICD-10-CM | POA: Diagnosis present

## 2015-02-06 DIAGNOSIS — E7800 Pure hypercholesterolemia, unspecified: Secondary | ICD-10-CM | POA: Diagnosis present

## 2015-02-06 DIAGNOSIS — M4806 Spinal stenosis, lumbar region: Secondary | ICD-10-CM | POA: Diagnosis present

## 2015-02-06 DIAGNOSIS — L03116 Cellulitis of left lower limb: Secondary | ICD-10-CM | POA: Diagnosis not present

## 2015-02-06 DIAGNOSIS — J45909 Unspecified asthma, uncomplicated: Secondary | ICD-10-CM | POA: Diagnosis present

## 2015-02-06 DIAGNOSIS — M79669 Pain in unspecified lower leg: Secondary | ICD-10-CM | POA: Diagnosis not present

## 2015-02-06 DIAGNOSIS — Z8673 Personal history of transient ischemic attack (TIA), and cerebral infarction without residual deficits: Secondary | ICD-10-CM | POA: Diagnosis not present

## 2015-02-06 DIAGNOSIS — Z66 Do not resuscitate: Secondary | ICD-10-CM | POA: Diagnosis present

## 2015-02-06 DIAGNOSIS — R0602 Shortness of breath: Secondary | ICD-10-CM | POA: Diagnosis not present

## 2015-02-06 DIAGNOSIS — Z7952 Long term (current) use of systemic steroids: Secondary | ICD-10-CM | POA: Diagnosis not present

## 2015-02-06 DIAGNOSIS — A419 Sepsis, unspecified organism: Principal | ICD-10-CM | POA: Diagnosis present

## 2015-02-06 DIAGNOSIS — R0902 Hypoxemia: Secondary | ICD-10-CM | POA: Diagnosis not present

## 2015-02-06 DIAGNOSIS — I129 Hypertensive chronic kidney disease with stage 1 through stage 4 chronic kidney disease, or unspecified chronic kidney disease: Secondary | ICD-10-CM | POA: Diagnosis present

## 2015-02-06 DIAGNOSIS — Z5320 Procedure and treatment not carried out because of patient's decision for unspecified reasons: Secondary | ICD-10-CM | POA: Diagnosis present

## 2015-02-06 DIAGNOSIS — M1990 Unspecified osteoarthritis, unspecified site: Secondary | ICD-10-CM | POA: Diagnosis present

## 2015-02-06 DIAGNOSIS — R509 Fever, unspecified: Secondary | ICD-10-CM

## 2015-02-06 DIAGNOSIS — E0590 Thyrotoxicosis, unspecified without thyrotoxic crisis or storm: Secondary | ICD-10-CM | POA: Diagnosis present

## 2015-02-06 DIAGNOSIS — J189 Pneumonia, unspecified organism: Secondary | ICD-10-CM | POA: Diagnosis not present

## 2015-02-06 DIAGNOSIS — E039 Hypothyroidism, unspecified: Secondary | ICD-10-CM | POA: Diagnosis present

## 2015-02-06 DIAGNOSIS — Z9981 Dependence on supplemental oxygen: Secondary | ICD-10-CM

## 2015-02-06 DIAGNOSIS — J440 Chronic obstructive pulmonary disease with acute lower respiratory infection: Secondary | ICD-10-CM | POA: Diagnosis not present

## 2015-02-06 DIAGNOSIS — Z888 Allergy status to other drugs, medicaments and biological substances status: Secondary | ICD-10-CM

## 2015-02-06 DIAGNOSIS — L0390 Cellulitis, unspecified: Secondary | ICD-10-CM | POA: Diagnosis present

## 2015-02-06 DIAGNOSIS — J441 Chronic obstructive pulmonary disease with (acute) exacerbation: Secondary | ICD-10-CM | POA: Diagnosis not present

## 2015-02-06 DIAGNOSIS — M79662 Pain in left lower leg: Secondary | ICD-10-CM | POA: Diagnosis not present

## 2015-02-06 DIAGNOSIS — M79661 Pain in right lower leg: Secondary | ICD-10-CM | POA: Diagnosis not present

## 2015-02-06 LAB — CBC WITH DIFFERENTIAL/PLATELET
Basophils Absolute: 0.1 10*3/uL (ref 0–0.1)
Basophils Relative: 0 %
EOS ABS: 0 10*3/uL (ref 0–0.7)
EOS PCT: 0 %
HCT: 36.9 % (ref 35.0–47.0)
Hemoglobin: 12.4 g/dL (ref 12.0–16.0)
LYMPHS ABS: 0.7 10*3/uL — AB (ref 1.0–3.6)
LYMPHS PCT: 4 %
MCH: 31.3 pg (ref 26.0–34.0)
MCHC: 33.6 g/dL (ref 32.0–36.0)
MCV: 92.9 fL (ref 80.0–100.0)
MONO ABS: 1 10*3/uL — AB (ref 0.2–0.9)
MONOS PCT: 6 %
Neutro Abs: 15.9 10*3/uL — ABNORMAL HIGH (ref 1.4–6.5)
Neutrophils Relative %: 90 %
PLATELETS: 191 10*3/uL (ref 150–440)
RBC: 3.97 MIL/uL (ref 3.80–5.20)
RDW: 13.2 % (ref 11.5–14.5)
WBC: 17.7 10*3/uL — AB (ref 3.6–11.0)

## 2015-02-06 LAB — COMPREHENSIVE METABOLIC PANEL
ALBUMIN: 4 g/dL (ref 3.5–5.0)
ALT: 15 U/L (ref 14–54)
AST: 28 U/L (ref 15–41)
Alkaline Phosphatase: 55 U/L (ref 38–126)
Anion gap: 10 (ref 5–15)
BUN: 18 mg/dL (ref 6–20)
CHLORIDE: 99 mmol/L — AB (ref 101–111)
CO2: 26 mmol/L (ref 22–32)
CREATININE: 0.99 mg/dL (ref 0.44–1.00)
Calcium: 8.9 mg/dL (ref 8.9–10.3)
GFR calc Af Amer: 58 mL/min — ABNORMAL LOW (ref >60)
GFR, EST NON AFRICAN AMERICAN: 50 mL/min — AB (ref >60)
GLUCOSE: 148 mg/dL — AB (ref 65–99)
Potassium: 5 mmol/L (ref 3.5–5.1)
Sodium: 135 mmol/L (ref 135–145)
Total Bilirubin: 1.6 mg/dL — ABNORMAL HIGH (ref 0.3–1.2)
Total Protein: 7.3 g/dL (ref 6.5–8.1)

## 2015-02-06 LAB — TROPONIN I: Troponin I: 0.09 ng/mL — ABNORMAL HIGH (ref <0.031)

## 2015-02-06 LAB — LACTIC ACID, PLASMA: LACTIC ACID, VENOUS: 1 mmol/L (ref 0.5–2.0)

## 2015-02-06 LAB — INFLUENZA PANEL BY PCR (TYPE A & B)
H1N1FLUPCR: NOT DETECTED
Influenza A By PCR: NEGATIVE
Influenza B By PCR: NEGATIVE

## 2015-02-06 MED ORDER — IPRATROPIUM-ALBUTEROL 0.5-2.5 (3) MG/3ML IN SOLN
RESPIRATORY_TRACT | Status: AC
  Filled 2015-02-06: qty 6

## 2015-02-06 MED ORDER — IPRATROPIUM-ALBUTEROL 0.5-2.5 (3) MG/3ML IN SOLN
3.0000 mL | RESPIRATORY_TRACT | Status: DC
  Administered 2015-02-07 (×4): 3 mL via RESPIRATORY_TRACT
  Filled 2015-02-06 (×4): qty 3

## 2015-02-06 MED ORDER — ALBUTEROL SULFATE (2.5 MG/3ML) 0.083% IN NEBU
2.5000 mg | INHALATION_SOLUTION | RESPIRATORY_TRACT | Status: DC | PRN
  Administered 2015-02-07 – 2015-02-08 (×2): 2.5 mg via RESPIRATORY_TRACT
  Filled 2015-02-06 (×2): qty 3

## 2015-02-06 MED ORDER — PIPERACILLIN-TAZOBACTAM 3.375 G IVPB
3.3750 g | Freq: Once | INTRAVENOUS | Status: AC
  Administered 2015-02-06: 3.375 g via INTRAVENOUS
  Filled 2015-02-06: qty 50

## 2015-02-06 MED ORDER — VANCOMYCIN HCL IN DEXTROSE 750-5 MG/150ML-% IV SOLN
750.0000 mg | INTRAVENOUS | Status: DC
  Administered 2015-02-07: 750 mg via INTRAVENOUS
  Filled 2015-02-06 (×2): qty 150

## 2015-02-06 MED ORDER — IPRATROPIUM-ALBUTEROL 0.5-2.5 (3) MG/3ML IN SOLN
3.0000 mL | Freq: Once | RESPIRATORY_TRACT | Status: DC
  Filled 2015-02-06: qty 3

## 2015-02-06 MED ORDER — IPRATROPIUM-ALBUTEROL 0.5-2.5 (3) MG/3ML IN SOLN
3.0000 mL | Freq: Once | RESPIRATORY_TRACT | Status: AC
  Administered 2015-02-06: 3 mL via RESPIRATORY_TRACT

## 2015-02-06 MED ORDER — SODIUM CHLORIDE 0.9 % IV BOLUS (SEPSIS)
1000.0000 mL | INTRAVENOUS | Status: AC
  Administered 2015-02-06 (×2): 1000 mL via INTRAVENOUS

## 2015-02-06 MED ORDER — VANCOMYCIN HCL IN DEXTROSE 1-5 GM/200ML-% IV SOLN
1000.0000 mg | Freq: Once | INTRAVENOUS | Status: AC
  Administered 2015-02-06: 1000 mg via INTRAVENOUS
  Filled 2015-02-06: qty 200

## 2015-02-06 MED ORDER — SODIUM CHLORIDE 0.9 % IV BOLUS (SEPSIS): 1000.0000 mL | Freq: Once | INTRAVENOUS | Status: AC

## 2015-02-06 MED ORDER — ASPIRIN 81 MG PO CHEW
324.0000 mg | CHEWABLE_TABLET | Freq: Once | ORAL | Status: AC
  Administered 2015-02-06: 324 mg via ORAL
  Filled 2015-02-06: qty 4

## 2015-02-06 MED ORDER — METHYLPREDNISOLONE SODIUM SUCC 125 MG IJ SOLR
125.0000 mg | Freq: Once | INTRAMUSCULAR | Status: AC
  Administered 2015-02-06: 125 mg via INTRAVENOUS
  Filled 2015-02-06: qty 2

## 2015-02-06 NOTE — ED Notes (Signed)
MD at bedside. 

## 2015-02-06 NOTE — H&P (Signed)
[facility] [place] [facility]   PATIENT NAME: Cynthia LimaDaisy [name]    MR#:  [id number]  DATE OF BIRTH:  Mar 18, 1925  DATE OF ADMISSION:  02/06/2015  PRIMARY CARE PHYSICIAN: Mila Merryonald Fisher, MD   REQUESTING/REFERRING PHYSICIAN: Dr. Shaune PollackLord  CHIEF COMPLAINT:  Shortness of breath  HISTORY OF PRESENT ILLNESS:  Cynthia [name]  is a 81 y.o. female with a known history of severe COPD he lives on 2 L of home oxygen, chronically wheezes and short of breath is present into the ED with a chief complaint of worsening of the shortness of breath. Patient had fever today and reporting that she is coughing and bringing up greenish yellow phlegm. Patient is hypoxic in the ED and was placed on 4 L of oxygen via nasal cannula. Patient sees pulmonology as an outpatient. Reporting severe tenderness in the calf muscles of bilateral lower extremities and redness in the left foot area. Denies any chest pain, dizziness or loss of consciousness. Patient is refusing BiPAP or any other breathing masks  PAST MEDICAL HISTORY:   Past Medical History  Diagnosis Date  . Abdominal pain, epigastric   . Aneurysm of aorta (HCC)   . Osteoarthritis   . GERD (gastroesophageal reflux disease)   . Spinal stenosis of lumbar region without neurogenic claudication   . Insomnia   . Hypercholesteremia   . Osteoporosis   . Hypertension   . Hyperthyroidism   . Stomatitis monilial   . CAD (coronary artery disease)   . Renal failure   . UTI symptoms   . Stroke (HCC)   . Allergic drug reaction   . Breathlessness on exertion   . Asthma   . COPD (chronic obstructive pulmonary disease) (HCC)   . Seborrheic keratoses   . Olecranon bursitis   . History of partial colectomy   . Pneumonia   . Hypoxia   . Nocturia   . Vaginal atrophy     PAST SURGICAL HISTOIRY:   Past Surgical History  Procedure Laterality Date  . Multilevel spinal fusion and laminectomy  2010    Due to spinal stenosis Dr. Gerrit Heckaliff  . Rotator  cuff surgery  2007  . Vaginal hysterectomy  1960  . Wrist surgery      lump removed due to pain  . Colon surgery      Hole in colon that was repaired  . Appendectomy      SOCIAL HISTORY:   Social History  Substance Use Topics  . Smoking status: Former Games developermoker  . Smokeless tobacco: Not on file     Comment: quit 11 years ago  . Alcohol Use: 0.0 oz/week    0 Standard drinks or equivalent per week     Comment: 2 oz of wine at night    FAMILY HISTORY:  Hypertension and heart disease runs in her family  DRUG ALLERGIES:   Allergies  Allergen Reactions  . Nitrofurantoin Monohyd Macro Shortness Of Breath  . Levaquin [Levofloxacin] Rash  . Codeine Itching  . Crestor [Rosuvastatin] Other (See Comments)    Reaction:  Dizziness   . Duratuss G [Guaifenesin] Nausea And Vomiting  . Oxycodone-Acetaminophen Itching  . Propoxyphene Itching  . Tramadol Itching  . Morphine Itching and Rash    REVIEW OF SYSTEMS:  CONSTITUTIONAL: Reporting fever, fatigue and weakness.  EYES: No blurred or double vision.  EARS, NOSE, AND THROAT: No tinnitus or ear pain.  RESPIRATORY: States productive cough, acute on chronic shortness of breath, worsening of wheezing or  hemoptysis.  CARDIOVASCULAR: No chest pain, orthopnea, edema.  GASTROINTESTINAL: No nausea, vomiting, diarrhea or abdominal pain.  GENITOURINARY: No dysuria, hematuria.  ENDOCRINE: No polyuria, nocturia,  HEMATOLOGY: No anemia, easy bruising or bleeding SKIN: No rash or lesion. MUSCULOSKELETAL: calf muscle pain and redness of the left foot No joint pain or arthritis.   NEUROLOGIC: No tingling, numbness, weakness.  PSYCHIATRY: No anxiety or depression.   MEDICATIONS AT HOME:   Prior to Admission medications   Medication Sig Start Date End Date Taking? Authorizing Provider  albuterol (PROVENTIL HFA;VENTOLIN HFA) 108 (90 BASE) MCG/ACT inhaler Inhale 2 puffs into the lungs every 6 (six) hours as needed for wheezing or shortness of  breath.    Yes Historical Provider, MD  albuterol (PROVENTIL) (2.5 MG/3ML) 0.083% nebulizer solution Take 2.5 mg by nebulization every 6 (six) hours as needed for wheezing or shortness of breath. Pt mixes with her Atrovent nebulizer solution.   Yes Historical Provider, MD  aspirin EC 81 MG tablet Take 81 mg by mouth daily at 12 noon.    Yes Historical Provider, MD  cholecalciferol (VITAMIN D) 1000 UNITS tablet Take 1,000 Units by mouth daily at 12 noon.   Yes Historical Provider, MD  dextromethorphan-guaiFENesin (MUCINEX DM) 30-600 MG 12hr tablet Take 1 tablet by mouth 2 (two) times daily as needed for cough.   Yes Historical Provider, MD  diclofenac sodium (VOLTAREN) 1 % GEL Apply 2 g topically 4 (four) times daily as needed (for pain).   Yes Historical Provider, MD  ferrous sulfate 325 (65 FE) MG tablet Take 325 mg by mouth daily at 12 noon.   Yes Historical Provider, MD  fexofenadine (ALLEGRA) 180 MG tablet Take 180 mg by mouth daily.   Yes Historical Provider, MD  Fluticasone Furoate-Vilanterol (BREO ELLIPTA) 200-25 MCG/INH AEPB Inhale 1 puff into the lungs daily.   Yes Historical Provider, MD  ibuprofen (ADVIL,MOTRIN) 200 MG tablet Take 600 mg by mouth every 6 (six) hours as needed for mild pain.   Yes Historical Provider, MD  ipratropium (ATROVENT) 0.02 % nebulizer solution Take 0.5 mg by nebulization every 6 (six) hours as needed for wheezing or shortness of breath. Pt mixes with her albuterol nebulizer solution.   Yes Historical Provider, MD  isosorbide mononitrate (IMDUR) 30 MG 24 hr tablet Take 30 mg by mouth daily at 12 noon.    Yes Historical Provider, MD  lansoprazole (PREVACID) 30 MG capsule Take 30 mg by mouth daily.    Yes Historical Provider, MD  levothyroxine (SYNTHROID, LEVOTHROID) 75 MCG tablet Take 75 mcg by mouth daily before breakfast.   Yes Historical Provider, MD  Magnesium Oxide 500 MG TABS Take 500 mg by mouth daily at 12 noon.   Yes Historical Provider, MD   multivitamin-lutein (OCUVITE-LUTEIN) CAPS capsule Take 1 capsule by mouth daily at 12 noon.   Yes Historical Provider, MD  nitroGLYCERIN (NITROSTAT) 0.4 MG SL tablet Place 0.4 mg under the tongue every 5 (five) minutes as needed for chest pain.   Yes Historical Provider, MD  Omega-3 Fatty Acids (FISH OIL) 1000 MG CAPS Take 1,000 mg by mouth daily at 12 noon.    Yes Historical Provider, MD  POTASSIUM PO Take 1 tablet by mouth daily at 12 noon.   Yes Historical Provider, MD  predniSONE (DELTASONE) 10 MG tablet Take 10 mg by mouth daily with breakfast.   Yes Historical Provider, MD  vitamin C (ASCORBIC ACID) 500 MG tablet Take 500 mg by mouth daily at 12 noon.  Yes Historical Provider, MD      VITAL SIGNS:  Blood pressure 149/38, pulse 100, temperature 97.6 F (36.4 C), temperature source Oral, resp. rate 30, height 5\' 1"  (1.549 m), weight 58.06 kg (128 lb), SpO2 97 %.  PHYSICAL EXAMINATION:  GENERAL:  81 y.o.-year-old patient lying in the bed with mild  respiratory distress.  EYES: Pupils equal, round, reactive to light and accommodation. No scleral icterus. Extraocular muscles intact.  HEENT: Head atraumatic, normocephalic. Oropharynx and nasopharynx clear.  NECK:  Supple, no jugular venous distention. No thyroid enlargement, no tenderness.  LUNGS: Diminished breath sounds bilaterally, diffuse wheezing , exertional dyspnea while resting, , no rales,rhonchi or crepitation. No use of accessory muscles of respiration.  CARDIOVASCULAR: S1, S2 normal. No murmurs, rubs, or gallops.  ABDOMEN: Soft, nontender, nondistended. Bowel sounds present. No organomegaly or mass.  EXTREMITIES:  bilateral lower extremity calf muscle tenderness is present with the left foot erythema and tenderness. No pedal edema, cyanosis, or clubbing.  NEUROLOGIC: Cranial nerves II through XII are intact. Muscle strength 5/5 in all extremities. Sensation intact. Gait not checked.  PSYCHIATRIC: The patient is alert and  oriented x 3.  SKIN: No obvious rash, lesion, or ulcer.   LABORATORY PANEL:   CBC  Recent Labs Lab 02/06/15 1743  WBC 17.7*  HGB 12.4  HCT 36.9  PLT 191   ------------------------------------------------------------------------------------------------------------------  Chemistries   Recent Labs Lab 02/06/15 1743  NA 135  K 5.0  CL 99*  CO2 26  GLUCOSE 148*  BUN 18  CREATININE 0.99  CALCIUM 8.9  AST 28  ALT 15  ALKPHOS 55  BILITOT 1.6*   ------------------------------------------------------------------------------------------------------------------  Cardiac Enzymes  Recent Labs Lab 02/06/15 1743  TROPONINI 0.09*   ------------------------------------------------------------------------------------------------------------------  RADIOLOGY:  Dg Chest 2 View  02/06/2015  CLINICAL DATA:  81 year old female with COPD presenting with shortness of breath. EXAM: CHEST  2 VIEW COMPARISON:  Radiograph dated 12/29/2014 FINDINGS: Two views of the chest demonstrate emphysematous changes of the lungs. No focal consolidation, pleural effusion, or pneumothorax. The cardiac silhouette is within normal limits. There is osteopenia with degenerative changes of the spine. IMPRESSION: No active cardiopulmonary disease. Electronically Signed   By: Elgie Collard M.D.   On: 02/06/2015 18:34    EKG:   Orders placed or performed during the hospital encounter of 02/06/15  . EKG 12-Lead  . EKG 12-Lead    IMPRESSION AND PLAN:   1. Acute on chronic hypoxic respiratory failure secondary to acute exacerbation of COPD-severe Will provide Solu-Medrol, nebulizer treatments IV antibiotics Currently patient is on oxygen 4 L via nasal cannula, lives on 2 L of oxygen via nasal cannula at home Patient has severe to end-stage COPD, chronically short of breath, ambulates very short distances less than 100 feet at home and sees pulmonology as an outpatient, we will put a consult to Dr.Sadat  [facility]  2. Sepsis possibly from left foot cellulitis- patient meets criteria for sepsis with fever, leukocytosis and tachycardia Blood cultures 2, urine culture and sputum culture are ordered Will start the patient on broad-spectrum IV antibiotics Zosyn and vancomycin and antibiotics can be narrowed down to based on the culture results  3. Bilateral lower extremity calf muscle tenderness Rule out DVT with bilateral lower extremity Dopplers-ordered stat  4. Elevated troponin with shortness of breath Cycle cardiac biomarkers and monitor patient on telemetry   5. Hypertension, hypothyroidism and coronary artery disease    Provide GI and DVT prophylaxis   All the records are reviewed  and case discussed with ED provider. Management plans discussed with the patient, family and they are in agreement. Patient and her niece is are aware that patient is at high risk for respiratory arrest   CODE STATUS: DO NOT RESUSCITATE, refusing BiPAP too, niece Arline Asp cater is her healthcare power of attorney  TOTAL CRITICAL CARE TIME TAKING CARE OF THIS PATIENT: 45 minutes.    Ramonita Lab M.D on 02/06/2015 at 10:04 PM  Between 7am to 6pm - Pager - [phone]  After 6pm go to [email or web address] - password [email or web address] [facility]  [facility] [place]  Office  [phone]  CC: Primary care physician; Mila Merry, MD

## 2015-02-06 NOTE — ED Notes (Signed)
Pt to triage via wheelchair.  Pt has diff breathing today.  Intermittent chest pain.  No n/v/d.  Using nebs with some relief.

## 2015-02-06 NOTE — ED Provider Notes (Signed)
[facility] Emergency Department Provider Note   ____________________________________________  Time seen:  I have reviewed the triage vital signs and the triage nursing note.  HISTORY  Chief Complaint Shortness of Breath   Historian History initially limited due to patient's acute respiratory distress Patient and niece  HPI Cynthia [name] is a 81 y.o. female with a history of COPD and home O2 uses here for symptoms of increased work of breathing and wheezing for approximately 1-2 days. She's been using nebulizer treatments, but is still having trouble breathing. She has had a fever today. She's been coughing without any productive sputum. Generalized weakness without focal weakness. No headache or sore throat. Mild chest pressure without specific chest pain.    Past Medical History  Diagnosis Date  . Abdominal pain, epigastric   . Aneurysm of aorta (HCC)   . Osteoarthritis   . GERD (gastroesophageal reflux disease)   . Spinal stenosis of lumbar region without neurogenic claudication   . Insomnia   . Hypercholesteremia   . Osteoporosis   . Hypertension   . Hyperthyroidism   . Stomatitis monilial   . CAD (coronary artery disease)   . Renal failure   . UTI symptoms   . Stroke (HCC)   . Allergic drug reaction   . Breathlessness on exertion   . Asthma   . COPD (chronic obstructive pulmonary disease) (HCC)   . Seborrheic keratoses   . Olecranon bursitis   . History of partial colectomy   . Pneumonia   . Hypoxia   . Nocturia   . Vaginal atrophy     Patient Active Problem List   Diagnosis Date Noted  . Atherosclerosis of coronary artery 11/11/2014  . Centriacinar emphysema (HCC) 11/11/2014  . CAFL (chronic airflow limitation) (HCC) 11/11/2014  . Polypharmacy 11/11/2014  . H/O hemicolectomy 11/11/2014  . Adult hypothyroidism 11/11/2014  . Hypoxia 11/11/2014  . Bursitis of elbow 11/11/2014  . Degenerative arthritis of hip 11/11/2014  . Basal  cell papilloma 11/11/2014  . H/O respiratory system disease 07/29/2014  . Inflammation of sacroiliac joint (HCC) 09/29/2013  . AA (aortic aneurysm) (HCC) 07/19/2009  . Arthritis of hand, degenerative 11/12/2008  . Acid reflux 11/27/2007  . Lumbar canal stenosis 09/16/2007  . Hypercholesteremia 01/31/2007  . BP (high blood pressure) 11/20/2005  . OP (osteoporosis) 11/20/2005    Past Surgical History  Procedure Laterality Date  . Multilevel spinal fusion and laminectomy  2010    Due to spinal stenosis Dr. Gerrit Heckaliff  . Rotator cuff surgery  2007  . Vaginal hysterectomy  1960  . Wrist surgery      lump removed due to pain  . Colon surgery      Hole in colon that was repaired  . Appendectomy      Current Outpatient Rx  Name  Route  Sig  Dispense  Refill  . acetaminophen (TYLENOL) 500 MG tablet   Oral   Take 500 mg by mouth every 6 (six) hours as needed.         Marland Kitchen. albuterol (PROVENTIL HFA;VENTOLIN HFA) 108 (90 BASE) MCG/ACT inhaler   Inhalation   Inhale into the lungs every 6 (six) hours as needed for wheezing or shortness of breath.         Marland Kitchen. albuterol (PROVENTIL) (2.5 MG/3ML) 0.083% nebulizer solution   Nebulization   Take 2.5 mg by nebulization every 6 (six) hours as needed for wheezing or shortness of breath.         .Marland Kitchen  ALPRAZolam (XANAX) 0.5 MG tablet   Oral   Take 0.5 mg by mouth at bedtime as needed for anxiety.         Marland Kitchen aspirin 81 MG tablet               . aspirin EC 81 MG tablet   Oral   Take 81 mg by mouth daily.         . Benzonatate 150 MG CAPS   Oral   Take 100 mg by mouth.         . calcium carbonate (OS-CAL) 600 MG TABS tablet   Oral   Take 600 mg by mouth 2 (two) times daily with a meal.         . Cranberry Fruit Concentrate 12600 MG CAPS   Oral   Take by mouth daily.         Marland Kitchen estradiol (ESTRACE VAGINAL) 0.1 MG/GM vaginal cream   Vaginal   Place 1 Applicatorful vaginally at bedtime.         . Ferrous Sulfate (IRON) 28 MG  TABS   Oral   Take 28 mg by mouth daily.         . fexofenadine (ALLEGRA) 180 MG tablet   Oral   Take 180 mg by mouth daily.         Marland Kitchen ipratropium (ATROVENT) 0.02 % nebulizer solution   Nebulization   Take 0.5 mg by nebulization 4 (four) times daily.         . isosorbide mononitrate (IMDUR) 30 MG 24 hr tablet   Oral   Take by mouth.         . lansoprazole (PREVACID) 30 MG capsule   Oral   Take 30 mg by mouth daily at 12 noon.         Marland Kitchen levothyroxine (SYNTHROID, LEVOTHROID) 75 MCG tablet   Oral   Take 75 mcg by mouth daily before breakfast.         . Magnesium Gluconate 250 MG TABS   Oral   Take by mouth 2 (two) times daily.         . Melatonin 5 MG CAPS   Oral   Take by mouth at bedtime.         . meloxicam (MOBIC) 15 MG tablet   Oral   Take 15 mg by mouth daily.         . mometasone-formoterol (DULERA) 100-5 MCG/ACT AERO   Inhalation   Inhale 2 puffs into the lungs 2 (two) times daily.         . Multiple Vitamin (MULTI-VITAMINS) TABS   Oral   Take by mouth.         . nitroGLYCERIN (NITROSTAT) 0.4 MG SL tablet   Sublingual   Place 0.4 mg under the tongue every 5 (five) minutes as needed for chest pain.         . Omega-3 Fatty Acids (FISH OIL) 1000 MG CAPS   Oral   Take by mouth daily.         . OXYGEN   Inhalation   Inhale into the lungs as needed (1  2 LPM Inhalation PRN).         . Potassium Gluconate 2 MEQ TABS   Oral   Take by mouth daily.         . sucralfate (CARAFATE) 1 G tablet   Oral   Take by mouth.         Marland Kitchen  tiotropium (SPIRIVA) 18 MCG inhalation capsule   Inhalation   Place into inhaler and inhale.         . vitamin C (ASCORBIC ACID) 500 MG tablet   Oral   Take 500 mg by mouth daily.           Allergies Nitrofurantoin monohyd macro; Levaquin; Codeine; Crestor; Duratuss g; Oxycodone-acetaminophen; Propoxyphene; Tramadol; and Morphine  No family history on file.  Social History Social History   Substance Use Topics  . Smoking status: Former Games developer  . Smokeless tobacco: None     Comment: quit 11 years ago  . Alcohol Use: 0.0 oz/week    0 Standard drinks or equivalent per week     Comment: 2 oz of wine at night    Review of Systems  Constitutional: positivefor fever. Eyes: Negative for visual changes. ENT: Negative for sore throat. Cardiovascular: positive for chest pressure Respiratory: positivefor shortness of breath. Gastrointestinal: Negative for abdominal pain, vomiting and diarrhea. Genitourinary: Negative for dysuria. Musculoskeletal: Negative for back pain. Skin: Negative for rash. Neurological: Negative for headache. 10 point Review of Systems otherwise negative ____________________________________________   PHYSICAL EXAM:  VITAL SIGNS: ED Triage Vitals  Enc Vitals Group     BP 02/06/15 1729 132/62 mmHg     Pulse Rate 02/06/15 1729 111     Resp 02/06/15 1729 28     Temp 02/06/15 1729 101 F (38.3 C)     Temp Source 02/06/15 1729 Oral     SpO2 02/06/15 1729 91 %     Weight 02/06/15 1729 128 lb (58.06 kg)     Height 02/06/15 1729  (1.549 m)     Head Cir --      Peak Flow --      Pain Score 02/06/15 1732 5     Pain Loc --      Pain Edu? --      Excl. in GC? --      Constitutional: moderate respiratory distress with wheezing.alert and oriented. Eyes: Conjunctivae are normal. PERRL. Normal extraocular movements. ENT   Head: Normocephalic and atraumatic.   Nose: No congestion/rhinnorhea.   Mouth/Throat: Mucous membranes are moderately dry.   Neck: No stridor. Cardiovascular/Chest: tachycardic regular.No murmurs, rubs, or gallops. Respiratory: tachypnea With some retractions. Decreased breath sounds throughout with significant wheezing in all lung fields. No rhonchi.speaking only a few words at a time Gastrointestinal: Soft. No distention, no guarding, no rebound. Nontender   Genitourinary/rectal:Deferred Musculoskeletal:  Nontender with normal range of motion in all extremities. No joint effusions.  No lower extremity tenderness.  No edema. Neurologic:  Normal speech and language. No gross or focal neurologic deficits are appreciated. Skin:  Skin is warm, dry and intact. No rash noted. Psychiatric: Mood and affect are normal. Speech and behavior are normal. Patient exhibits appropriate insight and judgment.  ____________________________________________   EKG I, Governor Rooks, MD, the attending physician have personally viewed and interpreted all ECGs.   99 bpm. Normal sinus rhythm. Narrow QRS. Normal axis. Nonspecific ST and T-wave ____________________________________________  LABS (pertinent positives/negatives)  Comprehensive metabolic panel without significant abnormality White blood cell count 17.7 with left shift. Hemoglobin 12.4 platelet count 191 Lactate 1.0 Troponin 0.09 Blood cultures 2 sent Urinalysis pending ____________________________________________  RADIOLOGY All Xrays were viewed by me. Imaging interpreted by Radiologist.  Chest x-ray two-view: No active cardiopulmonary disease. __________________________________________  PROCEDURES  Procedure(s) performed: None  Critical Care performed: CRITICAL CARE Performed by: Governor Rooks   Total critical care time:  30 minutes  Critical care time was exclusive of separately billable procedures and treating other patients.  Critical care was necessary to treat or prevent imminent or life-threatening deterioration.  Critical care was time spent personally by me on the following activities: development of treatment plan with patient and/or surrogate as well as nursing, discussions with consultants, evaluation of patient's response to treatment, examination of patient, obtaining history from patient or surrogate, ordering and performing treatments and interventions, ordering and review of laboratory studies, ordering and review of  radiographic studies, pulse oximetry and re-evaluation of patient's condition.   ____________________________________________   ED COURSE / ASSESSMENT AND PLAN  CONSULTATIONS: hospitalist, Dr. Amado Coe for admission  Pertinent labs & imaging results that were available during my care of the patient were reviewed by me and considered in my medical decision making (see chart for details).  Patient arrived in a fair amount of respiratory distress with tachypnea, retractions, and wheezing appearing like a COPD exacerbation, however with a fever and elevated white blood cell count, concern for possible respiratory infection.   Patient placed on sepsis protocol due to fever, hypoxia, and tachycardia.  She was started on IV fluid bolus, however careful hydration given minimally elevated troponin, and concern for possible development of congestive heart failure. She's had no hypotension, and so no reason to volume overload this patient.  Patient wears home 2 L, was 91% on 3 L and placed up to 5 L making approximately 95%.  After 3 stacked DuoNeb treatments, patient's lung sounds were significantly improved, but still extremely wheezy. Patient will need hospital treatment for COPD exacerbation. Patient was given vancomycin and Zosyn out of concern for possible sepsis. Her chest x-ray was clear, however lungs seem the most likely source. Influenza panel was sent, and this result was pending.  Urinalysis is also pending.  Patient / Family / Caregiver informed of clinical course, medical decision-making process, and agree with plan.  .  ___________________________________________   FINAL CLINICAL IMPRESSION(S) / ED DIAGNOSES   Final diagnoses:  COPD exacerbation (HCC)  Hypoxia  Fever, unspecified fever cause  sepsis, likely upper respiratory infection as a source     Governor Rooks, MD 02/06/15 2047

## 2015-02-06 NOTE — Progress Notes (Signed)
ANTIBIOTIC CONSULT NOTE - INITIAL  Pharmacy Consult for vancomycin Indication: sepsis  Allergies  Allergen Reactions  . Nitrofurantoin Monohyd Macro Shortness Of Breath  . Levaquin [Levofloxacin] Rash  . Codeine Itching  . Crestor [Rosuvastatin] Other (See Comments)    Reaction:  Dizziness   . Duratuss G [Guaifenesin] Nausea And Vomiting  . Oxycodone-Acetaminophen Itching  . Propoxyphene Itching  . Tramadol Itching  . Morphine Itching and Rash    Patient Measurements: Height: 5\' 1"  (154.9 cm) Weight: 128 lb (58.06 kg) IBW/kg (Calculated) : 47.8 Adjusted Body Weight: 51.6 kg  Vital Signs: Temp: 97.6 F (36.4 C) (12/25 1923) Temp Source: Oral (12/25 1923) BP: 110/57 mmHg (12/25 2300) Pulse Rate: 78 (12/25 2300) Intake/Output from previous day:   Intake/Output from this shift:    Labs:  Recent Labs  02/06/15 1743  WBC 17.7*  HGB 12.4  PLT 191  CREATININE 0.99   Estimated Creatinine Clearance: 32.8 mL/min (by C-G formula based on Cr of 0.99). No results for input(s): VANCOTROUGH, VANCOPEAK, VANCORANDOM, GENTTROUGH, GENTPEAK, GENTRANDOM, TOBRATROUGH, TOBRAPEAK, TOBRARND, AMIKACINPEAK, AMIKACINTROU, AMIKACIN in the last 72 hours.   Microbiology: No results found for this or any previous visit (from the past 720 hour(s)).  Medical History: Past Medical History  Diagnosis Date  . Abdominal pain, epigastric   . Aneurysm of aorta (HCC)   . Osteoarthritis   . GERD (gastroesophageal reflux disease)   . Spinal stenosis of lumbar region without neurogenic claudication   . Insomnia   . Hypercholesteremia   . Osteoporosis   . Hypertension   . Hyperthyroidism   . Stomatitis monilial   . CAD (coronary artery disease)   . Renal failure   . UTI symptoms   . Stroke (HCC)   . Allergic drug reaction   . Breathlessness on exertion   . Asthma   . COPD (chronic obstructive pulmonary disease) (HCC)   . Seborrheic keratoses   . Olecranon bursitis   . History of  partial colectomy   . Pneumonia   . Hypoxia   . Nocturia   . Vaginal atrophy     Medications:  Infusions:   Assessment:  Vd 36.1 L, Ke 0.031 hr-1, T1/2 22 hr  Goal of Therapy:  Vancomycin trough level 15-20 mcg/ml  Plan:  Expected duration 7 days with resolution of temperature and/or normalization of WBC. Vancomycin 1 gm IV x 1 followed by 750 mg IV Q24H, predicted trough 18 mcg/mL. Pharmacy will continue to follow and adjust as needed to maintain trough 15 to 20 mcg/mL.  Carola FrostNathan A Frady Taddeo, Pharm.D., BCPS Clinical Pharmacist 02/06/2015,11:45 PM

## 2015-02-07 DIAGNOSIS — J189 Pneumonia, unspecified organism: Secondary | ICD-10-CM

## 2015-02-07 DIAGNOSIS — J441 Chronic obstructive pulmonary disease with (acute) exacerbation: Secondary | ICD-10-CM

## 2015-02-07 LAB — URINALYSIS COMPLETE WITH MICROSCOPIC (ARMC ONLY)
BILIRUBIN URINE: NEGATIVE
Bacteria, UA: NONE SEEN
GLUCOSE, UA: 150 mg/dL — AB
LEUKOCYTES UA: NEGATIVE
NITRITE: NEGATIVE
Protein, ur: NEGATIVE mg/dL
SPECIFIC GRAVITY, URINE: 1.018 (ref 1.005–1.030)
pH: 5 (ref 5.0–8.0)

## 2015-02-07 LAB — CREATININE, SERUM
CREATININE: 0.97 mg/dL (ref 0.44–1.00)
GFR, EST AFRICAN AMERICAN: 59 mL/min — AB (ref >60)
GFR, EST NON AFRICAN AMERICAN: 51 mL/min — AB (ref >60)

## 2015-02-07 LAB — CBC
HCT: 32.6 % — ABNORMAL LOW (ref 35.0–47.0)
HEMOGLOBIN: 10.9 g/dL — AB (ref 12.0–16.0)
MCH: 31.5 pg (ref 26.0–34.0)
MCHC: 33.4 g/dL (ref 32.0–36.0)
MCV: 94.3 fL (ref 80.0–100.0)
Platelets: 165 10*3/uL (ref 150–440)
RBC: 3.45 MIL/uL — ABNORMAL LOW (ref 3.80–5.20)
RDW: 13 % (ref 11.5–14.5)
WBC: 13.7 10*3/uL — AB (ref 3.6–11.0)

## 2015-02-07 LAB — LACTIC ACID, PLASMA: Lactic Acid, Venous: 1 mmol/L (ref 0.5–2.0)

## 2015-02-07 LAB — TROPONIN I
Troponin I: 0.03 ng/mL (ref <0.031)
Troponin I: 0.03 ng/mL (ref <0.031)
Troponin I: 0.03 ng/mL (ref <0.031)

## 2015-02-07 MED ORDER — ACETAMINOPHEN 325 MG PO TABS: 650.0000 mg | ORAL_TABLET | Freq: Four times a day (QID) | ORAL | Status: DC | PRN

## 2015-02-07 MED ORDER — PANTOPRAZOLE SODIUM 40 MG PO TBEC
40.0000 mg | DELAYED_RELEASE_TABLET | Freq: Every day | ORAL | Status: DC
  Administered 2015-02-07 – 2015-02-08 (×2): 40 mg via ORAL
  Filled 2015-02-07 (×2): qty 1

## 2015-02-07 MED ORDER — ALUM & MAG HYDROXIDE-SIMETH 200-200-20 MG/5ML PO SUSP
30.0000 mL | Freq: Four times a day (QID) | ORAL | Status: DC | PRN
  Administered 2015-02-07: 30 mL via ORAL

## 2015-02-07 MED ORDER — SODIUM CHLORIDE 0.9 % IV SOLN
INTRAVENOUS | Status: AC
  Administered 2015-02-07: via INTRAVENOUS

## 2015-02-07 MED ORDER — SODIUM CHLORIDE 0.9 % IJ SOLN
3.0000 mL | Freq: Two times a day (BID) | INTRAMUSCULAR | Status: DC
  Administered 2015-02-07 – 2015-02-08 (×4): 3 mL via INTRAVENOUS

## 2015-02-07 MED ORDER — ISOSORBIDE MONONITRATE ER 30 MG PO TB24
30.0000 mg | ORAL_TABLET | Freq: Every day | ORAL | Status: DC
  Administered 2015-02-07 – 2015-02-08 (×2): 30 mg via ORAL
  Filled 2015-02-07 (×2): qty 1

## 2015-02-07 MED ORDER — ASPIRIN EC 81 MG PO TBEC
81.0000 mg | DELAYED_RELEASE_TABLET | Freq: Every day | ORAL | Status: DC
  Administered 2015-02-07 – 2015-02-08 (×2): 81 mg via ORAL
  Filled 2015-02-07 (×2): qty 1

## 2015-02-07 MED ORDER — PIPERACILLIN-TAZOBACTAM 3.375 G IVPB
3.3750 g | Freq: Three times a day (TID) | INTRAVENOUS | Status: DC
  Administered 2015-02-07 – 2015-02-08 (×4): 3.375 g via INTRAVENOUS
  Filled 2015-02-07 (×6): qty 50

## 2015-02-07 MED ORDER — CETYLPYRIDINIUM CHLORIDE 0.05 % MT LIQD
7.0000 mL | Freq: Two times a day (BID) | OROMUCOSAL | Status: DC
  Administered 2015-02-07 (×2): 7 mL via OROMUCOSAL

## 2015-02-07 MED ORDER — ENOXAPARIN SODIUM 40 MG/0.4ML ~~LOC~~ SOLN
40.0000 mg | Freq: Every day | SUBCUTANEOUS | Status: DC
  Administered 2015-02-07 (×2): 40 mg via SUBCUTANEOUS
  Filled 2015-02-07 (×2): qty 0.4

## 2015-02-07 MED ORDER — METHYLPREDNISOLONE SODIUM SUCC 125 MG IJ SOLR
60.0000 mg | Freq: Two times a day (BID) | INTRAMUSCULAR | Status: DC
Start: 2015-02-08 — End: 2015-02-07

## 2015-02-07 MED ORDER — ACETAMINOPHEN 650 MG RE SUPP: 650.0000 mg | Freq: Four times a day (QID) | RECTAL | Status: DC | PRN

## 2015-02-07 MED ORDER — ONDANSETRON HCL 4 MG/2ML IJ SOLN: 4.0000 mg | Freq: Four times a day (QID) | INTRAMUSCULAR | Status: DC | PRN

## 2015-02-07 MED ORDER — ONDANSETRON HCL 4 MG PO TABS: 4.0000 mg | ORAL_TABLET | Freq: Four times a day (QID) | ORAL | Status: DC | PRN

## 2015-02-07 MED ORDER — IPRATROPIUM-ALBUTEROL 0.5-2.5 (3) MG/3ML IN SOLN
3.0000 mL | Freq: Four times a day (QID) | RESPIRATORY_TRACT | Status: DC
  Administered 2015-02-07 – 2015-02-08 (×4): 3 mL via RESPIRATORY_TRACT
  Filled 2015-02-07 (×4): qty 3

## 2015-02-07 MED ORDER — LORATADINE 10 MG PO TABS
10.0000 mg | ORAL_TABLET | Freq: Every day | ORAL | Status: DC
  Administered 2015-02-07 – 2015-02-08 (×2): 10 mg via ORAL
  Filled 2015-02-07 (×2): qty 1

## 2015-02-07 MED ORDER — METHYLPREDNISOLONE SODIUM SUCC 40 MG IJ SOLR
40.0000 mg | Freq: Two times a day (BID) | INTRAMUSCULAR | Status: DC
  Administered 2015-02-08 (×2): 40 mg via INTRAVENOUS
  Filled 2015-02-07 (×2): qty 1

## 2015-02-07 MED ORDER — CHLORHEXIDINE GLUCONATE 0.12 % MT SOLN
15.0000 mL | Freq: Two times a day (BID) | OROMUCOSAL | Status: DC
  Administered 2015-02-07 – 2015-02-08 (×3): 15 mL via OROMUCOSAL
  Filled 2015-02-07 (×4): qty 15

## 2015-02-07 MED ORDER — METHYLPREDNISOLONE SODIUM SUCC 125 MG IJ SOLR
60.0000 mg | Freq: Four times a day (QID) | INTRAMUSCULAR | Status: DC
  Administered 2015-02-07 (×3): 60 mg via INTRAVENOUS
  Filled 2015-02-07 (×4): qty 2

## 2015-02-07 MED ORDER — LEVOTHYROXINE SODIUM 75 MCG PO TABS
75.0000 ug | ORAL_TABLET | Freq: Every day | ORAL | Status: DC
  Administered 2015-02-07 – 2015-02-08 (×2): 75 ug via ORAL
  Filled 2015-02-07 (×2): qty 1

## 2015-02-07 MED ORDER — NITROGLYCERIN 0.4 MG SL SUBL: 0.4000 mg | SUBLINGUAL_TABLET | SUBLINGUAL | Status: DC | PRN

## 2015-02-07 MED ORDER — DOCUSATE SODIUM 100 MG PO CAPS
100.0000 mg | ORAL_CAPSULE | Freq: Two times a day (BID) | ORAL | Status: DC
Start: 2015-02-07 — End: 2015-02-08
  Administered 2015-02-07 – 2015-02-08 (×3): 100 mg via ORAL
  Filled 2015-02-07 (×3): qty 1

## 2015-02-07 NOTE — Progress Notes (Signed)
[facility] - [place] at [facility]   PATIENT NAME: Cynthia [name]    MR#:  [id number]  DATE OF BIRTH:  Apr 21, 1925  SUBJECTIVE:  CHIEF COMPLAINT:   Chief Complaint  Patient presents with  . Shortness of Breath   - Breathing is improving. Now down to 2l o2 - left foot cellulitis improving - developed indigestion after lunch today  REVIEW OF SYSTEMS:  Review of Systems  Constitutional: Negative for fever and chills.  HENT: Negative for ear discharge, ear pain and nosebleeds.   Respiratory: Positive for cough, shortness of breath and wheezing.   Cardiovascular: Positive for orthopnea. Negative for chest pain, palpitations and leg swelling.  Gastrointestinal: Negative for nausea, vomiting, abdominal pain, diarrhea and constipation.  Genitourinary: Negative for dysuria.  Musculoskeletal: Positive for joint pain.  Neurological: Positive for weakness. Negative for dizziness, tremors, speech change, focal weakness, seizures and headaches.  Psychiatric/Behavioral: Negative for depression.    DRUG ALLERGIES:   Allergies  Allergen Reactions  . Nitrofurantoin Monohyd Macro Shortness Of Breath  . Levaquin [Levofloxacin] Rash  . Codeine Itching  . Crestor [Rosuvastatin] Other (See Comments)    Reaction:  Dizziness   . Duratuss G [Guaifenesin] Nausea And Vomiting  . Oxycodone-Acetaminophen Itching  . Propoxyphene Itching  . Tramadol Itching  . Morphine Itching and Rash    VITALS:  Blood pressure 106/54, pulse 86, temperature 97.4 F (36.3 C), temperature source Oral, resp. rate 20, height  (1.549 m), weight 62.37 kg (137 lb 8 oz), SpO2 98 %.  PHYSICAL EXAMINATION:  Physical Exam  GENERAL:  81 y.o.-year-old patient lying in the bed with no acute distress. Appears tachypneic but according to her niece that is her baseline. EYES: Pupils equal, round, reactive to light and accommodation. No scleral icterus. Extraocular muscles intact.  HEENT: Head  atraumatic, normocephalic. Oropharynx and nasopharynx clear.  NECK:  Supple, no jugular venous distention. No thyroid enlargement, no tenderness.  LUNGS: Appears tachypneic. Scant breath sounds at the bases with scattered wheezing. No rales, rhonchi or crackles. Using accessory muscles to breathe. Able to finish sentences in full. CARDIAC: S1, S2 normal. No rubs, or gallops. 3/6 systolic murmur at the bases ABDOMEN: Soft, nontender, nondistended. Bowel sounds present. No organomegaly or mass.  EXTREMITIES: No pedal edema, cyanosis, or clubbing. Left foot the lateral 3 toes are slightly swollen, tender and erythematous and some dorsal erythema. NEUROLOGIC: Cranial nerves II through XII are intact. Muscle strength 5/5 in all extremities. Sensation intact. Gait not checked.  PSYCHIATRIC: The patient is alert and oriented x 3.  SKIN: No obvious rash, lesion, or ulcer.    LABORATORY PANEL:   CBC  Recent Labs Lab 02/07/15 0030  WBC 13.7*  HGB 10.9*  HCT 32.6*  PLT 165   ------------------------------------------------------------------------------------------------------------------  Chemistries   Recent Labs Lab 02/06/15 1743 02/07/15 0030  NA 135  --   K 5.0  --   CL 99*  --   CO2 26  --   GLUCOSE 148*  --   BUN 18  --   CREATININE 0.99 0.97  CALCIUM 8.9  --   AST 28  --   ALT 15  --   ALKPHOS 55  --   BILITOT 1.6*  --    ------------------------------------------------------------------------------------------------------------------  Cardiac Enzymes  Recent Labs Lab 02/07/15 1216  TROPONINI 0.03   ------------------------------------------------------------------------------------------------------------------  RADIOLOGY:  Dg Chest 2 View  02/06/2015  CLINICAL DATA:  81 year old female with COPD presenting with shortness of breath. EXAM: CHEST  2 VIEW COMPARISON:  Radiograph dated 12/29/2014 FINDINGS: Two views of the chest demonstrate emphysematous changes of  the lungs. No focal consolidation, pleural effusion, or pneumothorax. The cardiac silhouette is within normal limits. There is osteopenia with degenerative changes of the spine. IMPRESSION: No active cardiopulmonary disease. Electronically Signed   By: Elgie Collard M.D.   On: 02/06/2015 18:34   US Venous Img Lower Bilateral  02/06/2015  CLINICAL DATA:  Subacute onset of bilateral calf tenderness and shortness of breath. Initial encounter. EXAM: BILATERAL LOWER EXTREMITY VENOUS DOPPLER ULTRASOUND TECHNIQUE: Gray-scale sonography with graded compression, as well as color Doppler and duplex ultrasound were performed to evaluate the lower extremity deep venous systems from the level of the common femoral vein and including the common femoral, femoral, profunda femoral, popliteal and calf veins including the posterior tibial, peroneal and gastrocnemius veins when visible. The superficial great saphenous vein was also interrogated. Spectral Doppler was utilized to evaluate flow at rest and with distal augmentation maneuvers in the common femoral, femoral and popliteal veins. COMPARISON:  None. FINDINGS: RIGHT LOWER EXTREMITY Common Femoral Vein: No evidence of thrombus. Normal compressibility, respiratory phasicity and response to augmentation. Saphenofemoral Junction: No evidence of thrombus. Normal compressibility and flow on color Doppler imaging. Profunda Femoral Vein: No evidence of thrombus. Normal compressibility and flow on color Doppler imaging. Femoral Vein: No evidence of thrombus. Normal compressibility, respiratory phasicity and response to augmentation. Popliteal Vein: No evidence of thrombus. Normal compressibility, respiratory phasicity and response to augmentation. Calf Veins: No evidence of thrombus. Normal compressibility and flow on color Doppler imaging. Superficial Great Saphenous Vein: No evidence of thrombus. Normal compressibility and flow on color Doppler imaging. Venous Reflux:  None.  Other Findings:  None. LEFT LOWER EXTREMITY Common Femoral Vein: No evidence of thrombus. Normal compressibility, respiratory phasicity and response to augmentation. Saphenofemoral Junction: No evidence of thrombus. Normal compressibility and flow on color Doppler imaging. Profunda Femoral Vein: No evidence of thrombus. Normal compressibility and flow on color Doppler imaging. Femoral Vein: No evidence of thrombus. Normal compressibility, respiratory phasicity and response to augmentation. Popliteal Vein: No evidence of thrombus. Normal compressibility, respiratory phasicity and response to augmentation. Calf Veins: No evidence of thrombus. Normal compressibility and flow on color Doppler imaging. Superficial Great Saphenous Vein: No evidence of thrombus. Normal compressibility and flow on color Doppler imaging. Venous Reflux:  None. Other Findings:  None. IMPRESSION: No evidence of deep venous thrombosis. Electronically Signed   By: Roanna Raider M.D.   On: 02/06/2015 23:30    EKG:   Orders placed or performed during the hospital encounter of 02/06/15  . EKG 12-Lead  . EKG 12-Lead    ASSESSMENT AND PLAN:   81y/o F with COPD on 2L home oxygen, chronic tachypnea and wheezing, GERD, HTN, CAD, CKD, Asthma, osteoporosis presents to the hospital secondary to worsening breathing and also left foot swelling and erythema.  #1 acute on chronic hypoxic respiratory failure-secondary to COPD exacerbation. -Was on 4 L oxygen yesterday, weaned down to 2 L today. -Continue IV steroids. Decreased the dose. -Continue nebulizer treatments  #2 left foot cellulitis-improving with IV antibiotics. -On vancomycin and Zosyn. - blood cultures are pending -Keep the foot elevated. -Doppler is negative for any DVT.  #3 hypertension-continue home medications. On imdur  #4 Hypothyroidism- synthroid  #5 GERD- protonix  #6 DVT Prophylaxis- lovenox    All the records are reviewed and case discussed with Care  Management/Social Workerr. Management plans discussed with the patient, family and they are  in agreement.  CODE STATUS: DNR  TOTAL TIME TAKING CARE OF THIS PATIENT: 37 minutes.   POSSIBLE D/C IN 1-2 DAYS, DEPENDING ON CLINICAL CONDITION.   Enid BaasKALISETTI,Kaytelynn Scripter M.D on 02/07/2015 at 3:10 PM  Between 7am to 6pm - Pager - [phone]  After 6pm go to [email or web address] - password [email or web address] [email or web address]  [facility]  Office  [phone]  CC: Primary care physician; Mila Merryonald Fisher, MD

## 2015-02-07 NOTE — Progress Notes (Signed)
A&O. Up with one assist to [facility]. No complaints during the night. Does get dyspneic with exertion. ON O2 at 2L as at home. IV fluids infusing. ON IV antibiotics. Skin verified with Misty StanleyLisa.

## 2015-02-07 NOTE — Care Management Note (Signed)
Case Management Note  Patient Details  Name: Cynthia [name] MRN: [id number] Date of Birth: 21-Apr-1925  Subjective/Objective:                  Met with patient and one of her nieces. She does not have children but has two nieces that assist her when needed. Patient is chronically on O2 at home through [place] and has portable O2. She is open to [facility] but very "unhappy with schedules". She states she has told her PCP (whom arranged home health) and [facility] but no changes have been made. She states she lives alone but manages daily activities. She requests assistance with meals and house keeping. She does not drive- she depends on her nieces for transportation. Her PCP is Dr. Freddi Starr. She denies difficulty obtaining Rx.  Action/Plan: List of home health agencies left with patient in case she wishes to change agencies. Contact for [facility] provided to niece. List of private duty/private pay agencies provided also and explained that medicare will not pay for these services. They have applied for Meals on Wheels. RNCM will continue to follow.   Expected Discharge Date:                  Expected Discharge Plan:     In-House Referral:     Discharge planning Services  CM Consult  Post Acute Care Choice:  Home Health Choice offered to:  Patient  DME Arranged:    DME Agency:     HH Arranged:    [place] Agency:  [facility]  Status of Service:  In process, will continue to follow  Medicare Important Message Given:    Date Medicare IM Given:    Medicare IM give by:    Date Additional Medicare IM Given:    Additional Medicare Important Message give by:     If discussed at [place] of Stay Meetings, dates discussed:    Additional Comments:  Marshell Garfinkel, RN 02/07/2015, 11:25 AM

## 2015-02-07 NOTE — Consult Note (Addendum)
PULMONARY CONSULT NOTE  Requesting MD/Service: Nemiah CommanderKalisetti Date of consult: 12/25 Reason for consultation: COPD, acute on chronic respiratory failure  PT PROFILE: [age] F followed by Dr Welton FlakesKhan for severe, O2 dependent COPD. Class 3 dyspnea @ baseline   HPI:  Admitted 12/25 with one day history of fever (101.0) and increased cough/dyspnea. No CP, hemoptysis, LE edema  Past Medical History  Diagnosis Date  . Abdominal pain, epigastric   . Aneurysm of aorta (HCC)   . Osteoarthritis   . GERD (gastroesophageal reflux disease)   . Spinal stenosis of lumbar region without neurogenic claudication   . Insomnia   . Hypercholesteremia   . Osteoporosis   . Hypertension   . Hyperthyroidism   . Stomatitis monilial   . CAD (coronary artery disease)   . Renal failure   . UTI symptoms   . Stroke (HCC)   . Allergic drug reaction   . Breathlessness on exertion   . Asthma   . COPD (chronic obstructive pulmonary disease) (HCC)   . Seborrheic keratoses   . Olecranon bursitis   . History of partial colectomy   . Pneumonia   . Hypoxia   . Nocturia   . Vaginal atrophy     Past Surgical History  Procedure Laterality Date  . Multilevel spinal fusion and laminectomy  2010    Due to spinal stenosis Dr. Gerrit Heckaliff  . Rotator cuff surgery  2007  . Vaginal hysterectomy  1960  . Wrist surgery      lump removed due to pain  . Colon surgery      Hole in colon that was repaired  . Appendectomy      MEDICATIONS: I have reviewed all medications and confirmed regimen as documented  Social History   Social History  . Marital Status: Widowed    Spouse Name: N/A  . Number of Children: N/A  . Years of Education: N/A   Occupational History  . Not on file.   Social History Main Topics  . Smoking status: Former Games developermoker  . Smokeless tobacco: Not on file     Comment: quit 11 years ago  . Alcohol Use: 0.0 oz/week    0 Standard drinks or equivalent per week     Comment: 2 oz of wine at night  . Drug  Use: No  . Sexual Activity: Not on file   Other Topics Concern  . Not on file   Social History Narrative    History reviewed. No pertinent family history.  ROS: No myalgias/arthralgias, unexplained weight loss or weight gain No new focal weakness or sensory deficits No otalgia, hearing loss, visual changes, nasal and sinus symptoms, mouth and throat problems No neck pain or adenopathy No abdominal pain, N/V/D, diarrhea, change in bowel pattern No dysuria, change in urinary pattern No LE edema or calf tenderness   Filed Vitals:   02/07/15 2156 02/08/15 0036 02/08/15 0420 02/08/15 0859  BP:   115/66   Pulse:  102 86   Temp:   98.2 F (36.8 C)   TempSrc:   Oral   Resp:   26   Height:      Weight:   134 lb 9.6 oz (61.054 kg)   SpO2: 97% 97% 95% 96%     EXAM:  Gen: frail, elderly, No overt respiratory distress @ rest HEENT: NCAT, oropharynx normal Neck: Supple without LAN, thyromegaly, JVD Lungs: breath sounds moderately diminshed, hyperresonant to percussion, No wheezes, L basilar crackles Cardiovascular: Reg rhythm, rate normal, no murmurs  noted Abdomen: Soft, nontender, normal BS Ext: without clubbing, cyanosis, edema Neuro: CNs grossly intact, motor and sensory intact, DTRs symmetric Skin: Limited exam, no lesions noted  DATA:   BMP Latest Ref Rng 02/07/2015 02/06/2015 11/15/2014  Glucose 65 - 99 mg/dL - 161(W) 960(A)  BUN 6 - 20 mg/dL - 18 16  Creatinine 5.40 - 1.00 mg/dL 9.81 1.91 4.78(G)  BUN/Creat Ratio 11 - 26 - - 15  Sodium 135 - 145 mmol/L - 135 142  Potassium 3.5 - 5.1 mmol/L - 5.0 4.6  Chloride 101 - 111 mmol/L - 99(L) 98  CO2 22 - 32 mmol/L - 26 29  Calcium 8.9 - 10.3 mg/dL - 8.9 9.8    CBC Latest Ref Rng 02/07/2015 02/06/2015 11/15/2014  WBC 3.6 - 11.0 K/uL 13.7(H) 17.7(H) 8.6  Hemoglobin 12.0 - 16.0 g/dL 10.9(L) 12.4 -  Hematocrit 35.0 - 47.0 % 32.6(L) 36.9 40.9  Platelets 150 - 440 K/uL 165 191 -    CXR:  Chronic changes of COPD, vague L  basialr interstitial prominence    IMPRESSION: Dr Welton Flakes pt with severe baseline COPD, class 3 dyspnea on best day AECOPD - no wheezing presently Probable LLL PNA Chronic prednisone (10 mg/day)  PLAN/REC: Cont current abx for now Begin tapering steroids Cont bronchodilators - dosing schedule adjusted I will inform Dr Welton Flakes of her hospitalization and anticipate that he will assume pulmonary care duties 12/27  Billy Fischer, MD PCCM service Mobile [phone] Pager [phone]

## 2015-02-08 ENCOUNTER — Encounter: Payer: Self-pay | Admitting: *Deleted

## 2015-02-08 ENCOUNTER — Telehealth: Payer: Self-pay | Admitting: Family Medicine

## 2015-02-08 DIAGNOSIS — L0390 Cellulitis, unspecified: Secondary | ICD-10-CM | POA: Diagnosis present

## 2015-02-08 LAB — BLOOD GAS, ARTERIAL
ACID-BASE DEFICIT: 0.2 mmol/L (ref 0.0–2.0)
BICARBONATE: 24.8 meq/L (ref 21.0–28.0)
FIO2: 0.28
O2 Saturation: 96.2 %
PATIENT TEMPERATURE: 37
pCO2 arterial: 41 mmHg (ref 32.0–48.0)
pH, Arterial: 7.39 (ref 7.350–7.450)
pO2, Arterial: 84 mmHg (ref 83.0–108.0)

## 2015-02-08 MED ORDER — PREDNISONE 10 MG (21) PO TBPK: 10.0000 mg | ORAL_TABLET | Freq: Every day | ORAL | Status: DC

## 2015-02-08 MED ORDER — SODIUM CHLORIDE 0.9 % IJ SOLN: 3.0000 mL | INTRAMUSCULAR | Status: DC | PRN

## 2015-02-08 MED ORDER — AMOXICILLIN-POT CLAVULANATE 875-125 MG PO TABS: 1.0000 | ORAL_TABLET | Freq: Two times a day (BID) | ORAL | Status: AC

## 2015-02-08 MED ORDER — PREDNISONE 10 MG PO TABS: 10.0000 mg | ORAL_TABLET | Freq: Every day | ORAL | Status: DC

## 2015-02-08 NOTE — Progress Notes (Signed)
Discharge instructions given to patient. Will go over with niece with she arrives as well due to patient not having great vision. Prescriptions given to patient. Education on COPD given. Instructed to take new prednisone taper as well as antibiotics. Patient has no further questions. Niece is on the way now. IV and tele removed.

## 2015-02-08 NOTE — Care Management (Signed)
Patient discharged today to home.  [facility], but would like to switch nurse aid.  Resumption of care order placed for RN, PT, OT and aide.  [facility] notified of [facility]'s concerns and stated she would ensure that patient would be assigned a different aide.  Portable tank at bedside for discharge.  RNCM signing off.

## 2015-02-08 NOTE — Discharge Summary (Signed)
[facility] [place] [facility]   PATIENT NAME: Cynthia LimaDaisy [name]    MR#:  [id number]  DATE OF BIRTH:  1925/11/01  DATE OF ADMISSION:  02/06/2015 ADMITTING PHYSICIAN: Ramonita LabAruna Gouru, MD  DATE OF DISCHARGE: 02/08/15  PRIMARY CARE PHYSICIAN: Mila Merryonald Fisher, MD    ADMISSION DIAGNOSIS:  Hypoxia [R09.02] COPD exacerbation (HCC) [J44.1] Calf tenderness [M79.609] Fever, unspecified fever cause [R50.9]  DISCHARGE DIAGNOSIS:  Active Problems:   COPD with acute exacerbation (HCC)   Cellulitis   SECONDARY DIAGNOSIS:   Past Medical History  Diagnosis Date  . Abdominal pain, epigastric   . Aneurysm of aorta (HCC)   . Osteoarthritis   . GERD (gastroesophageal reflux disease)   . Spinal stenosis of lumbar region without neurogenic claudication   . Insomnia   . Hypercholesteremia   . Osteoporosis   . Hypertension   . Hyperthyroidism   . Stomatitis monilial   . CAD (coronary artery disease)   . Renal failure   . UTI symptoms   . Stroke (HCC)   . Allergic drug reaction   . Breathlessness on exertion   . Asthma   . COPD (chronic obstructive pulmonary disease) (HCC)   . Seborrheic keratoses   . Olecranon bursitis   . History of partial colectomy   . Pneumonia   . Hypoxia   . Nocturia   . Vaginal atrophy     HOSPITAL COURSE:   81y/o F with COPD on 2L home oxygen, chronic tachypnea and wheezing, GERD, HTN, CAD, CKD, Asthma, osteoporosis presents to the hospital secondary to worsening breathing and also left foot swelling and erythema.  #1 Acute on chronic hypoxic respiratory failure-secondary to COPD exacerbation. -Was on 4 L oxygen on admisison, weaned down to 2 L now which is patients home o2 level. -Continue steroids. Taper as outpatient. On chronic low dose prednisone- that can be started after finishing the taper. -Continue nebulizer treatments and inhalers - pulmonary follow up as outpatient  #2 Left foot cellulitis-improving with IV  antibiotics. -On vancomycin and Zosyn. Change to augmentin at discharge - keep the foot elevated, weight bearing as tolerated - blood cultures are negative so far -Doppler is negative for any DVT.  #3 Hypertension-continue home medications. On imdur  #4 Hypothyroidism- synthroid  #5 GERD- Protonix  Back to baseline, improving cellulitis. Discharge today.  DISCHARGE CONDITIONS:   Guarded  CONSULTS OBTAINED:  Treatment Team:  Yevonne PaxSaadat A Khan, MD Merwyn Katosavid B Simonds, MD  DRUG ALLERGIES:   Allergies  Allergen Reactions  . Nitrofurantoin Monohyd Macro Shortness Of Breath  . Levaquin [Levofloxacin] Rash  . Codeine Itching  . Crestor [Rosuvastatin] Other (See Comments)    Reaction:  Dizziness   . Duratuss G [Guaifenesin] Nausea And Vomiting  . Oxycodone-Acetaminophen Itching  . Propoxyphene Itching  . Tramadol Itching  . Morphine Itching and Rash    DISCHARGE MEDICATIONS:   Current Discharge Medication List    START taking these medications   Details  amoxicillin-clavulanate (AUGMENTIN) 875-125 MG tablet Take 1 tablet by mouth 2 (two) times daily. Qty: 15 tablet, Refills: 0    predniSONE (STERAPRED UNI-PAK 21 TAB) 10 MG (21) TBPK tablet Take 1 tablet (10 mg total) by mouth daily. 6 tabs PO x 1 day 5 tabs PO x 1 day 4 tabs PO x 1 day 3 tabs PO x 1 day 2 tabs PO x 1 day 1 tab PO x 1 day and stop Qty: 21 tablet, Refills: 0      CONTINUE  these medications which have CHANGED   Details  predniSONE (DELTASONE) 10 MG tablet Take 1 tablet (10 mg total) by mouth daily with breakfast. RESTART THIS AFTER FINISHING OFF THE PREDNISONE TAPER IN 12 DAYS Qty: 30 tablet, Refills: 0      CONTINUE these medications which have NOT CHANGED   Details  albuterol (PROVENTIL HFA;VENTOLIN HFA) 108 (90 BASE) MCG/ACT inhaler Inhale 2 puffs into the lungs every 6 (six) hours as needed for wheezing or shortness of breath.     albuterol (PROVENTIL) (2.5 MG/3ML) 0.083% nebulizer solution Take  2.5 mg by nebulization every 6 (six) hours as needed for wheezing or shortness of breath. Pt mixes with her Atrovent nebulizer solution.    aspirin EC 81 MG tablet Take 81 mg by mouth daily at 12 noon.     cholecalciferol (VITAMIN D) 1000 UNITS tablet Take 1,000 Units by mouth daily at 12 noon.    dextromethorphan-guaiFENesin (MUCINEX DM) 30-600 MG 12hr tablet Take 1 tablet by mouth 2 (two) times daily as needed for cough.    diclofenac sodium (VOLTAREN) 1 % GEL Apply 2 g topically 4 (four) times daily as needed (for pain).    ferrous sulfate 325 (65 FE) MG tablet Take 325 mg by mouth daily at 12 noon.    fexofenadine (ALLEGRA) 180 MG tablet Take 180 mg by mouth daily.    Fluticasone Furoate-Vilanterol (BREO ELLIPTA) 200-25 MCG/INH AEPB Inhale 1 puff into the lungs daily.    ibuprofen (ADVIL,MOTRIN) 200 MG tablet Take 600 mg by mouth every 6 (six) hours as needed for mild pain.    ipratropium (ATROVENT) 0.02 % nebulizer solution Take 0.5 mg by nebulization every 6 (six) hours as needed for wheezing or shortness of breath. Pt mixes with her albuterol nebulizer solution.    isosorbide mononitrate (IMDUR) 30 MG 24 hr tablet Take 30 mg by mouth daily at 12 noon.     lansoprazole (PREVACID) 30 MG capsule Take 30 mg by mouth daily.     levothyroxine (SYNTHROID, LEVOTHROID) 75 MCG tablet Take 75 mcg by mouth daily before breakfast.    Magnesium Oxide 500 MG TABS Take 500 mg by mouth daily at 12 noon.    multivitamin-lutein (OCUVITE-LUTEIN) CAPS capsule Take 1 capsule by mouth daily at 12 noon.    nitroGLYCERIN (NITROSTAT) 0.4 MG SL tablet Place 0.4 mg under the tongue every 5 (five) minutes as needed for chest pain.    Omega-3 Fatty Acids (FISH OIL) 1000 MG CAPS Take 1,000 mg by mouth daily at 12 noon.     POTASSIUM PO Take 1 tablet by mouth daily at 12 noon.    vitamin C (ASCORBIC ACID) 500 MG tablet Take 500 mg by mouth daily at 12 noon.          DISCHARGE INSTRUCTIONS:   1.  PCP f/u in 1-2 weeks 2. Pulmonology follow up in 2 weeks  If you experience worsening of your admission symptoms, develop shortness of breath, life threatening emergency, suicidal or homicidal thoughts you must seek medical attention immediately by calling 911 or calling your MD immediately  if symptoms less severe.  You Must read complete instructions/literature along with all the possible adverse reactions/side effects for all the Medicines you take and that have been prescribed to you. Take any new Medicines after you have completely understood and accept all the possible adverse reactions/side effects.   Please note  You were cared for by a hospitalist during your hospital stay. If you have any questions  about your discharge medications or the care you received while you were in the hospital after you are discharged, you can call the unit and asked to speak with the hospitalist on call if the hospitalist that took care of you is not available. Once you are discharged, your primary care physician will handle any further medical issues. Please note that NO REFILLS for any discharge medications will be authorized once you are discharged, as it is imperative that you return to your primary care physician (or establish a relationship with a primary care physician if you do not have one) for your aftercare needs so that they can reassess your need for medications and monitor your lab values.    Today   CHIEF COMPLAINT:   Chief Complaint  Patient presents with  . Shortness of Breath    VITAL SIGNS:  Blood pressure 115/66, pulse 86, temperature 98.2 F (36.8 C), temperature source Oral, resp. rate 26, height  (1.549 m), weight 61.054 kg (134 lb 9.6 oz), SpO2 96 %.  I/O:   Intake/Output Summary (Last 24 hours) at 02/08/15 1101 Last data filed at 02/08/15 1034  Gross per 24 hour  Intake    773 ml  Output    950 ml  Net   -177 ml    PHYSICAL EXAMINATION:   Physical  Exam  GENERAL: 81 y.o.-year-old patient lying in the bed with no acute distress. Appears tachypneic but according to her niece that is her baseline. EYES: Pupils equal, round, reactive to light and accommodation. No scleral icterus. Extraocular muscles intact.  HEENT: Head atraumatic, normocephalic. Oropharynx and nasopharynx clear.  NECK: Supple, no jugular venous distention. No thyroid enlargement, no tenderness.  LUNGS: Appears tachypneic at baseline. Scant breath sounds at the bases with scattered wheezing. No rales, rhonchi or crackles.Not using accessory muscles to breathe. Able to finish sentences in full. CARDIAC: S1, S2 normal. No rubs, or gallops. 3/6 systolic murmur at the bases ABDOMEN: Soft, nontender, nondistended. Bowel sounds present. No organomegaly or mass.  EXTREMITIES: No pedal edema, cyanosis, or clubbing. Left foot the lateral 3 toes are minimally swollen, nontender and erythematous with some dorsal erythema- better than admission. NEUROLOGIC: Cranial nerves II through XII are intact. Muscle strength 5/5 in all extremities. Sensation intact. Gait not checked.  PSYCHIATRIC: The patient is alert and oriented x 3.  SKIN: No obvious rash, lesion, or ulcer.   DATA REVIEW:   CBC  Recent Labs Lab 02/07/15 0030  WBC 13.7*  HGB 10.9*  HCT 32.6*  PLT 165    Chemistries   Recent Labs Lab 02/06/15 1743 02/07/15 0030  NA 135  --   K 5.0  --   CL 99*  --   CO2 26  --   GLUCOSE 148*  --   BUN 18  --   CREATININE 0.99 0.97  CALCIUM 8.9  --   AST 28  --   ALT 15  --   ALKPHOS 55  --   BILITOT 1.6*  --     Cardiac Enzymes  Recent Labs Lab 02/07/15 1216  TROPONINI 0.03    Microbiology Results  Results for orders placed or performed during the hospital encounter of 02/06/15  Culture, blood (routine x 2)     Status: None (Preliminary result)   Collection Time: 02/06/15  5:43 PM  Result Value Ref Range Status   Specimen Description BLOOD LEFT [facility]   Final   Special Requests BOTTLES DRAWN AEROBIC AND ANAEROBIC  3CC  Final   Culture NO GROWTH < 24 HOURS  Final   Report Status PENDING  Incomplete  Culture, blood (routine x 2)     Status: None (Preliminary result)   Collection Time: 02/06/15  6:00 PM  Result Value Ref Range Status   Specimen Description BLOOD LEFT HAND  Final   Special Requests BOTTLES DRAWN AEROBIC AND ANAEROBIC  1CC  Final   Culture NO GROWTH < 24 HOURS  Final   Report Status PENDING  Incomplete  Urine culture     Status: None (Preliminary result)   Collection Time: 02/07/15  2:39 PM  Result Value Ref Range Status   Specimen Description URINE, CLEAN CATCH  Final   Special Requests NONE  Final   Culture NO GROWTH < 24 HOURS  Final   Report Status PENDING  Incomplete    RADIOLOGY:  Dg Chest 2 View  02/06/2015  CLINICAL DATA:  81 year old female with COPD presenting with shortness of breath. EXAM: CHEST  2 VIEW COMPARISON:  Radiograph dated 12/29/2014 FINDINGS: Two views of the chest demonstrate emphysematous changes of the lungs. No focal consolidation, pleural effusion, or pneumothorax. The cardiac silhouette is within normal limits. There is osteopenia with degenerative changes of the spine. IMPRESSION: No active cardiopulmonary disease. Electronically Signed   By: Elgie Collard M.D.   On: 02/06/2015 18:34   US Venous Img Lower Bilateral  02/06/2015  CLINICAL DATA:  Subacute onset of bilateral calf tenderness and shortness of breath. Initial encounter. EXAM: BILATERAL LOWER EXTREMITY VENOUS DOPPLER ULTRASOUND TECHNIQUE: Gray-scale sonography with graded compression, as well as color Doppler and duplex ultrasound were performed to evaluate the lower extremity deep venous systems from the level of the common femoral vein and including the common femoral, femoral, profunda femoral, popliteal and calf veins including the posterior tibial, peroneal and gastrocnemius veins when visible. The superficial great saphenous vein  was also interrogated. Spectral Doppler was utilized to evaluate flow at rest and with distal augmentation maneuvers in the common femoral, femoral and popliteal veins. COMPARISON:  None. FINDINGS: RIGHT LOWER EXTREMITY Common Femoral Vein: No evidence of thrombus. Normal compressibility, respiratory phasicity and response to augmentation. Saphenofemoral Junction: No evidence of thrombus. Normal compressibility and flow on color Doppler imaging. Profunda Femoral Vein: No evidence of thrombus. Normal compressibility and flow on color Doppler imaging. Femoral Vein: No evidence of thrombus. Normal compressibility, respiratory phasicity and response to augmentation. Popliteal Vein: No evidence of thrombus. Normal compressibility, respiratory phasicity and response to augmentation. Calf Veins: No evidence of thrombus. Normal compressibility and flow on color Doppler imaging. Superficial Great Saphenous Vein: No evidence of thrombus. Normal compressibility and flow on color Doppler imaging. Venous Reflux:  None. Other Findings:  None. LEFT LOWER EXTREMITY Common Femoral Vein: No evidence of thrombus. Normal compressibility, respiratory phasicity and response to augmentation. Saphenofemoral Junction: No evidence of thrombus. Normal compressibility and flow on color Doppler imaging. Profunda Femoral Vein: No evidence of thrombus. Normal compressibility and flow on color Doppler imaging. Femoral Vein: No evidence of thrombus. Normal compressibility, respiratory phasicity and response to augmentation. Popliteal Vein: No evidence of thrombus. Normal compressibility, respiratory phasicity and response to augmentation. Calf Veins: No evidence of thrombus. Normal compressibility and flow on color Doppler imaging. Superficial Great Saphenous Vein: No evidence of thrombus. Normal compressibility and flow on color Doppler imaging. Venous Reflux:  None. Other Findings:  None. IMPRESSION: No evidence of deep venous thrombosis.  Electronically Signed   By: Roanna Raider M.D.   On: 02/06/2015 23:30  EKG:   Orders placed or performed during the hospital encounter of 02/06/15  . EKG 12-Lead  . EKG 12-Lead      Management plans discussed with the patient, family and they are in agreement.  CODE STATUS:     Code Status Orders        Start     Ordered   02/07/15 0012  Do not attempt resuscitation (DNR)   Continuous    Question Answer Comment  In the event of cardiac or respiratory ARREST Do not call a "code blue"   In the event of cardiac or respiratory ARREST Do not perform Intubation, CPR, defibrillation or ACLS   In the event of cardiac or respiratory ARREST Use medication by any route, position, wound care, and other measures to relive pain and suffering. May use oxygen, suction and manual treatment of airway obstruction as needed for comfort.   Comments Patient is refusing BiPAP, RN may pronounce      02/07/15 0011    Advance Directive Documentation        Most Recent Value   Type of Advance Directive  Living will   Pre-existing out of facility DNR order (yellow form or pink MOST form)     "MOST" Form in Place?        TOTAL TIME TAKING CARE OF THIS PATIENT: 37 minutes.    Enid Baas M.D on 02/08/2015 at 11:01 AM  Between 7am to 6pm - Pager - [phone]  After 6pm go to [email or web address] - password [email or web address] [facility]   [facility]  Office  [phone]  CC: Primary care physician; Mila Merry, MD

## 2015-02-08 NOTE — Care Management Important Message (Signed)
Important Message  Patient Details  Name: Cynthia PughDaisy W [name] MRN: [id number] Date of Birth: [birth year]   Medicare Important Message Given:  Yes    Collie SiadAngela Hailee Hollick, RN 02/08/2015, 8:01 AM

## 2015-02-08 NOTE — Telephone Encounter (Signed)
[facility].  I have scheduled a hospital follow up appointment/MW

## 2015-02-08 NOTE — Consult Note (Signed)
   [facility] CM Inpatient Consult   02/08/2015  Cynthia [name] [birth year] [id number]   Met with the patient at bedside to offer [place] [facility] services as benefit of [facility] [employer] insurance. Patient agreeable to services, and will receive post hospital discharge call and will be evaluated for  home visits, consent signed. [facility] information packet provided to patient. Patient confirmed her best contact number is [phone] and her primary care provider is Dr.Dennis Chrismon.   Of note, [facility] Care Management services does not replace or interfere with any services that are arranged by inpatient case management or social work. For additional questions or referrals please contact:  Joylene Draft, RN, [place] [facility]/Hospital Liaison [phone]- Mobile [phone]- [place]

## 2015-02-08 NOTE — Progress Notes (Signed)
Discharge packet reviewed with niece and patient. No complaints or concerns. Discharged home, riding with niece.

## 2015-02-08 NOTE — Care Management (Signed)
Plan for patient to discharge today.  Patient is currently [facility] with [facility] health [facility], PT, OT, and aide.  Patient expresses some concerns with the care provided by the aide.  I offered the patient the option to switch agencies.  She would like me to discuss this with her niece.  Message left for Jola BabinskiCindy Cates (Niece)  Awaiting return call.   Spoke with Brittney from [facility] who stated that it is an option for the patient to keep the services that she is satisfied with, and switch out the aid if she would like to.

## 2015-02-09 LAB — URINE CULTURE: CULTURE: NO GROWTH

## 2015-02-10 ENCOUNTER — Other Ambulatory Visit: Payer: Self-pay | Admitting: *Deleted

## 2015-02-10 NOTE — Patient Outreach (Signed)
RNCM called pt as part of transition of care program. [facility] and consent received verbally from pt. Transition of care flow-sheet completed. RNCM made an appointment to make a home visit with pt.   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 She stated she had been coughing up a lot of mucus since she had been home and it was thick. She stated she was using her incentive spirometer for "breathing exercises". Pt stated her SOB has been a little worse since coning home and she has had to use her rescue medication during the night which is unusual for her. She has been using nebulizer 4-5 times per day.  Pt stating she cannot see her leg but her niece told her it was still purple and cold. Pt stated it was like her toes were frozen together. Pt denies pain to leg and denies fever. Pt stating leg had actually improved since starting antibiotics and steroids. Pt stating [facility] home health was coming out tomorrow to start services. RNCM requested pt call her if [facility] did not come as planned. Pt has inside pets, denies smoking. Pt cheerful and pleasant with a noted positive attitude towards condition. Pt stating she knows who to call for emergency and has a plan for COPD flare ups. Pt with follow appts already made and transportation to appts.   Plan: RNCM will see pt next week as part of the transition of care program.   [name] Smayan Hackbart RN, BSN  [facility] Care Management [phone])

## 2015-02-12 LAB — CULTURE, BLOOD (ROUTINE X 2)
CULTURE: NO GROWTH
CULTURE: NO GROWTH

## 2015-02-14 DIAGNOSIS — Z7982 Long term (current) use of aspirin: Secondary | ICD-10-CM | POA: Diagnosis not present

## 2015-02-14 DIAGNOSIS — J441 Chronic obstructive pulmonary disease with (acute) exacerbation: Secondary | ICD-10-CM | POA: Diagnosis not present

## 2015-02-14 DIAGNOSIS — E039 Hypothyroidism, unspecified: Secondary | ICD-10-CM | POA: Diagnosis not present

## 2015-02-14 DIAGNOSIS — I10 Essential (primary) hypertension: Secondary | ICD-10-CM | POA: Diagnosis not present

## 2015-02-14 DIAGNOSIS — Z7952 Long term (current) use of systemic steroids: Secondary | ICD-10-CM | POA: Diagnosis not present

## 2015-02-14 DIAGNOSIS — J45909 Unspecified asthma, uncomplicated: Secondary | ICD-10-CM | POA: Diagnosis not present

## 2015-02-14 DIAGNOSIS — Z8701 Personal history of pneumonia (recurrent): Secondary | ICD-10-CM | POA: Diagnosis not present

## 2015-02-14 DIAGNOSIS — J9611 Chronic respiratory failure with hypoxia: Secondary | ICD-10-CM | POA: Diagnosis not present

## 2015-02-14 DIAGNOSIS — M1990 Unspecified osteoarthritis, unspecified site: Secondary | ICD-10-CM | POA: Diagnosis not present

## 2015-02-15 ENCOUNTER — Telehealth: Payer: Self-pay | Admitting: Family Medicine

## 2015-02-15 ENCOUNTER — Inpatient Hospital Stay: Payer: Commercial Managed Care - HMO | Admitting: Family Medicine

## 2015-02-15 ENCOUNTER — Encounter: Payer: Self-pay | Admitting: Family Medicine

## 2015-02-15 ENCOUNTER — Ambulatory Visit (INDEPENDENT_AMBULATORY_CARE_PROVIDER_SITE_OTHER): Payer: Medicare HMO | Admitting: Family Medicine

## 2015-02-15 VITALS — BP 142/82 | HR 98 | Temp 98.4°F | Resp 20 | Wt 141.0 lb

## 2015-02-15 DIAGNOSIS — R0902 Hypoxemia: Secondary | ICD-10-CM | POA: Diagnosis not present

## 2015-02-15 DIAGNOSIS — I739 Peripheral vascular disease, unspecified: Secondary | ICD-10-CM

## 2015-02-15 DIAGNOSIS — J441 Chronic obstructive pulmonary disease with (acute) exacerbation: Secondary | ICD-10-CM | POA: Diagnosis not present

## 2015-02-15 MED ORDER — ALBUTEROL SULFATE HFA 108 (90 BASE) MCG/ACT IN AERS: 2.0000 | INHALATION_SPRAY | Freq: Four times a day (QID) | RESPIRATORY_TRACT | Status: DC | PRN

## 2015-02-15 NOTE — Telephone Encounter (Signed)
FYI--Appointment with [facility] 02/17/15.Is this soon enough ?

## 2015-02-15 NOTE — Progress Notes (Signed)
Patient: Cynthia [name] Female    DOB: 1925-12-31   82 y.o.   MRN: [id number] Visit Date: 02/15/2015  Today's Provider: Dortha Kern, PA   Chief Complaint  Patient presents with  . Hospitalization Follow-up   Subjective:    HPI  Follow up Hospitalization  Patient was admitted to [facility] on 02/06/2015 and discharged on 12/272016. She was treated for COPD exacerbation with suspected infection in the left foot. Cultures of blood were negative and no DVT. Treatment for this included antibiotic and steroid. She reports good compliance with treatment. She reports this condition is Unchanged. States she is breathing a little better than prior to hospitalization. Having to stay on oxygen 24 hours a day now at 2 1/2 LPM. Still having the left foot feeling very cold and painful to touch. Right foot is warm.  Patient Active Problem List   Diagnosis Date Noted  . Cellulitis 02/08/2015  . COPD with acute exacerbation (HCC) 02/06/2015  . Atherosclerosis of coronary artery 11/11/2014  . Centriacinar emphysema (HCC) 11/11/2014  . CAFL (chronic airflow limitation) (HCC) 11/11/2014  . Polypharmacy 11/11/2014  . H/O hemicolectomy 11/11/2014  . Adult hypothyroidism 11/11/2014  . Hypoxia 11/11/2014  . Bursitis of elbow 11/11/2014  . Degenerative arthritis of hip 11/11/2014  . Basal cell papilloma 11/11/2014  . H/O respiratory system disease 07/29/2014  . Inflammation of sacroiliac joint (HCC) 09/29/2013  . AA (aortic aneurysm) (HCC) 07/19/2009  . Arthritis of hand, degenerative 11/12/2008  . Acid reflux 11/27/2007  . Lumbar canal stenosis 09/16/2007  . Hypercholesteremia 01/31/2007  . BP (high blood pressure) 11/20/2005  . OP (osteoporosis) 11/20/2005   Past Surgical History  Procedure Laterality Date  . Multilevel spinal fusion and laminectomy  2010    Due to spinal stenosis Dr. Gerrit Heck  . Rotator cuff surgery  2007  . Vaginal hysterectomy  1960  . Wrist surgery      lump removed due to  pain  . Colon surgery      Hole in colon that was repaired  . Appendectomy     History reviewed. No pertinent family history. Allergies  Allergen Reactions  . Nitrofurantoin Monohyd Macro Shortness Of Breath  . Levaquin [Levofloxacin] Rash  . Codeine Itching  . Crestor [Rosuvastatin] Other (See Comments)    Reaction:  Dizziness   . Duratuss G [Guaifenesin] Nausea And Vomiting  . Oxycodone-Acetaminophen Itching  . Propoxyphene Itching  . Tramadol Itching  . Morphine Itching and Rash     Previous Medications   ALBUTEROL (PROVENTIL HFA;VENTOLIN HFA) 108 (90 BASE) MCG/ACT INHALER    Inhale 2 puffs into the lungs every 6 (six) hours as needed for wheezing or shortness of breath.    ALBUTEROL (PROVENTIL) (2.5 MG/3ML) 0.083% NEBULIZER SOLUTION    Take 2.5 mg by nebulization every 6 (six) hours as needed for wheezing or shortness of breath. Pt mixes with her Atrovent nebulizer solution.   AMOXICILLIN-CLAVULANATE (AUGMENTIN) 875-125 MG TABLET    Take 1 tablet by mouth 2 (two) times daily.   ASPIRIN EC 81 MG TABLET    Take 81 mg by mouth daily at 12 noon.    CHOLECALCIFEROL (VITAMIN D) 1000 UNITS TABLET    Take 1,000 Units by mouth daily at 12 noon.   DEXTROMETHORPHAN-GUAIFENESIN (MUCINEX DM) 30-600 MG 12HR TABLET    Take 1 tablet by mouth 2 (two) times daily as needed for cough.   DICLOFENAC SODIUM (VOLTAREN) 1 % GEL    Apply 2 g topically 4 (four)  times daily as needed (for pain).   FERROUS SULFATE 325 (65 FE) MG TABLET    Take 325 mg by mouth daily at 12 noon.   FEXOFENADINE (ALLEGRA) 180 MG TABLET    Take 180 mg by mouth daily.   FLUTICASONE FUROATE-VILANTEROL (BREO ELLIPTA) 200-25 MCG/INH AEPB    Inhale 1 puff into the lungs daily.   IBUPROFEN (ADVIL,MOTRIN) 200 MG TABLET    Take 600 mg by mouth every 6 (six) hours as needed for mild pain.   IPRATROPIUM (ATROVENT) 0.02 % NEBULIZER SOLUTION    Take 0.5 mg by nebulization every 6 (six) hours as needed for wheezing or shortness of breath. Pt  mixes with her albuterol nebulizer solution.   ISOSORBIDE MONONITRATE (IMDUR) 30 MG 24 HR TABLET    Take 30 mg by mouth daily at 12 noon.    LANSOPRAZOLE (PREVACID) 30 MG CAPSULE    Take 30 mg by mouth daily.    LEVOTHYROXINE (SYNTHROID, LEVOTHROID) 75 MCG TABLET    Take 75 mcg by mouth daily before breakfast.   MAGNESIUM OXIDE 500 MG TABS    Take 500 mg by mouth daily at 12 noon.   MULTIVITAMIN-LUTEIN (OCUVITE-LUTEIN) CAPS CAPSULE    Take 1 capsule by mouth daily at 12 noon.   NITROGLYCERIN (NITROSTAT) 0.4 MG SL TABLET    Place 0.4 mg under the tongue every 5 (five) minutes as needed for chest pain.   OMEGA-3 FATTY ACIDS (FISH OIL) 1000 MG CAPS    Take 1,000 mg by mouth daily at 12 noon.    POTASSIUM PO    Take 1 tablet by mouth daily at 12 noon.   PREDNISONE (DELTASONE) 10 MG TABLET    Take 1 tablet (10 mg total) by mouth daily with breakfast. RESTART THIS AFTER FINISHING OFF THE PREDNISONE TAPER IN 12 DAYS   VITAMIN C (ASCORBIC ACID) 500 MG TABLET    Take 500 mg by mouth daily at 12 noon.     Review of Systems  Constitutional: Negative.   HENT: Negative.   Eyes: Negative.   Respiratory: Positive for shortness of breath.   Cardiovascular: Positive for leg swelling.  Gastrointestinal: Negative.   Endocrine: Negative.   Genitourinary: Negative.   Musculoskeletal: Negative.   Skin: Negative.   Allergic/Immunologic: Negative.   Hematological: Negative.   Psychiatric/Behavioral: Negative.     Social History  Substance Use Topics  . Smoking status: Former Games developermoker  . Smokeless tobacco: Not on file     Comment: quit 11 years ago  . Alcohol Use: 0.0 oz/week    0 Standard drinks or equivalent per week     Comment: 2 oz of wine at night   Objective:   BP 142/82 mmHg  Pulse 98  Temp(Src) 98.4 F (36.9 C) (Oral)  Resp 20  Wt 141 lb (63.957 kg)  SpO2 88%  Physical Exam  Constitutional: She is oriented to person, place, and time. She appears well-developed and well-nourished. No  distress.  HENT:  Head: Normocephalic and atraumatic.  Right Ear: Hearing normal.  Left Ear: Hearing normal.  Nose: Nose normal.  Eyes: Conjunctivae and lids are normal. Right eye exhibits no discharge. Left eye exhibits no discharge. No scleral icterus.  Pulmonary/Chest: She is in respiratory distress.  Very distant breath sounds.  Abdominal: Soft. Bowel sounds are normal.  Musculoskeletal:  Left foot bluish pink and very cold to touch. Very sensitive to touch from ankle to toes. Unable to feel pulses in left foot. Skin wrinkled and  no significant swelling.  Neurological: She is alert and oriented to person, place, and time.  Skin: Skin is intact. No lesion and no rash noted.  Psychiatric: Her speech is normal and behavior is normal. Thought content normal. Her mood appears anxious.      Assessment & Plan:     1. COPD with acute exacerbation [facility]) Hospitalized for acute exacerbation of severe COPD on 02-06-15 through 02-08-15. Has finished the prednisone taper and antibiotic. Breathing is better. Continues to use oxygen at 2LPM 24 hours a day with Proventil prn rescue, Breo, Mucinex-DM prn and Atrovent with Albuterol by nebulizer at home. Completed form for [employer] to prioritize her power supply due to severe COPD and to maintain operation of there oxygen concentrator. Proceed with pulmonology follow up with Dr. Welton Flakes as planned.  - albuterol (PROVENTIL HFA;VENTOLIN HFA) 108 (90 Base) MCG/ACT inhaler; Inhale 2 puffs into the lungs every 6 (six) hours as needed for wheezing or shortness of breath.  Dispense: 3 Inhaler; Refill: 4  2. Hypoxia Continues to need oxygen at least 2LPM 24 hours a day. Pulse oximetry 88% on oxygen by nasal cannula today.   3. PAD (peripheral artery disease) (HCC) Initially during hospitalization left foot was swollen. Continues to have pain and tenderness in feet (L>R) and left foot bluish pink, wrinkled and very cold to touch. Too painful to palpate pulse.  Right foot not cold or painful. Suspect PAD versus arterial occlusive disease. Schedule vascular surgeon referral as soon as possible. Elevation of foot causes more discomfort today. Doppler studies during hospitalization were negative for DVT. - Ambulatory referral to Vascular Surgery        ------------------------------------------------------------------------------------

## 2015-02-16 DIAGNOSIS — J441 Chronic obstructive pulmonary disease with (acute) exacerbation: Secondary | ICD-10-CM | POA: Diagnosis not present

## 2015-02-16 DIAGNOSIS — Z7982 Long term (current) use of aspirin: Secondary | ICD-10-CM | POA: Diagnosis not present

## 2015-02-16 DIAGNOSIS — J45909 Unspecified asthma, uncomplicated: Secondary | ICD-10-CM | POA: Diagnosis not present

## 2015-02-16 DIAGNOSIS — E039 Hypothyroidism, unspecified: Secondary | ICD-10-CM | POA: Diagnosis not present

## 2015-02-16 DIAGNOSIS — I10 Essential (primary) hypertension: Secondary | ICD-10-CM | POA: Diagnosis not present

## 2015-02-16 DIAGNOSIS — J9611 Chronic respiratory failure with hypoxia: Secondary | ICD-10-CM | POA: Diagnosis not present

## 2015-02-16 DIAGNOSIS — Z7952 Long term (current) use of systemic steroids: Secondary | ICD-10-CM | POA: Diagnosis not present

## 2015-02-16 DIAGNOSIS — Z8701 Personal history of pneumonia (recurrent): Secondary | ICD-10-CM | POA: Diagnosis not present

## 2015-02-16 DIAGNOSIS — M1990 Unspecified osteoarthritis, unspecified site: Secondary | ICD-10-CM | POA: Diagnosis not present

## 2015-02-17 DIAGNOSIS — J449 Chronic obstructive pulmonary disease, unspecified: Secondary | ICD-10-CM | POA: Diagnosis not present

## 2015-02-17 DIAGNOSIS — M7989 Other specified soft tissue disorders: Secondary | ICD-10-CM | POA: Diagnosis not present

## 2015-02-17 DIAGNOSIS — M79609 Pain in unspecified limb: Secondary | ICD-10-CM | POA: Diagnosis not present

## 2015-02-18 ENCOUNTER — Other Ambulatory Visit: Payer: Self-pay | Admitting: *Deleted

## 2015-02-18 ENCOUNTER — Encounter: Payer: Self-pay | Admitting: *Deleted

## 2015-02-18 ENCOUNTER — Telehealth: Payer: Self-pay | Admitting: Family Medicine

## 2015-02-18 MED ORDER — KETOROLAC TROMETHAMINE 10 MG PO TABS: 10.0000 mg | ORAL_TABLET | Freq: Four times a day (QID) | ORAL | Status: DC | PRN

## 2015-02-18 NOTE — Telephone Encounter (Signed)
[facility] Patient is aware.

## 2015-02-18 NOTE — Patient Outreach (Signed)
[facility]) Care Management   02/18/2015  JANICIA MONTERROSA [birth year] [id number]  BENITA BOONSTRA is an 82 y.o. female  Subjective: " I just want to be out of pain." "My nieces care for me and I have meals on wheels." "Home health is coming in to assist me too."  Objective: Blood pressure 138/78, pulse 84, resp. rate 24, height 1.575 m (_0 ), weight 128 lb (58.06 kg), SpO2 98 %.(using 2lpm of 02).   Review of Systems  Eyes:       Pt has history of limited vision.  Respiratory: Positive for shortness of breath.     Physical Exam  Constitutional: She is oriented to person, place, and time. She appears well-developed and well-nourished.  Cardiovascular: Normal rate and regular rhythm.   Respiratory: Effort normal and breath sounds normal.  SOB noted with increased pain Coarse breath sounds  GI: Soft. Bowel sounds are normal.  Musculoskeletal: She exhibits tenderness.       Right lower leg: She exhibits tenderness.       Left lower leg: She exhibits tenderness.  Pt c/o severe pain to left lower leg.   Neurological: She is alert and oriented to person, place, and time.  Skin: Skin is warm and dry.     Psychiatric: She has a normal mood and affect. Her speech is normal and behavior is normal. Judgment and thought content normal. Cognition and memory are normal.    Current Medications:   Current Outpatient Prescriptions  Medication Sig Dispense Refill  . albuterol (PROVENTIL HFA;VENTOLIN HFA) 108 (90 Base) MCG/ACT inhaler Inhale 2 puffs into the lungs every 6 (six) hours as needed for wheezing or shortness of breath. 3 Inhaler 4  . albuterol (PROVENTIL) (2.5 MG/3ML) 0.083% nebulizer solution Take 2.5 mg by nebulization every 6 (six) hours as needed for wheezing or shortness of breath. Pt mixes with her Atrovent nebulizer solution.    Marland Kitchen aspirin EC 81 MG tablet Take 81 mg by mouth daily at 12 noon.     . cholecalciferol (VITAMIN D) 1000 UNITS tablet Take 1,000 Units by  mouth daily at 12 noon.    Marland Kitchen dextromethorphan-guaiFENesin (MUCINEX DM) 30-600 MG 12hr tablet Take 1 tablet by mouth 2 (two) times daily as needed for cough.    . diclofenac sodium (VOLTAREN) 1 % GEL Apply 2 g topically 4 (four) times daily as needed (for pain).    . ferrous sulfate 325 (65 FE) MG tablet Take 325 mg by mouth daily at 12 noon.    . fexofenadine (ALLEGRA) 180 MG tablet Take 180 mg by mouth daily.    . Fluticasone Furoate-Vilanterol (BREO ELLIPTA) 200-25 MCG/INH AEPB Inhale 1 puff into the lungs daily.    Marland Kitchen ibuprofen (ADVIL,MOTRIN) 200 MG tablet Take 600 mg by mouth every 6 (six) hours as needed for mild pain.    Marland Kitchen ipratropium (ATROVENT) 0.02 % nebulizer solution Take 0.5 mg by nebulization every 6 (six) hours as needed for wheezing or shortness of breath. Pt mixes with her albuterol nebulizer solution.    . isosorbide mononitrate (IMDUR) 30 MG 24 hr tablet Take 30 mg by mouth daily at 12 noon.     . lansoprazole (PREVACID) 30 MG capsule Take 30 mg by mouth daily.     Marland Kitchen levothyroxine (SYNTHROID, LEVOTHROID) 75 MCG tablet Take 75 mcg by mouth daily before breakfast.    . Magnesium Oxide 500 MG TABS Take 500 mg by mouth daily at 12 noon.    Marland Kitchen  multivitamin-lutein (OCUVITE-LUTEIN) CAPS capsule Take 1 capsule by mouth daily at 12 noon.    . nitroGLYCERIN (NITROSTAT) 0.4 MG SL tablet Place 0.4 mg under the tongue every 5 (five) minutes as needed for chest pain.    . Omega-3 Fatty Acids (FISH OIL) 1000 MG CAPS Take 1,000 mg by mouth daily at 12 noon.     Marland Kitchen POTASSIUM PO Take 1 tablet by mouth daily at 12 noon.    . predniSONE (DELTASONE) 10 MG tablet Take 1 tablet (10 mg total) by mouth daily with breakfast. RESTART THIS AFTER FINISHING OFF THE PREDNISONE TAPER IN 12 DAYS 30 tablet 0  . vitamin C (ASCORBIC ACID) 500 MG tablet Take 500 mg by mouth daily at 12 noon.      No current facility-administered medications for this visit.    Functional Status:   In your present state of health, do  you have any difficulty performing the following activities: 02/18/2015 02/10/2015  Hearing? N N  Vision? Y Y  Difficulty concentrating or making decisions? N N  Walking or climbing stairs? Y Y  Dressing or bathing? Y Y  Doing errands, shopping? Tempie Donning  Preparing Food and eating ? - Y  Using the Toilet? - Y  In the past six months, have you accidently leaked urine? - Y  Do you have problems with loss of bowel control? - N  Managing your Medications? - Y  Managing your Finances? - Y  Housekeeping or managing your Housekeeping? - Y    Fall/Depression Screening:    PHQ 2/9 Scores 02/18/2015 02/10/2015 01/20/2015  PHQ - 2 Score 0 0 0   Fall Risk  02/18/2015 02/10/2015  Falls in the past year? No No  Risk for fall due to : Impaired mobility;Impaired vision Impaired vision    Assessment:  Pt surroundings noted to be in good repair. Pt reports limited vision related to macular degeneration. Pt can not drive but can use her walker to get around in her home. Pt has  2 dogs, scatter rugs, and long 02 tubing as potential fall hazards. RNCM mentioned this to pt, but pt was mainly concerned about her pain. Pt was unable to sit still and noted to be SOB related to pain. Pt refused for RNCM to visualize feet related to pain, lower legs cool to touch, no redness or discoloration noted above sockline.   RNCM called MD and spoke with Dr. Tedd Sias CMA, Roderic Ovens to report pt's severe pain. Pt had followed up with the vein and vascular specialist but was not ordered anything for pain. Pt was scheduled by the vein and vascualr to have a procedure to restore circulation to lower legs and relieve pain, but this was not scheduled for another two weeks. Pt is also on the cancellation list. CMA planned to get the message to the MD asap.   COPD: Pt willing to continue using the incentive spirometer. Lungs with course breath sounds throughout but no wheezes or crackles noted at this time. Continues on 02 at 2lpm with n/c  via concentrator. Pt familiar with her action plan for an exacerbation. Pt was able to demonstrate purse lipped breathing technique.   Discussed with pt the option of receiving services from "services for the blind" but pt did not feel this was necessary at this time.   RNCM noted in chart a pain medication was called in for pt and niece planned to pick it up.    Plan: RNCM will call pt next week as  continued part of the transition of care program.  Ramelo Oetken RN, BSN  [facility] Care Management [phone])  [facility] CM Care Plan Problem One        Most Recent Value   Care Plan Problem One  Pt would like to increase her strangth and avoid going back to the hospital   Role Documenting the Problem One  Care Management [place] for Problem One  Active   [facility]N Long Term Goal (31-90 days)  Pt will not be admitted to the hospital for the next 31 days    [facility]N Long Term Goal Start Date  02/10/15   Interventions for Problem One Long Term Goal  Transition of care program started. RNCM home visit scheduled. RNCM sent pt packet with 24hour nurse line.    THN CM Short Term Goal #1 (0-30 days)  Pt will attend primary care MD appointment in the next 7 days as scheduled.    THN CM Short Term Goal #1 Start Date  02/10/15   [facility] CM Short Term Goal #1 Met Date  02/18/15   Interventions for Short Term Goal #1  RNCM educated pt on the importance of f/u visit. RNCM ensured pt had transportation and ewas aware of appt date and time.   [facility] CM Short Term Goal #2 (0-30 days)  Pt will be seen by [facility] agency in the next 7 days.   [facility]N CM Short Term Goal #2 Start Date  02/10/15   [facility] CM Short Term Goal #2 Met Date  02/18/15   Interventions for Short Term Goal #2  RNCM ensured pt had been contacted by [facility], RNCM requested pt call her back if [facility] agency did not come as planned.    THN CM Short Term Goal #3 (0-30 days)  Pt will obtain pain medication for lower leg pain in the next 7 days   THN CM Short Term Goal #3 Start  Date  02/18/15   Interventions for Short Tern Goal #3  RNCM placed a cll to pt's primary MD to report's pt severe leg pain.    THN CM Short Term Goal #4 (0-30 days)  Pt will use incentive spirometer 2 times per day for the next 30 days.   [facility]N CM Short Term Goal #4 Start Date  02/18/15   Interventions for Short Term Goal #4  RNCM discused with pt the importance of using incentive spirometer in keeping lungs strong. Placed Incentive spirometer with in pt's reach.

## 2015-02-18 NOTE — Telephone Encounter (Signed)
[name] is at her home and says Cynthia [name] is in a lot of pain with her feet.  She does not have any pain medication.  [name] wants to know if you will call her back.  She thinks Mrs. Scarlette Sliceury needs something for pain.  Her call back is [phone].  [name] will be there for about an hr.  It's 10am now.  Thanks Barth Kirkseri

## 2015-02-18 NOTE — Telephone Encounter (Signed)
Patients niece Arline AspCindy called back wanting to know if we have sent anything to the pharmacy for Mrs. Scarlette Sliceury.  Her call back # is [phone]  She says she is in town now and going to Mrs Kaine's house later but will be going out of town and wants to get her something for the pain before she leaves.   Thanks, Fortune Brandsteri

## 2015-02-18 NOTE — Telephone Encounter (Signed)
Spoke with [place]. [name] states patient reported that her foot pain is 10/10, and feels like her feet are on fire. Patient did follow up with vascular, but was not prescribed a medication for pain. Patient is requesting a RX to help relief some of the foot pain.

## 2015-02-23 DIAGNOSIS — Z7952 Long term (current) use of systemic steroids: Secondary | ICD-10-CM | POA: Diagnosis not present

## 2015-02-23 DIAGNOSIS — Z7982 Long term (current) use of aspirin: Secondary | ICD-10-CM | POA: Diagnosis not present

## 2015-02-23 DIAGNOSIS — I10 Essential (primary) hypertension: Secondary | ICD-10-CM | POA: Diagnosis not present

## 2015-02-23 DIAGNOSIS — J9611 Chronic respiratory failure with hypoxia: Secondary | ICD-10-CM | POA: Diagnosis not present

## 2015-02-23 DIAGNOSIS — Z8701 Personal history of pneumonia (recurrent): Secondary | ICD-10-CM | POA: Diagnosis not present

## 2015-02-23 DIAGNOSIS — J441 Chronic obstructive pulmonary disease with (acute) exacerbation: Secondary | ICD-10-CM | POA: Diagnosis not present

## 2015-02-23 DIAGNOSIS — E039 Hypothyroidism, unspecified: Secondary | ICD-10-CM | POA: Diagnosis not present

## 2015-02-23 DIAGNOSIS — M1990 Unspecified osteoarthritis, unspecified site: Secondary | ICD-10-CM | POA: Diagnosis not present

## 2015-02-23 DIAGNOSIS — J45909 Unspecified asthma, uncomplicated: Secondary | ICD-10-CM | POA: Diagnosis not present

## 2015-02-24 DIAGNOSIS — Z8701 Personal history of pneumonia (recurrent): Secondary | ICD-10-CM | POA: Diagnosis not present

## 2015-02-24 DIAGNOSIS — I10 Essential (primary) hypertension: Secondary | ICD-10-CM | POA: Diagnosis not present

## 2015-02-24 DIAGNOSIS — J441 Chronic obstructive pulmonary disease with (acute) exacerbation: Secondary | ICD-10-CM | POA: Diagnosis not present

## 2015-02-24 DIAGNOSIS — E039 Hypothyroidism, unspecified: Secondary | ICD-10-CM | POA: Diagnosis not present

## 2015-02-24 DIAGNOSIS — J45909 Unspecified asthma, uncomplicated: Secondary | ICD-10-CM | POA: Diagnosis not present

## 2015-02-24 DIAGNOSIS — Z7952 Long term (current) use of systemic steroids: Secondary | ICD-10-CM | POA: Diagnosis not present

## 2015-02-24 DIAGNOSIS — Z7982 Long term (current) use of aspirin: Secondary | ICD-10-CM | POA: Diagnosis not present

## 2015-02-24 DIAGNOSIS — J9611 Chronic respiratory failure with hypoxia: Secondary | ICD-10-CM | POA: Diagnosis not present

## 2015-02-24 DIAGNOSIS — M1990 Unspecified osteoarthritis, unspecified site: Secondary | ICD-10-CM | POA: Diagnosis not present

## 2015-02-25 ENCOUNTER — Other Ambulatory Visit: Payer: Self-pay | Admitting: *Deleted

## 2015-02-25 NOTE — Patient Outreach (Signed)
RNCM called pt as part of the transition of care program. Pt reporting her pain has improved a lot with the pain medication added by her primary care MD. She stated she had been able to rest better. She stated she is still scheduled to have vein procedure on January 24th to improve circulation to lower legs. Pt reports her breathing has improved related to decreased pain and anxiety and she has been able to complete some of her IADLS such as her washing. Pt reports continuing to use incentive spirometer and participating in PT for her upper body. Pt was in good spirits and stating "just being able to get some sleep has really helped." Pt appreciative of the call.    Plan: RNCM will call pt next week as continued part of the transition of care program.  Costella HatcherJanci Beaulah Romanek RN, BSN  [facility] Care Management [phone]

## 2015-03-01 DIAGNOSIS — J301 Allergic rhinitis due to pollen: Secondary | ICD-10-CM | POA: Diagnosis not present

## 2015-03-01 DIAGNOSIS — J9611 Chronic respiratory failure with hypoxia: Secondary | ICD-10-CM | POA: Diagnosis not present

## 2015-03-01 DIAGNOSIS — J449 Chronic obstructive pulmonary disease, unspecified: Secondary | ICD-10-CM | POA: Diagnosis not present

## 2015-03-01 DIAGNOSIS — Z23 Encounter for immunization: Secondary | ICD-10-CM | POA: Diagnosis not present

## 2015-03-04 ENCOUNTER — Other Ambulatory Visit: Payer: Self-pay | Admitting: *Deleted

## 2015-03-04 DIAGNOSIS — J449 Chronic obstructive pulmonary disease, unspecified: Secondary | ICD-10-CM | POA: Diagnosis not present

## 2015-03-04 DIAGNOSIS — I739 Peripheral vascular disease, unspecified: Secondary | ICD-10-CM | POA: Diagnosis not present

## 2015-03-04 DIAGNOSIS — M79609 Pain in unspecified limb: Secondary | ICD-10-CM | POA: Diagnosis not present

## 2015-03-04 DIAGNOSIS — I70213 Atherosclerosis of native arteries of extremities with intermittent claudication, bilateral legs: Secondary | ICD-10-CM | POA: Diagnosis not present

## 2015-03-04 DIAGNOSIS — M7989 Other specified soft tissue disorders: Secondary | ICD-10-CM | POA: Diagnosis not present

## 2015-03-04 DIAGNOSIS — I10 Essential (primary) hypertension: Secondary | ICD-10-CM | POA: Diagnosis not present

## 2015-03-04 NOTE — Patient Outreach (Signed)
Pt called as part of the transition of care. Last call made today. Pt answered and stated she had just finished visiting the vein specialist and her veins were ok and they need to check her arteries. Pt stated she is going back Monday to have her arteries checked and the MD will make a plan from there. Pt stated her niece was there and wanted RNCM to talk with her. Niece Arline Asp talked with RNCM and stated pt was planning on having artery procedure next week. RNCM asked niece if there were any other needs or barriers and niece stated no that they had everything under control and did not have any needs. RNCM spoke back with pt and she stated her breathing was improved, pain meds were still helping and she was not interested in having a health coach call her at this time. Pt confirmed she still had RNCM's number and if there were barriers down the road she would f/u with [facility].  Plan: RNCM will close pt case.  RNCM will make care manager assistant aware of case closure.  Costella Hatcher RN, BSN  [facility] Care Management [phone])

## 2015-03-07 ENCOUNTER — Other Ambulatory Visit: Payer: Self-pay | Admitting: Vascular Surgery

## 2015-03-07 ENCOUNTER — Other Ambulatory Visit
Admission: RE | Admit: 2015-03-07 | Discharge: 2015-03-07 | Disposition: A | Discharge status: Home / Self Care | Payer: Commercial Managed Care - HMO | Source: Ambulatory Visit | Attending: Vascular Surgery | Admitting: Vascular Surgery

## 2015-03-07 DIAGNOSIS — I10 Essential (primary) hypertension: Secondary | ICD-10-CM | POA: Diagnosis not present

## 2015-03-07 DIAGNOSIS — E785 Hyperlipidemia, unspecified: Secondary | ICD-10-CM | POA: Diagnosis not present

## 2015-03-07 DIAGNOSIS — M79609 Pain in unspecified limb: Secondary | ICD-10-CM | POA: Diagnosis not present

## 2015-03-07 DIAGNOSIS — I70223 Atherosclerosis of native arteries of extremities with rest pain, bilateral legs: Secondary | ICD-10-CM | POA: Diagnosis not present

## 2015-03-07 DIAGNOSIS — M7989 Other specified soft tissue disorders: Secondary | ICD-10-CM | POA: Diagnosis not present

## 2015-03-07 DIAGNOSIS — Z029 Encounter for administrative examinations, unspecified: Secondary | ICD-10-CM | POA: Diagnosis present

## 2015-03-07 DIAGNOSIS — I739 Peripheral vascular disease, unspecified: Secondary | ICD-10-CM | POA: Diagnosis not present

## 2015-03-07 DIAGNOSIS — I70213 Atherosclerosis of native arteries of extremities with intermittent claudication, bilateral legs: Secondary | ICD-10-CM | POA: Diagnosis not present

## 2015-03-07 DIAGNOSIS — M1990 Unspecified osteoarthritis, unspecified site: Secondary | ICD-10-CM | POA: Diagnosis not present

## 2015-03-07 DIAGNOSIS — J449 Chronic obstructive pulmonary disease, unspecified: Secondary | ICD-10-CM | POA: Diagnosis not present

## 2015-03-07 LAB — BUN: BUN: 27 mg/dL — ABNORMAL HIGH (ref 6–20)

## 2015-03-07 LAB — CREATININE, SERUM
Creatinine, Ser: 1.11 mg/dL — ABNORMAL HIGH (ref 0.44–1.00)
GFR calc non Af Amer: 43 mL/min — ABNORMAL LOW (ref >60)
GFR, EST AFRICAN AMERICAN: 50 mL/min — AB (ref >60)

## 2015-03-11 DIAGNOSIS — J45909 Unspecified asthma, uncomplicated: Secondary | ICD-10-CM | POA: Diagnosis not present

## 2015-03-11 DIAGNOSIS — J441 Chronic obstructive pulmonary disease with (acute) exacerbation: Secondary | ICD-10-CM | POA: Diagnosis not present

## 2015-03-11 DIAGNOSIS — J9611 Chronic respiratory failure with hypoxia: Secondary | ICD-10-CM | POA: Diagnosis not present

## 2015-03-11 DIAGNOSIS — Z8701 Personal history of pneumonia (recurrent): Secondary | ICD-10-CM | POA: Diagnosis not present

## 2015-03-11 DIAGNOSIS — Z7982 Long term (current) use of aspirin: Secondary | ICD-10-CM | POA: Diagnosis not present

## 2015-03-11 DIAGNOSIS — I10 Essential (primary) hypertension: Secondary | ICD-10-CM | POA: Diagnosis not present

## 2015-03-11 DIAGNOSIS — M1990 Unspecified osteoarthritis, unspecified site: Secondary | ICD-10-CM | POA: Diagnosis not present

## 2015-03-11 DIAGNOSIS — E039 Hypothyroidism, unspecified: Secondary | ICD-10-CM | POA: Diagnosis not present

## 2015-03-11 DIAGNOSIS — Z7952 Long term (current) use of systemic steroids: Secondary | ICD-10-CM | POA: Diagnosis not present

## 2015-03-14 ENCOUNTER — Telehealth: Payer: Self-pay

## 2015-03-14 DIAGNOSIS — E039 Hypothyroidism, unspecified: Secondary | ICD-10-CM | POA: Diagnosis not present

## 2015-03-14 DIAGNOSIS — J441 Chronic obstructive pulmonary disease with (acute) exacerbation: Secondary | ICD-10-CM | POA: Diagnosis not present

## 2015-03-14 DIAGNOSIS — I10 Essential (primary) hypertension: Secondary | ICD-10-CM | POA: Diagnosis not present

## 2015-03-14 DIAGNOSIS — Z8701 Personal history of pneumonia (recurrent): Secondary | ICD-10-CM | POA: Diagnosis not present

## 2015-03-14 DIAGNOSIS — Z7982 Long term (current) use of aspirin: Secondary | ICD-10-CM | POA: Diagnosis not present

## 2015-03-14 DIAGNOSIS — J45909 Unspecified asthma, uncomplicated: Secondary | ICD-10-CM | POA: Diagnosis not present

## 2015-03-14 DIAGNOSIS — Z7952 Long term (current) use of systemic steroids: Secondary | ICD-10-CM | POA: Diagnosis not present

## 2015-03-14 DIAGNOSIS — M1990 Unspecified osteoarthritis, unspecified site: Secondary | ICD-10-CM | POA: Diagnosis not present

## 2015-03-14 DIAGNOSIS — J9611 Chronic respiratory failure with hypoxia: Secondary | ICD-10-CM | POA: Diagnosis not present

## 2015-03-14 NOTE — Telephone Encounter (Signed)
Angelique Blonder, a home health nurse is calling wanting a verbal order for skilled nursing at the home. Please call back at [phone]. Thanks!

## 2015-03-14 NOTE — Telephone Encounter (Signed)
Left detailed message on vm giving verbal order.

## 2015-03-14 NOTE — Telephone Encounter (Signed)
That's fine

## 2015-03-16 ENCOUNTER — Encounter
Admission: RE | Disposition: A | Discharge status: Home / Self Care | Payer: Self-pay | Source: Ambulatory Visit | Attending: Vascular Surgery

## 2015-03-16 ENCOUNTER — Encounter: Payer: Self-pay | Admitting: *Deleted

## 2015-03-16 ENCOUNTER — Ambulatory Visit
Admission: RE | Admit: 2015-03-16 | Discharge: 2015-03-16 | Disposition: A | Discharge status: Home / Self Care | Payer: Commercial Managed Care - HMO | Source: Ambulatory Visit | Attending: Vascular Surgery | Admitting: Vascular Surgery

## 2015-03-16 DIAGNOSIS — Z87891 Personal history of nicotine dependence: Secondary | ICD-10-CM | POA: Insufficient documentation

## 2015-03-16 DIAGNOSIS — J449 Chronic obstructive pulmonary disease, unspecified: Secondary | ICD-10-CM | POA: Diagnosis not present

## 2015-03-16 DIAGNOSIS — M7989 Other specified soft tissue disorders: Secondary | ICD-10-CM | POA: Diagnosis not present

## 2015-03-16 DIAGNOSIS — I70213 Atherosclerosis of native arteries of extremities with intermittent claudication, bilateral legs: Secondary | ICD-10-CM | POA: Diagnosis not present

## 2015-03-16 DIAGNOSIS — I70223 Atherosclerosis of native arteries of extremities with rest pain, bilateral legs: Secondary | ICD-10-CM | POA: Diagnosis not present

## 2015-03-16 DIAGNOSIS — I10 Essential (primary) hypertension: Secondary | ICD-10-CM | POA: Diagnosis not present

## 2015-03-16 DIAGNOSIS — I868 Varicose veins of other specified sites: Secondary | ICD-10-CM | POA: Insufficient documentation

## 2015-03-16 DIAGNOSIS — Z8673 Personal history of transient ischemic attack (TIA), and cerebral infarction without residual deficits: Secondary | ICD-10-CM | POA: Diagnosis not present

## 2015-03-16 DIAGNOSIS — Z7982 Long term (current) use of aspirin: Secondary | ICD-10-CM | POA: Diagnosis not present

## 2015-03-16 DIAGNOSIS — M79609 Pain in unspecified limb: Secondary | ICD-10-CM | POA: Diagnosis not present

## 2015-03-16 DIAGNOSIS — I2510 Atherosclerotic heart disease of native coronary artery without angina pectoris: Secondary | ICD-10-CM | POA: Insufficient documentation

## 2015-03-16 DIAGNOSIS — E785 Hyperlipidemia, unspecified: Secondary | ICD-10-CM | POA: Diagnosis not present

## 2015-03-16 DIAGNOSIS — I739 Peripheral vascular disease, unspecified: Secondary | ICD-10-CM | POA: Diagnosis not present

## 2015-03-16 DIAGNOSIS — Z79899 Other long term (current) drug therapy: Secondary | ICD-10-CM | POA: Diagnosis not present

## 2015-03-16 DIAGNOSIS — M1990 Unspecified osteoarthritis, unspecified site: Secondary | ICD-10-CM | POA: Diagnosis not present

## 2015-03-16 HISTORY — PX: PERIPHERAL VASCULAR CATHETERIZATION: SHX172C

## 2015-03-16 SURGERY — ABDOMINAL AORTOGRAM W/LOWER EXTREMITY
Wound class: Clean

## 2015-03-16 MED ORDER — SODIUM CHLORIDE 0.9 % IV SOLN
INTRAVENOUS | Status: DC
  Administered 2015-03-16: 08:00:00 via INTRAVENOUS

## 2015-03-16 MED ORDER — MIDAZOLAM HCL 2 MG/2ML IJ SOLN
INTRAMUSCULAR | Status: DC | PRN
  Administered 2015-03-16: 2 mg via INTRAVENOUS

## 2015-03-16 MED ORDER — FENTANYL CITRATE (PF) 100 MCG/2ML IJ SOLN
INTRAMUSCULAR | Status: DC | PRN
  Administered 2015-03-16: 50 ug via INTRAVENOUS

## 2015-03-16 MED ORDER — HEPARIN (PORCINE) IN NACL 2-0.9 UNIT/ML-% IJ SOLN
INTRAMUSCULAR | Status: AC
  Filled 2015-03-16: qty 1000

## 2015-03-16 MED ORDER — SODIUM BICARBONATE 8.4 % IV SOLN
INTRAVENOUS | Status: AC
  Administered 2015-03-16: 09:00:00 via INTRAVENOUS
  Filled 2015-03-16: qty 500

## 2015-03-16 MED ORDER — MIDAZOLAM HCL 5 MG/5ML IJ SOLN
INTRAMUSCULAR | Status: AC
  Filled 2015-03-16: qty 5

## 2015-03-16 MED ORDER — METHYLPREDNISOLONE SODIUM SUCC 125 MG IJ SOLR: 125.0000 mg | INTRAMUSCULAR | Status: DC | PRN

## 2015-03-16 MED ORDER — IOHEXOL 300 MG/ML  SOLN
INTRAMUSCULAR | Status: DC | PRN
  Administered 2015-03-16: 90 mL via INTRA_ARTERIAL

## 2015-03-16 MED ORDER — FENTANYL CITRATE (PF) 100 MCG/2ML IJ SOLN
INTRAMUSCULAR | Status: AC
  Filled 2015-03-16: qty 2

## 2015-03-16 MED ORDER — LIDOCAINE HCL 1 % IJ SOLN
INTRAMUSCULAR | Status: DC | PRN
  Administered 2015-03-16: 5 mL

## 2015-03-16 MED ORDER — CEFUROXIME SODIUM 1.5 G IJ SOLR
INTRAMUSCULAR | Status: AC
  Filled 2015-03-16 (×23): qty 1.5

## 2015-03-16 MED ORDER — LIDOCAINE HCL (PF) 1 % IJ SOLN
INTRAMUSCULAR | Status: AC
  Filled 2015-03-16: qty 30

## 2015-03-16 MED ORDER — ONDANSETRON HCL 4 MG/2ML IJ SOLN: 4.0000 mg | Freq: Four times a day (QID) | INTRAMUSCULAR | Status: DC | PRN

## 2015-03-16 MED ORDER — HEPARIN SODIUM (PORCINE) 1000 UNIT/ML IJ SOLN
INTRAMUSCULAR | Status: AC
  Filled 2015-03-16: qty 1

## 2015-03-16 MED ORDER — SODIUM BICARBONATE BOLUS VIA INFUSION
INTRAVENOUS | Status: AC
  Administered 2015-03-16: 08:00:00 via INTRAVENOUS
  Filled 2015-03-16: qty 1

## 2015-03-16 MED ORDER — FAMOTIDINE 20 MG PO TABS: 40.0000 mg | ORAL_TABLET | ORAL | Status: DC | PRN

## 2015-03-16 MED ORDER — DEXTROSE 5 % IV SOLN
1.5000 g | INTRAVENOUS | Status: AC
  Administered 2015-03-16: 1.5 g via INTRAVENOUS

## 2015-03-16 SURGICAL SUPPLY — 11 items
CATH PIG 70CM (CATHETERS) ×5 IMPLANT
CATH RIM 65CM (CATHETERS) ×5 IMPLANT
DEVICE TORQUE (MISCELLANEOUS) ×5 IMPLANT
GLIDECATH 4FR STR (CATHETERS) ×5 IMPLANT
GLIDEWIRE ANGLED SS 035X260CM (WIRE) ×5 IMPLANT
PACK ANGIOGRAPHY (CUSTOM PROCEDURE TRAY) ×5 IMPLANT
SET INTRO CAPELLA COAXIAL (SET/KITS/TRAYS/PACK) ×5 IMPLANT
SHEATH BRITE TIP 5FRX11 (SHEATH) ×5 IMPLANT
SYR MEDRAD MARK V 150ML (SYRINGE) ×5 IMPLANT
TUBING CONTRAST HIGH PRESS 72 (TUBING) ×5 IMPLANT
WIRE J 3MM .035X145CM (WIRE) ×5 IMPLANT

## 2015-03-16 NOTE — Op Note (Signed)
[facility]  Percutaneous Study/Intervention Procedural Note   Date of Surgery: 03/16/2015  Surgeon:Karlissa Aron, Latina Craver   Pre-operative Diagnosis: Atherosclerotic occlusive disease bilateral lower extremities with rest pain  Post-operative diagnosis:  Same  Procedure(s) Performed:  1.  Abdominal aortogram  2.  Left lower extremity angiography third order catheter placement    Anesthesia: Conscious sedation was administered under my direct supervision. IV Versed plus fentanyl were utilized. Continuous ECG, pulse oximetry and blood pressure was monitored throughout the entire procedure. A total of 3 milligrams of Versed and 50 micrograms of fentanyl were utilized.  Conscious sedation was begun at  8:14 AM and concluded at 9:05 AM for a total of 49 minutes.  Sheath: 5 French right common femoral artery  Contrast: 90 cc  Fluoroscopy Time: 8.6 minutes  Indications:  Patient presented to the office with increasing pain in her lower extremities left greater than right. Physical examination as well as noninvasive studies suggested profound atherosclerotic occlusive disease and she is undergoing angiography with the hope for intervention for relief of her rest pain.  Procedure:  Orian Amberg Euryis a 82 y.o. female who was identified and appropriate procedural time out was performed.  The patient was then placed supine on the table and prepped and draped in the usual sterile fashion.  Ultrasound was used to evaluate the right common femoral artery.  It was patent .  A digital ultrasound image was acquired.  A micropuncture needle was used to access the right common femoral artery under direct ultrasound guidance and a permanent image was performed. Microwire was then advanced under fluoroscopy followed by micro-sheath.  A 0.035 J wire was advanced without resistance and a 5Fr sheath was placed.    Catheter was advanced to the level of T12 and AP projection of the abdominal aorta  was obtained. Bilateral oblique views of the pelvis were then obtained after repositioning of the pigtail catheter to above the bifurcation. Using a rim catheter and a floppy Glidewire the aortic bifurcation was crossed and the wire negotiated into the left SFA subsequently a straight 4 French glide catheter was advanced over the wire and positioned with its tip in the SFA. Distal runoff was then obtained. After review the images oblique views of the femoral arteries were obtained and then the sheath was pulled and pressure held there were no immediate complications.  Findings:   Aortogram:  The abdominal aorta is opacified with a bolus injection of contrast. There is aneurysmal dilatation into locations which are separated by a Heaney hemodynamically significant stricture giving the appearance of an apple core type lesion. This stenosis is approximately 2-3 cm above the aortic bifurcation. The right common iliac demonstrates diffuse disease with moderate plaque formation. The left common iliac demonstrates a greater than 80% stricture from its origin extending several centimeters into the common iliac. The distal one third of the left common iliac demonstrates poststenotic dilatation. The external iliac arteries are patent bilaterally on the right near the ileo-inguinal ligament there is approximately a 60% stenosis the left although demonstrate diffuse disease does not have any hemodynamically significant lesions noted.  Right Lower Extremity:The right common femoral demonstrates profound disease with flow limiting stenosis. The profunda femoris and superficial femoral arteries are both patent. The SFA fills a patent popliteal and there appears to be single vessel runoff to the foot via the anterior tibial.  Left Lower Extremity:The left common femoral demonstrates profound flow limiting disease. Profunda femoris again is patent superficial femoral artery  is patent although there is a subtotal occlusion at  the level of Hunter's canal this is relatively focused. There appears to be single vessel runoff to the foot via the anterior tibial.    Given these findings the patient would benefit most from bilateral femoral endarterectomies with reconstruction of the distal aorta and stenting of the common iliac arteries. Given the nature of the iliac stenosis at the origins kissing stents would be required. Intervention of the left SFA could be considered at the time of aortic iliac and common femoral reconstruction. However, given her age and overall health further treatment will be up to the patient.     Disposition: Patient was taken to the recovery room in stable condition having tolerated the procedure well.  Clayson Riling, Latina Craver 03/16/2015,9:59 AM

## 2015-03-16 NOTE — H&P (Signed)
[facility] History & Physical Update  The patient was interviewed and re-examined.  The patient's previous History and Physical has been reviewed and is unchanged.  There is no change in the plan of care. We plan to proceed with the scheduled procedure.  Gwendolyn Nishi, Latina Craver, MD  03/16/2015, 9:06 AM

## 2015-03-18 ENCOUNTER — Encounter: Payer: Self-pay | Admitting: Vascular Surgery

## 2015-03-18 DIAGNOSIS — M1990 Unspecified osteoarthritis, unspecified site: Secondary | ICD-10-CM | POA: Diagnosis not present

## 2015-03-18 DIAGNOSIS — I10 Essential (primary) hypertension: Secondary | ICD-10-CM | POA: Diagnosis not present

## 2015-03-18 DIAGNOSIS — Z7982 Long term (current) use of aspirin: Secondary | ICD-10-CM | POA: Diagnosis not present

## 2015-03-18 DIAGNOSIS — E039 Hypothyroidism, unspecified: Secondary | ICD-10-CM | POA: Diagnosis not present

## 2015-03-18 DIAGNOSIS — Z8701 Personal history of pneumonia (recurrent): Secondary | ICD-10-CM | POA: Diagnosis not present

## 2015-03-18 DIAGNOSIS — Z7952 Long term (current) use of systemic steroids: Secondary | ICD-10-CM | POA: Diagnosis not present

## 2015-03-18 DIAGNOSIS — J441 Chronic obstructive pulmonary disease with (acute) exacerbation: Secondary | ICD-10-CM | POA: Diagnosis not present

## 2015-03-18 DIAGNOSIS — J45909 Unspecified asthma, uncomplicated: Secondary | ICD-10-CM | POA: Diagnosis not present

## 2015-03-18 DIAGNOSIS — J9611 Chronic respiratory failure with hypoxia: Secondary | ICD-10-CM | POA: Diagnosis not present

## 2015-03-22 DIAGNOSIS — Z7952 Long term (current) use of systemic steroids: Secondary | ICD-10-CM | POA: Diagnosis not present

## 2015-03-22 DIAGNOSIS — I10 Essential (primary) hypertension: Secondary | ICD-10-CM | POA: Diagnosis not present

## 2015-03-22 DIAGNOSIS — Z7982 Long term (current) use of aspirin: Secondary | ICD-10-CM | POA: Diagnosis not present

## 2015-03-22 DIAGNOSIS — J9611 Chronic respiratory failure with hypoxia: Secondary | ICD-10-CM | POA: Diagnosis not present

## 2015-03-22 DIAGNOSIS — M1990 Unspecified osteoarthritis, unspecified site: Secondary | ICD-10-CM | POA: Diagnosis not present

## 2015-03-22 DIAGNOSIS — E039 Hypothyroidism, unspecified: Secondary | ICD-10-CM | POA: Diagnosis not present

## 2015-03-22 DIAGNOSIS — J45909 Unspecified asthma, uncomplicated: Secondary | ICD-10-CM | POA: Diagnosis not present

## 2015-03-22 DIAGNOSIS — J441 Chronic obstructive pulmonary disease with (acute) exacerbation: Secondary | ICD-10-CM | POA: Diagnosis not present

## 2015-03-22 DIAGNOSIS — Z8701 Personal history of pneumonia (recurrent): Secondary | ICD-10-CM | POA: Diagnosis not present

## 2015-03-24 DIAGNOSIS — J9611 Chronic respiratory failure with hypoxia: Secondary | ICD-10-CM | POA: Diagnosis not present

## 2015-03-24 DIAGNOSIS — I10 Essential (primary) hypertension: Secondary | ICD-10-CM | POA: Diagnosis not present

## 2015-03-24 DIAGNOSIS — I739 Peripheral vascular disease, unspecified: Secondary | ICD-10-CM | POA: Diagnosis not present

## 2015-03-24 DIAGNOSIS — I70213 Atherosclerosis of native arteries of extremities with intermittent claudication, bilateral legs: Secondary | ICD-10-CM | POA: Diagnosis not present

## 2015-03-24 DIAGNOSIS — J45909 Unspecified asthma, uncomplicated: Secondary | ICD-10-CM | POA: Diagnosis not present

## 2015-03-24 DIAGNOSIS — Z8701 Personal history of pneumonia (recurrent): Secondary | ICD-10-CM | POA: Diagnosis not present

## 2015-03-24 DIAGNOSIS — M79609 Pain in unspecified limb: Secondary | ICD-10-CM | POA: Diagnosis not present

## 2015-03-24 DIAGNOSIS — E785 Hyperlipidemia, unspecified: Secondary | ICD-10-CM | POA: Diagnosis not present

## 2015-03-24 DIAGNOSIS — J449 Chronic obstructive pulmonary disease, unspecified: Secondary | ICD-10-CM | POA: Diagnosis not present

## 2015-03-24 DIAGNOSIS — Z7952 Long term (current) use of systemic steroids: Secondary | ICD-10-CM | POA: Diagnosis not present

## 2015-03-24 DIAGNOSIS — M7989 Other specified soft tissue disorders: Secondary | ICD-10-CM | POA: Diagnosis not present

## 2015-03-24 DIAGNOSIS — M1990 Unspecified osteoarthritis, unspecified site: Secondary | ICD-10-CM | POA: Diagnosis not present

## 2015-03-24 DIAGNOSIS — I70223 Atherosclerosis of native arteries of extremities with rest pain, bilateral legs: Secondary | ICD-10-CM | POA: Diagnosis not present

## 2015-03-24 DIAGNOSIS — Z7982 Long term (current) use of aspirin: Secondary | ICD-10-CM | POA: Diagnosis not present

## 2015-03-24 DIAGNOSIS — J441 Chronic obstructive pulmonary disease with (acute) exacerbation: Secondary | ICD-10-CM | POA: Diagnosis not present

## 2015-03-24 DIAGNOSIS — E039 Hypothyroidism, unspecified: Secondary | ICD-10-CM | POA: Diagnosis not present

## 2015-03-29 DIAGNOSIS — I714 Abdominal aortic aneurysm, without rupture: Secondary | ICD-10-CM | POA: Diagnosis not present

## 2015-03-29 DIAGNOSIS — R0602 Shortness of breath: Secondary | ICD-10-CM | POA: Diagnosis not present

## 2015-03-29 DIAGNOSIS — I10 Essential (primary) hypertension: Secondary | ICD-10-CM | POA: Diagnosis not present

## 2015-03-29 DIAGNOSIS — I739 Peripheral vascular disease, unspecified: Secondary | ICD-10-CM | POA: Diagnosis not present

## 2015-03-30 ENCOUNTER — Telehealth: Payer: Self-pay | Admitting: Family Medicine

## 2015-03-30 NOTE — Telephone Encounter (Signed)
Pt niece, Arline Asp called to request a Rx to help with the pain in her feet and legs.  Pt has rec'd this before but Arline Asp is not sure of the name.  [facility] [address]  CB#[phone] until 3pm and [phone] after 3pm/MW

## 2015-03-31 ENCOUNTER — Other Ambulatory Visit: Payer: Self-pay | Admitting: Family Medicine

## 2015-03-31 MED ORDER — KETOROLAC TROMETHAMINE 10 MG PO TABS: 10.0000 mg | ORAL_TABLET | Freq: Four times a day (QID) | ORAL | Status: DC | PRN

## 2015-03-31 NOTE — Telephone Encounter (Signed)
Pt's niece Arline Asp called back about the RX. Arline Asp stated that she doesn't remember the name of the medication but it was sent in about a month ago. Arline Asp would like this sent in today b/c she is going out of town tomorrow. Arline Asp would like a call back to let her know if something is or isn't going to be sent today. I looked in pt's charts and believe that they are talking about ketorolac (TORADOL) 10 MG tablet and would like it sent to [facility]. Thanks TNP

## 2015-03-31 NOTE — Telephone Encounter (Signed)
Pt's niece has called back again wanting to know if we had called on the Toradol .  Please advise.  [phone]  Thanks Barth Kirks

## 2015-03-31 NOTE — Telephone Encounter (Signed)
Looks like Dennis prescribed Toradol  on 02/18/2015 for pain. Patient niece is wanting a call back today on whether we will call in pain medication for patient.

## 2015-03-31 NOTE — Telephone Encounter (Signed)
Dennis patient. Patient's niece which will be picking up RX is going out of town in the morning. Can you authorize a refill?

## 2015-03-31 NOTE — Telephone Encounter (Signed)
RX called in at [facility]. Patient's niece Arline Asp is aware.

## 2015-03-31 NOTE — Telephone Encounter (Signed)
Please call in for patient

## 2015-04-01 DIAGNOSIS — J441 Chronic obstructive pulmonary disease with (acute) exacerbation: Secondary | ICD-10-CM | POA: Diagnosis not present

## 2015-04-01 DIAGNOSIS — I10 Essential (primary) hypertension: Secondary | ICD-10-CM | POA: Diagnosis not present

## 2015-04-01 DIAGNOSIS — J9611 Chronic respiratory failure with hypoxia: Secondary | ICD-10-CM | POA: Diagnosis not present

## 2015-04-01 DIAGNOSIS — Z8701 Personal history of pneumonia (recurrent): Secondary | ICD-10-CM | POA: Diagnosis not present

## 2015-04-01 DIAGNOSIS — E039 Hypothyroidism, unspecified: Secondary | ICD-10-CM | POA: Diagnosis not present

## 2015-04-01 DIAGNOSIS — J45909 Unspecified asthma, uncomplicated: Secondary | ICD-10-CM | POA: Diagnosis not present

## 2015-04-01 DIAGNOSIS — Z7982 Long term (current) use of aspirin: Secondary | ICD-10-CM | POA: Diagnosis not present

## 2015-04-01 DIAGNOSIS — M1990 Unspecified osteoarthritis, unspecified site: Secondary | ICD-10-CM | POA: Diagnosis not present

## 2015-04-01 DIAGNOSIS — Z7952 Long term (current) use of systemic steroids: Secondary | ICD-10-CM | POA: Diagnosis not present

## 2015-04-03 DIAGNOSIS — M1990 Unspecified osteoarthritis, unspecified site: Secondary | ICD-10-CM | POA: Diagnosis not present

## 2015-04-03 DIAGNOSIS — I10 Essential (primary) hypertension: Secondary | ICD-10-CM | POA: Diagnosis not present

## 2015-04-03 DIAGNOSIS — J9611 Chronic respiratory failure with hypoxia: Secondary | ICD-10-CM | POA: Diagnosis not present

## 2015-04-03 DIAGNOSIS — J45909 Unspecified asthma, uncomplicated: Secondary | ICD-10-CM | POA: Diagnosis not present

## 2015-04-03 DIAGNOSIS — E039 Hypothyroidism, unspecified: Secondary | ICD-10-CM | POA: Diagnosis not present

## 2015-04-03 DIAGNOSIS — Z8701 Personal history of pneumonia (recurrent): Secondary | ICD-10-CM | POA: Diagnosis not present

## 2015-04-03 DIAGNOSIS — J441 Chronic obstructive pulmonary disease with (acute) exacerbation: Secondary | ICD-10-CM | POA: Diagnosis not present

## 2015-04-03 DIAGNOSIS — Z7982 Long term (current) use of aspirin: Secondary | ICD-10-CM | POA: Diagnosis not present

## 2015-04-03 DIAGNOSIS — Z7952 Long term (current) use of systemic steroids: Secondary | ICD-10-CM | POA: Diagnosis not present

## 2015-04-04 ENCOUNTER — Other Ambulatory Visit: Payer: Self-pay | Admitting: Vascular Surgery

## 2015-04-07 DIAGNOSIS — J449 Chronic obstructive pulmonary disease, unspecified: Secondary | ICD-10-CM | POA: Diagnosis not present

## 2015-04-08 DIAGNOSIS — E7800 Pure hypercholesterolemia, unspecified: Secondary | ICD-10-CM | POA: Diagnosis not present

## 2015-04-08 DIAGNOSIS — J449 Chronic obstructive pulmonary disease, unspecified: Secondary | ICD-10-CM | POA: Diagnosis not present

## 2015-04-08 DIAGNOSIS — N183 Chronic kidney disease, stage 3 (moderate): Secondary | ICD-10-CM | POA: Diagnosis not present

## 2015-04-08 DIAGNOSIS — M1991 Primary osteoarthritis, unspecified site: Secondary | ICD-10-CM | POA: Diagnosis not present

## 2015-04-08 DIAGNOSIS — J9611 Chronic respiratory failure with hypoxia: Secondary | ICD-10-CM | POA: Diagnosis not present

## 2015-04-08 DIAGNOSIS — I2510 Atherosclerotic heart disease of native coronary artery without angina pectoris: Secondary | ICD-10-CM | POA: Diagnosis not present

## 2015-04-08 DIAGNOSIS — J45909 Unspecified asthma, uncomplicated: Secondary | ICD-10-CM | POA: Diagnosis not present

## 2015-04-08 DIAGNOSIS — I129 Hypertensive chronic kidney disease with stage 1 through stage 4 chronic kidney disease, or unspecified chronic kidney disease: Secondary | ICD-10-CM | POA: Diagnosis not present

## 2015-04-08 DIAGNOSIS — M4806 Spinal stenosis, lumbar region: Secondary | ICD-10-CM | POA: Diagnosis not present

## 2015-04-10 DIAGNOSIS — I129 Hypertensive chronic kidney disease with stage 1 through stage 4 chronic kidney disease, or unspecified chronic kidney disease: Secondary | ICD-10-CM | POA: Diagnosis not present

## 2015-04-10 DIAGNOSIS — M4806 Spinal stenosis, lumbar region: Secondary | ICD-10-CM | POA: Diagnosis not present

## 2015-04-10 DIAGNOSIS — J9611 Chronic respiratory failure with hypoxia: Secondary | ICD-10-CM | POA: Diagnosis not present

## 2015-04-10 DIAGNOSIS — I2510 Atherosclerotic heart disease of native coronary artery without angina pectoris: Secondary | ICD-10-CM | POA: Diagnosis not present

## 2015-04-10 DIAGNOSIS — N183 Chronic kidney disease, stage 3 (moderate): Secondary | ICD-10-CM | POA: Diagnosis not present

## 2015-04-10 DIAGNOSIS — J449 Chronic obstructive pulmonary disease, unspecified: Secondary | ICD-10-CM | POA: Diagnosis not present

## 2015-04-10 DIAGNOSIS — M1991 Primary osteoarthritis, unspecified site: Secondary | ICD-10-CM | POA: Diagnosis not present

## 2015-04-10 DIAGNOSIS — J45909 Unspecified asthma, uncomplicated: Secondary | ICD-10-CM | POA: Diagnosis not present

## 2015-04-10 DIAGNOSIS — E7800 Pure hypercholesterolemia, unspecified: Secondary | ICD-10-CM | POA: Diagnosis not present

## 2015-04-11 ENCOUNTER — Encounter
Admission: RE | Admit: 2015-04-11 | Discharge: 2015-04-11 | Disposition: A | Discharge status: Home / Self Care | Payer: Commercial Managed Care - HMO | Source: Ambulatory Visit | Attending: Vascular Surgery | Admitting: Vascular Surgery

## 2015-04-11 ENCOUNTER — Ambulatory Visit
Admission: RE | Admit: 2015-04-11 | Discharge: 2015-04-11 | Disposition: A | Discharge status: Home / Self Care | Payer: Commercial Managed Care - HMO | Source: Ambulatory Visit | Attending: Vascular Surgery | Admitting: Vascular Surgery

## 2015-04-11 DIAGNOSIS — I700 Atherosclerosis of aorta: Secondary | ICD-10-CM | POA: Insufficient documentation

## 2015-04-11 DIAGNOSIS — R918 Other nonspecific abnormal finding of lung field: Secondary | ICD-10-CM | POA: Insufficient documentation

## 2015-04-11 DIAGNOSIS — Z01818 Encounter for other preprocedural examination: Secondary | ICD-10-CM | POA: Diagnosis not present

## 2015-04-11 LAB — BASIC METABOLIC PANEL
ANION GAP: 9 (ref 5–15)
BUN: 17 mg/dL (ref 6–20)
CALCIUM: 9.1 mg/dL (ref 8.9–10.3)
CO2: 28 mmol/L (ref 22–32)
Chloride: 104 mmol/L (ref 101–111)
Creatinine, Ser: 1.01 mg/dL — ABNORMAL HIGH (ref 0.44–1.00)
GFR calc Af Amer: 56 mL/min — ABNORMAL LOW (ref >60)
GFR calc non Af Amer: 49 mL/min — ABNORMAL LOW (ref >60)
GLUCOSE: 94 mg/dL (ref 65–99)
Potassium: 4.2 mmol/L (ref 3.5–5.1)
Sodium: 141 mmol/L (ref 135–145)

## 2015-04-11 LAB — SURGICAL PCR SCREEN
MRSA, PCR: NEGATIVE
Staphylococcus aureus: NEGATIVE

## 2015-04-11 LAB — CBC WITH DIFFERENTIAL/PLATELET
BASOS PCT: 1 %
Basophils Absolute: 0 10*3/uL (ref 0–0.1)
Eosinophils Absolute: 0 10*3/uL (ref 0–0.7)
Eosinophils Relative: 1 %
HEMATOCRIT: 38.8 % (ref 35.0–47.0)
Hemoglobin: 12.8 g/dL (ref 12.0–16.0)
LYMPHS PCT: 20 %
Lymphs Abs: 1.6 10*3/uL (ref 1.0–3.6)
MCH: 31.2 pg (ref 26.0–34.0)
MCHC: 33 g/dL (ref 32.0–36.0)
MCV: 94.5 fL (ref 80.0–100.0)
MONO ABS: 0.7 10*3/uL (ref 0.2–0.9)
MONOS PCT: 9 %
NEUTROS ABS: 5.6 10*3/uL (ref 1.4–6.5)
Neutrophils Relative %: 69 %
Platelets: 255 10*3/uL (ref 150–440)
RBC: 4.1 MIL/uL (ref 3.80–5.20)
RDW: 13.4 % (ref 11.5–14.5)
WBC: 8 10*3/uL (ref 3.6–11.0)

## 2015-04-11 LAB — PROTIME-INR
INR: 0.96
Prothrombin Time: 13 seconds (ref 11.4–15.0)

## 2015-04-11 LAB — ABO/RH: ABO/RH(D): O POS

## 2015-04-11 LAB — APTT: aPTT: 27 seconds (ref 24–36)

## 2015-04-11 NOTE — Patient Instructions (Signed)
  Your procedure is scheduled on: Wednesday 04/20/15 Report to Day Surgery. 2ND FLOOR MEDICAL MALL ENTRANCE To find out your arrival time please call [phone] between 1PM - 3PM on Tuesday 04/19/15.  Remember: Instructions that are not followed completely may result in serious medical risk, up to and including death, or upon the discretion of your surgeon and anesthesiologist your surgery may need to be rescheduled.    __X__ 1. Do not eat food or drink liquids after midnight. No gum chewing or hard candies.     __X__ 2. No Alcohol for 24 hours before or after surgery.   ____ 3. Bring all medications with you on the day of surgery if instructed.    __X__ 4. Notify your doctor if there is any change in your medical condition     (cold, fever, infections).     Do not wear jewelry, make-up, hairpins, clips or nail polish.  Do not wear lotions, powders, or perfumes.   Do not shave 48 hours prior to surgery. Men may shave face and neck.  Do not bring valuables to the hospital.    [facility] is not responsible for any belongings or valuables.               Contacts, dentures or bridgework may not be worn into surgery.  Leave your suitcase in the car. After surgery it may be brought to your room.  For patients admitted to the hospital, discharge time is determined by your                treatment team.   Patients discharged the day of surgery will not be allowed to drive home.   Please read over the following fact sheets that you were given:   MRSA Information and Surgical Site Infection Prevention   __X__ Take these medicines the morning of surgery with A SIP OF WATER:    1. FEXOFENADINE  2. ISOSORBIDE  3. PREVACID  4. LEVOTHYROXINE  5. PREDNISONE  6. MAGNESIUM  ____ Fleet Enema (as directed)   __X__ Use CHG Soap as directed  __X__ Use inhalers on the day of surgery AND NEBULIZER  ____ Stop metformin 2 days prior to surgery    ____ Take 1/2 of usual insulin dose the night  before surgery and none on the morning of surgery.   ____ Stop Coumadin/Plavix/aspirin on   __X__ Stop Anti-inflammatories on TODAY (MELOXICAM, KETOROLAC, IBUPROFEN, DICLOFENAC,   __X__ Stop supplements until after surgery.  FISH OIL, VITAMIN C  ____ Bring C-Pap to the hospital.

## 2015-04-12 DIAGNOSIS — I739 Peripheral vascular disease, unspecified: Secondary | ICD-10-CM | POA: Diagnosis not present

## 2015-04-12 DIAGNOSIS — I10 Essential (primary) hypertension: Secondary | ICD-10-CM | POA: Diagnosis not present

## 2015-04-12 DIAGNOSIS — I714 Abdominal aortic aneurysm, without rupture: Secondary | ICD-10-CM | POA: Diagnosis not present

## 2015-04-12 DIAGNOSIS — R0602 Shortness of breath: Secondary | ICD-10-CM | POA: Diagnosis not present

## 2015-04-13 NOTE — Pre-Procedure Instructions (Signed)
Value Range  LV Ejection Fraction (%) 55   Aortic Valve Stenosis Grade none   Aortic Valve Regurgitation Grade none   Aortic Valve Max Velocity (m/s) 1.5 m/sec   Aortic Valve Stenosis Mean Gradient (mmHg) 5.0 mmHg   Mitral Valve Regurgitation Grade mild   Mitral Valve Stenosis Grade none   Tricuspid Valve Regurgitation Grade mild   Tricuspid Valve Regurgitation Max Velocity (m/s) 2.2 m/sec   Right Ventricle Systolic Pressure (mmHg) 24.7 mmHg   LV End Diastolic Diameter (cm) 4.5 cm  LV End Systolic Diameter (cm) 3.1 cm  LV Septum Wall Thickness (cm) 0.86 cm  LV Posterior Wall Thickness (cm) 0.73 cm  Left Atrium Diameter (cm) 3.3 cm   Result Narrative                      CARDIOLOGY DEPARTMENT                        CHASEY, DULL                        [facility]                           [id number]                   [facility]                       Acct #: [id number]         [address] [place], [place] [address]             Date: 04/12/2015 08:16 AM                                                                  Adult Female Age: 82 yrs                     ECHOCARDIOGRAM REPORT                        Outpatient          STUDY:CHEST WALL         TAPE:0000:00: 0:00:00          KC::KCWC           ECHO:Yes   DOPPLER:Yes  FILE:[id number]              MD1:  KOWALSKI, BRUCE JAY          COLOR:Yes  CONTRAST:No       MACHINE:Philips      RV BIOPSY:No         3D:No    SOUND QLTY:Moderate         MEDIUM:None ___________________________________________________________________________________________               HISTORY:DOE                      Pre-op cardiovascular examination               REASON:Assess, LV function  INDICATION:R06.02 Shortness of breath ___________________________________________________________________________________________  ECHOCARDIOGRAPHIC MEASUREMENTS 2D DIMENSIONS AORTA             Values      Normal Range      MAIN PA           Values      Normal Range           Annulus:  nm*       [2.1 - 2.5]                PA Main:  nm*       [1.5 - 2.1]         Aorta Sin:  nm*       [2.7 - 3.3]       RIGHT VENTRICLE       ST Junction:  nm*       [2.3 - 2.9]                RV Base:  2.6 cm    [ < 4.2]         Asc.Aorta:  nm*       [2.3 - 3.1]                 RV Mid:  nm*       [ < 3.5]  LEFT VENTRICLE                                        RV Length:  nm*       [ < 8.6]             LVIDd:  4.5 cm    [3.9 - 5.3]       INFERIOR VENA CAVA             LVIDs:  3.1 cm                              Max. IVC:  nm*       [ <= 2.1]                FS:  31.5 %    [> 25]                    Min. IVC:  nm*               SWT:  0.86 cm   [0.5 - 0.9]                   ------------------               PWT:  0.73 cm   [0.5 - 0.9]                   nm* - not measured  LEFT ATRIUM           LA Diam:  3.3 cm    [2.7 - 3.8]       LA A4C Area:  nm*       [ < 20]         LA Volume:  nm*       [22 - 52] ___________________________________________________________________________________________  ECHOCARDIOGRAPHIC DESCRIPTIONS  AORTIC ROOT         Size:Normal   Dissection:INDETERM FOR DISSECTION  AORTIC VALVE  Leaflets:Tricuspid         Morphology:MILDLY THICKENED     Mobility:Fully mobile  LEFT VENTRICLE         Size:Normal              Anterior:Normal  Contraction:Normal               Lateral:Normal   Closest EF:>55% (Estimated)      Septal:Normal    LV Masses:No Masses             Apical:Normal          ZOX:WRUE                Inferior:Normal                                 Posterior:Normal Dias.FxClass:(Grade 1) relaxation abnormal, E/A reversal  MITRAL VALVE     Leaflets:Normal              Mobility:Fully mobile   Morphology:Normal  LEFT ATRIUM         Size:Normal             LA Masses:No masses    IA Septum:Normal IAS  MAIN PA         Size:Normal  PULMONIC VALVE   Morphology:Normal              Mobility:Fully mobile  RIGHT  VENTRICLE    RV Masses:No Masses               Size:Normal    Free Wall:Normal           Contraction:Normal  TRICUSPID VALVE     Leaflets:Normal              Mobility:Fully mobile   Morphology:Normal  RIGHT ATRIUM         Size:Normal              RA Other:None      RA Mass:No masses  PERICARDIUM        Fluid:No effusion  INFERIOR VENACAVA         Size:Normal Normal respiratory collapse  ____________________________________________________________________  DOPPLER ECHO and OTHER SPECIAL PROCEDURES    Aortic:No AR                         No AS           152.0 cm/sec peak vel         9.2 mmHg peak grad           5.0 mmHg mean grad            2.5 cm^2 by DOPPLER     Mitral:MILD MR                       No MS                                         3.0 cm^2 by DOPPLER           MV Inflow E Vel=82.9 cm/sec   MV Annulus E'Vel=7.4 cm/sec           E/E'Ratio=11.2  Tricuspid:MILD TR  No TS           222.0 cm/sec peak TR vel      24.7 mmHg peak RV pressure  Pulmonary:TRIVIAL PR                    No PS    ___________________________________________________________________________________________ INTERPRETATION NORMAL LEFT VENTRICULAR SYSTOLIC FUNCTION WITH AN ESTIMATED EF = 55 % NORMAL RIGHT VENTRICULAR SYSTOLIC FUNCTION MILD TRICUSPID AND MITRAL VALVE INSUFFICIENCY NO VALVULAR STENOSIS   ___________________________________________________________________________________________ Electronically signed by: MD Arnoldo Hooker on 04/12/2015 11:42 AM             Performed By: Mathis Bud, RVT       Ordering Physician: Arnoldo Hooker  ___________________________________________________________________________________________   Status Results Details   Encounter Summary  2016 January

## 2015-04-14 NOTE — Pre-Procedure Instructions (Signed)
2017 February NM myocardial perfusion SPECT multiple (stress and rest)04/12/2015  [facility]  NM myocardial perfusion SPECT multiple (stress and rest)04/12/2015  [facility]  Result Impression  Normal Lexiscan infusion EKG Normal myocardial perfusion without evidence of myocardial ischemia   Result Narrative  Procedure: Pharmacologic Myocardial Perfusion Imaging   ONE day procedure  Indication: Shortness of breath - Plan: NM myocardial perfusion SPECT  multiple (stress and rest), ECG stress test only  Abdominal aneurysm without mention of rupture - Plan: NM myocardial  perfusion SPECT multiple (stress and rest), ECG stress test only Ordering Physician:   Dr. Arnoldo Hooker   Clinical History: 82 y.o. year old female Vitals: Height: 60 in Weight: 133 lb Cardiac risk factors include:    PAD, Hyperlipidemia, HTN, Family Hx CAD and CAD    Procedure:  Pharmacologic stress testing was performed with Regadenoson using a single  use 0.4mg /67ml (0.08 mg/ml) prefilled syringe intravenously infused as a  bolus dose. The stress test was stopped due to Infusion completion.  Blood  pressure response was normal.    Rest HR: 78bpm Rest BP: 124/46mmHg Max HR: 102bpm Min BP: 140/29mmHg  Stress Test Administered by: Percell Boston, CMA  ECG Interpretation: Rest ECG:  normal sinus rhythm, none Stress ECG:  normal sinus rhythm,   Recovery ECG:  normal sinus rhythm ECG Interpretation:  non-diagnostic due to pharmacologic testing.   Administrations This Visit    regadenoson (LEXISCAN) 0.4 mg/5 mL inj syringe 0.4 mg    Admin Date Action Dose Route Administered By      04/12/2015 Given 0.4 mg Intravenous Loyal Gambler, CNMT            technetium Tc80m sestamibi (CARDIOLITE) injection 12.58 millicurie    Admin Date Action Dose Route Administered By      04/12/2015 Given 12.58 millicurie Intravenous Loyal Gambler, CNMT            technetium  Tc26m sestamibi (CARDIOLITE) injection 33.6 millicurie    Admin Date Action Dose Route Administered By      04/12/2015 Given 33.6 millicurie Intravenous Loyal Gambler, CNMT              Gated post-stress perfusion imaging was performed 30 minutes after stress.  Rest images were performed 30 minutes after injection.  Gated LV Analysis:  TID Ratio: 0.9  LVEF= 64%  FINDINGS: Regional wall motion:  reveals normal myocardial thickening and wall  motion. The overall quality of the study is fair.   Artifacts noted: no Left ventricular cavity: normal.  Perfusion Analysis:  SPECT images demonstrate homogeneous tracer  distribution throughout the myocardium.       CARDIOLOGY DEPARTMENT [facility] [id number] [facility] Acct #: [id number] [address] [place], [place] [address] Date: 04/12/2015 08:16 AM Adult Female Age: 82 yrs ECHOCARDIOGRAM REPORT Outpatient STUDY:CHEST WALL TAPE:0000:00: 0:00:00 KC::KCWC ECHO:Yes DOPPLER:Yes FILE:0000-000-000 MD1: KOWALSKI, BRUCE JAY COLOR:Yes CONTRAST:No MACHINE:Philips RV BIOPSY:No 3D:No SOUND QLTY:Moderate MEDIUM:None ___________________________________________________________________________________________  HISTORY:DOE Pre-op cardiovascular examination REASON:Assess, LV function INDICATION:R06.02 Shortness of breath ___________________________________________________________________________________________  ECHOCARDIOGRAPHIC MEASUREMENTS 2D DIMENSIONS AORTA Values Normal Range MAIN PA Values Normal Range Annulus: nm* [2.1 - 2.5] PA Main: nm* [1.5 - 2.1] Aorta Sin: nm* [2.7 - 3.3] RIGHT VENTRICLE ST Junction: nm* [2.3 - 2.9] RV Base: 2.6 cm [ < 4.2] Asc.Aorta: nm* [2.3 - 3.1] RV Mid: nm* [ < 3.5] LEFT VENTRICLE RV Length: nm* [ < 8.6] LVIDd: 4.5 cm [3.9 - 5.3]  INFERIOR VENA CAVA LVIDs: 3.1 cm Max. IVC: nm* [ <= 2.1] FS: 31.5 % [> 25] Min. IVC: nm* SWT: 0.86 cm [0.5 - 0.9]  ------------------ PWT: 0.73 cm [0.5 - 0.9] nm* - not measured LEFT ATRIUM LA Diam: 3.3 cm [2.7 - 3.8] LA A4C Area: nm* [ < 20] LA Volume: nm* [22 - 52] ___________________________________________________________________________________________  ECHOCARDIOGRAPHIC DESCRIPTIONS  AORTIC ROOT Size:Normal Dissection:INDETERM FOR DISSECTION  AORTIC VALVE Leaflets:Tricuspid Morphology:MILDLY THICKENED Mobility:Fully mobile  LEFT VENTRICLE Size:Normal Anterior:Normal Contraction:Normal Lateral:Normal Closest EF:>55% (Estimated) Septal:Normal LV Masses:No Masses Apical:Normal ZOX:WRUE Inferior:Normal Posterior:Normal Dias.FxClass:(Grade 1) relaxation abnormal, E/A reversal  MITRAL VALVE Leaflets:Normal Mobility:Fully mobile Morphology:Normal  LEFT ATRIUM Size:Normal LA Masses:No masses IA Septum:Normal IAS  MAIN PA Size:Normal  PULMONIC VALVE Morphology:Normal Mobility:Fully mobile  RIGHT VENTRICLE RV Masses:No Masses Size:Normal Free Wall:Normal Contraction:Normal  TRICUSPID VALVE Leaflets:Normal Mobility:Fully mobile Morphology:Normal  RIGHT ATRIUM Size:Normal RA Other:None RA Mass:No masses  PERICARDIUM Fluid:No effusion  INFERIOR VENACAVA Size:Normal Normal respiratory collapse  ____________________________________________________________________  DOPPLER ECHO and OTHER SPECIAL PROCEDURES Aortic:No AR No AS 152.0 cm/sec peak vel 9.2 mmHg peak grad 5.0 mmHg mean grad 2.5 cm^2 by DOPPLER  Mitral:MILD MR No MS 3.0 cm^2 by DOPPLER MV Inflow E Vel=82.9 cm/sec MV Annulus E'Vel=7.4 cm/sec E/E'Ratio=11.2  Tricuspid:MILD TR No TS 222.0 cm/sec peak TR vel 24.7 mmHg peak RV pressure  Pulmonary:TRIVIAL PR No PS    ___________________________________________________________________________________________ INTERPRETATION NORMAL LEFT VENTRICULAR SYSTOLIC FUNCTION WITH AN ESTIMATED EF = 55 % NORMAL RIGHT VENTRICULAR SYSTOLIC FUNCTION MILD TRICUSPID AND  MITRAL VALVE INSUFFICIENCY NO VALVULAR STENOSIS   ___________________________________________________________________________________________ Electronically signed by: MD Arnoldo Hooker on 04/12/2015 11:42 AM Performed By: Luretha Murphy, RDCS, RVT Ordering Physician: Arnoldo Hooker  ___________________________________________________________________________________________

## 2015-04-17 DIAGNOSIS — E7800 Pure hypercholesterolemia, unspecified: Secondary | ICD-10-CM | POA: Diagnosis not present

## 2015-04-17 DIAGNOSIS — I129 Hypertensive chronic kidney disease with stage 1 through stage 4 chronic kidney disease, or unspecified chronic kidney disease: Secondary | ICD-10-CM | POA: Diagnosis not present

## 2015-04-17 DIAGNOSIS — N183 Chronic kidney disease, stage 3 (moderate): Secondary | ICD-10-CM | POA: Diagnosis not present

## 2015-04-17 DIAGNOSIS — I2510 Atherosclerotic heart disease of native coronary artery without angina pectoris: Secondary | ICD-10-CM | POA: Diagnosis not present

## 2015-04-17 DIAGNOSIS — M1991 Primary osteoarthritis, unspecified site: Secondary | ICD-10-CM | POA: Diagnosis not present

## 2015-04-17 DIAGNOSIS — M4806 Spinal stenosis, lumbar region: Secondary | ICD-10-CM | POA: Diagnosis not present

## 2015-04-17 DIAGNOSIS — J449 Chronic obstructive pulmonary disease, unspecified: Secondary | ICD-10-CM | POA: Diagnosis not present

## 2015-04-17 DIAGNOSIS — J9611 Chronic respiratory failure with hypoxia: Secondary | ICD-10-CM | POA: Diagnosis not present

## 2015-04-17 DIAGNOSIS — J45909 Unspecified asthma, uncomplicated: Secondary | ICD-10-CM | POA: Diagnosis not present

## 2015-04-20 ENCOUNTER — Ambulatory Visit: Payer: Commercial Managed Care - HMO | Admitting: Anesthesiology

## 2015-04-20 ENCOUNTER — Encounter
Admission: RE | Disposition: A | Discharge status: Home / Self Care | Payer: Self-pay | Source: Ambulatory Visit | Attending: Vascular Surgery

## 2015-04-20 ENCOUNTER — Inpatient Hospital Stay
Admission: RE | Admit: 2015-04-20 | Discharge: 2015-04-23 | DRG: 272 | Disposition: A | Discharge status: Home / Self Care | Payer: Commercial Managed Care - HMO | Source: Ambulatory Visit | Attending: Vascular Surgery | Admitting: Vascular Surgery

## 2015-04-20 DIAGNOSIS — I2510 Atherosclerotic heart disease of native coronary artery without angina pectoris: Secondary | ICD-10-CM | POA: Diagnosis not present

## 2015-04-20 DIAGNOSIS — Z9981 Dependence on supplemental oxygen: Secondary | ICD-10-CM | POA: Diagnosis not present

## 2015-04-20 DIAGNOSIS — I714 Abdominal aortic aneurysm, without rupture: Secondary | ICD-10-CM | POA: Diagnosis not present

## 2015-04-20 DIAGNOSIS — Z87891 Personal history of nicotine dependence: Secondary | ICD-10-CM

## 2015-04-20 DIAGNOSIS — K219 Gastro-esophageal reflux disease without esophagitis: Secondary | ICD-10-CM | POA: Diagnosis present

## 2015-04-20 DIAGNOSIS — M79609 Pain in unspecified limb: Secondary | ICD-10-CM | POA: Diagnosis not present

## 2015-04-20 DIAGNOSIS — E7800 Pure hypercholesterolemia, unspecified: Secondary | ICD-10-CM | POA: Diagnosis not present

## 2015-04-20 DIAGNOSIS — I998 Other disorder of circulatory system: Secondary | ICD-10-CM | POA: Diagnosis not present

## 2015-04-20 DIAGNOSIS — M1990 Unspecified osteoarthritis, unspecified site: Secondary | ICD-10-CM | POA: Diagnosis not present

## 2015-04-20 DIAGNOSIS — Z8673 Personal history of transient ischemic attack (TIA), and cerebral infarction without residual deficits: Secondary | ICD-10-CM | POA: Diagnosis not present

## 2015-04-20 DIAGNOSIS — J45909 Unspecified asthma, uncomplicated: Secondary | ICD-10-CM | POA: Diagnosis present

## 2015-04-20 DIAGNOSIS — E785 Hyperlipidemia, unspecified: Secondary | ICD-10-CM | POA: Diagnosis not present

## 2015-04-20 DIAGNOSIS — J449 Chronic obstructive pulmonary disease, unspecified: Secondary | ICD-10-CM | POA: Diagnosis not present

## 2015-04-20 DIAGNOSIS — I708 Atherosclerosis of other arteries: Secondary | ICD-10-CM | POA: Diagnosis present

## 2015-04-20 DIAGNOSIS — M7989 Other specified soft tissue disorders: Secondary | ICD-10-CM | POA: Diagnosis not present

## 2015-04-20 DIAGNOSIS — I70213 Atherosclerosis of native arteries of extremities with intermittent claudication, bilateral legs: Secondary | ICD-10-CM | POA: Diagnosis not present

## 2015-04-20 DIAGNOSIS — I700 Atherosclerosis of aorta: Secondary | ICD-10-CM | POA: Diagnosis not present

## 2015-04-20 DIAGNOSIS — Z9049 Acquired absence of other specified parts of digestive tract: Secondary | ICD-10-CM

## 2015-04-20 DIAGNOSIS — I70223 Atherosclerosis of native arteries of extremities with rest pain, bilateral legs: Principal | ICD-10-CM | POA: Diagnosis present

## 2015-04-20 DIAGNOSIS — E039 Hypothyroidism, unspecified: Secondary | ICD-10-CM | POA: Diagnosis not present

## 2015-04-20 DIAGNOSIS — I350 Nonrheumatic aortic (valve) stenosis: Secondary | ICD-10-CM | POA: Diagnosis present

## 2015-04-20 DIAGNOSIS — I10 Essential (primary) hypertension: Secondary | ICD-10-CM | POA: Diagnosis present

## 2015-04-20 DIAGNOSIS — I739 Peripheral vascular disease, unspecified: Secondary | ICD-10-CM | POA: Diagnosis not present

## 2015-04-20 DIAGNOSIS — M810 Age-related osteoporosis without current pathological fracture: Secondary | ICD-10-CM | POA: Diagnosis present

## 2015-04-20 HISTORY — PX: PERIPHERAL VASCULAR CATHETERIZATION: SHX172C

## 2015-04-20 HISTORY — DX: Hypothyroidism, unspecified: E03.9

## 2015-04-20 LAB — CBC
HEMATOCRIT: 30.3 % — AB (ref 35.0–47.0)
HEMOGLOBIN: 10.1 g/dL — AB (ref 12.0–16.0)
MCH: 31 pg (ref 26.0–34.0)
MCHC: 33.4 g/dL (ref 32.0–36.0)
MCV: 93 fL (ref 80.0–100.0)
Platelets: 207 10*3/uL (ref 150–440)
RBC: 3.25 MIL/uL — ABNORMAL LOW (ref 3.80–5.20)
RDW: 13.6 % (ref 11.5–14.5)
WBC: 8.4 10*3/uL (ref 3.6–11.0)

## 2015-04-20 LAB — CREATININE, SERUM
Creatinine, Ser: 0.98 mg/dL (ref 0.44–1.00)
GFR calc Af Amer: 58 mL/min — ABNORMAL LOW (ref >60)
GFR, EST NON AFRICAN AMERICAN: 50 mL/min — AB (ref >60)

## 2015-04-20 LAB — GLUCOSE, CAPILLARY: Glucose-Capillary: 173 mg/dL — ABNORMAL HIGH (ref 65–99)

## 2015-04-20 LAB — MRSA PCR SCREENING: MRSA by PCR: NEGATIVE

## 2015-04-20 SURGERY — ENDOVASCULAR REPAIR/STENT GRAFT
Anesthesia: General | Laterality: Bilateral

## 2015-04-20 MED ORDER — ALUM & MAG HYDROXIDE-SIMETH 200-200-20 MG/5ML PO SUSP
15.0000 mL | ORAL | Status: DC | PRN
  Administered 2015-04-21: 30 mL via ORAL
  Filled 2015-04-20: qty 30

## 2015-04-20 MED ORDER — LACTATED RINGERS IV SOLN
INTRAVENOUS | Status: DC | PRN
  Administered 2015-04-20 (×2): via INTRAVENOUS

## 2015-04-20 MED ORDER — LABETALOL HCL 5 MG/ML IV SOLN
10.0000 mg | INTRAVENOUS | Status: DC | PRN
  Filled 2015-04-20: qty 4

## 2015-04-20 MED ORDER — FERROUS SULFATE 325 (65 FE) MG PO TABS
325.0000 mg | ORAL_TABLET | Freq: Every day | ORAL | Status: DC
  Administered 2015-04-21 – 2015-04-22 (×2): 325 mg via ORAL
  Filled 2015-04-20 (×2): qty 1

## 2015-04-20 MED ORDER — FENTANYL CITRATE (PF) 100 MCG/2ML IJ SOLN
INTRAMUSCULAR | Status: DC | PRN
  Administered 2015-04-20: 50 ug via INTRAVENOUS
  Administered 2015-04-20: 25 ug via INTRAVENOUS
  Administered 2015-04-20 (×3): 50 ug via INTRAVENOUS
  Administered 2015-04-20: 25 ug via INTRAVENOUS

## 2015-04-20 MED ORDER — IOHEXOL 300 MG/ML  SOLN
INTRAMUSCULAR | Status: DC | PRN
  Administered 2015-04-20: 75 mL via INTRA_ARTERIAL

## 2015-04-20 MED ORDER — SORBITOL 70 % SOLN
30.0000 mL | Freq: Every day | Status: DC | PRN
Start: 2015-04-20 — End: 2015-04-23
  Filled 2015-04-20: qty 30

## 2015-04-20 MED ORDER — TIOTROPIUM BROMIDE MONOHYDRATE 18 MCG IN CAPS
18.0000 ug | ORAL_CAPSULE | Freq: Every day | RESPIRATORY_TRACT | Status: DC
  Administered 2015-04-20 – 2015-04-22 (×3): 18 ug via RESPIRATORY_TRACT
  Filled 2015-04-20: qty 5

## 2015-04-20 MED ORDER — DEXMEDETOMIDINE HCL 200 MCG/2ML IV SOLN
INTRAVENOUS | Status: DC | PRN
  Administered 2015-04-20: 12 ug via INTRAVENOUS

## 2015-04-20 MED ORDER — ALBUTEROL SULFATE (2.5 MG/3ML) 0.083% IN NEBU
2.5000 mg | INHALATION_SOLUTION | Freq: Four times a day (QID) | RESPIRATORY_TRACT | Status: DC | PRN
Start: 2015-04-20 — End: 2015-04-20

## 2015-04-20 MED ORDER — HYDROCORTISONE NA SUCCINATE PF 100 MG IJ SOLR
INTRAMUSCULAR | Status: DC | PRN
Start: 2015-04-20 — End: 2015-04-20
  Administered 2015-04-20: 100 mg via INTRAVENOUS

## 2015-04-20 MED ORDER — OCUVITE-LUTEIN PO CAPS
1.0000 | ORAL_CAPSULE | Freq: Every day | ORAL | Status: DC
  Administered 2015-04-21 – 2015-04-22 (×2): 1 via ORAL
  Filled 2015-04-20 (×2): qty 1

## 2015-04-20 MED ORDER — PHENYLEPHRINE HCL 10 MG/ML IJ SOLN
INTRAMUSCULAR | Status: DC | PRN
  Administered 2015-04-20 (×2): 50 ug via INTRAVENOUS
  Administered 2015-04-20: 25 ug via INTRAVENOUS
  Administered 2015-04-20: 100 ug via INTRAVENOUS

## 2015-04-20 MED ORDER — CLINDAMYCIN PHOSPHATE 300 MG/50ML IV SOLN
300.0000 mg | Freq: Three times a day (TID) | INTRAVENOUS | Status: AC
  Administered 2015-04-20 (×2): 300 mg via INTRAVENOUS
  Filled 2015-04-20 (×2): qty 50

## 2015-04-20 MED ORDER — PANTOPRAZOLE SODIUM 40 MG PO TBEC
40.0000 mg | DELAYED_RELEASE_TABLET | Freq: Every day | ORAL | Status: DC
  Administered 2015-04-21 – 2015-04-23 (×3): 40 mg via ORAL
  Filled 2015-04-20 (×3): qty 1

## 2015-04-20 MED ORDER — GUAIFENESIN ER 600 MG PO TB12: 600.0000 mg | ORAL_TABLET | Freq: Two times a day (BID) | ORAL | Status: DC | PRN

## 2015-04-20 MED ORDER — POLYETHYLENE GLYCOL 3350 17 G PO PACK: 17.0000 g | PACK | Freq: Every day | ORAL | Status: DC | PRN

## 2015-04-20 MED ORDER — HYDROMORPHONE HCL 1 MG/ML IJ SOLN: 0.5000 mg | INTRAMUSCULAR | Status: DC | PRN

## 2015-04-20 MED ORDER — IBUPROFEN 200 MG PO TABS: 600.0000 mg | ORAL_TABLET | Freq: Four times a day (QID) | ORAL | Status: DC | PRN

## 2015-04-20 MED ORDER — ENOXAPARIN SODIUM 40 MG/0.4ML ~~LOC~~ SOLN
40.0000 mg | SUBCUTANEOUS | Status: DC
Start: 2015-04-21 — End: 2015-04-22
  Administered 2015-04-21 – 2015-04-22 (×2): 40 mg via SUBCUTANEOUS
  Filled 2015-04-20 (×2): qty 0.4

## 2015-04-20 MED ORDER — NEOSTIGMINE METHYLSULFATE 10 MG/10ML IV SOLN
INTRAVENOUS | Status: DC | PRN
  Administered 2015-04-20: 4 mg via INTRAVENOUS

## 2015-04-20 MED ORDER — DM-GUAIFENESIN ER 30-600 MG PO TB12
1.0000 | ORAL_TABLET | Freq: Two times a day (BID) | ORAL | Status: DC | PRN
  Filled 2015-04-20: qty 1

## 2015-04-20 MED ORDER — DEXTROMETHORPHAN POLISTIREX ER 30 MG/5ML PO SUER
30.0000 mg | Freq: Two times a day (BID) | ORAL | Status: DC | PRN
  Filled 2015-04-20: qty 5

## 2015-04-20 MED ORDER — LACTATED RINGERS IV SOLN
INTRAVENOUS | Status: DC
  Administered 2015-04-20: 07:00:00 via INTRAVENOUS

## 2015-04-20 MED ORDER — ONDANSETRON HCL 4 MG/2ML IJ SOLN: 4.0000 mg | Freq: Four times a day (QID) | INTRAMUSCULAR | Status: DC | PRN

## 2015-04-20 MED ORDER — ALBUTEROL SULFATE (2.5 MG/3ML) 0.083% IN NEBU
2.5000 mg | INHALATION_SOLUTION | RESPIRATORY_TRACT | Status: DC
  Administered 2015-04-20 – 2015-04-21 (×4): 2.5 mg via RESPIRATORY_TRACT
  Filled 2015-04-20 (×5): qty 3

## 2015-04-20 MED ORDER — DOCUSATE SODIUM 100 MG PO CAPS
100.0000 mg | ORAL_CAPSULE | Freq: Two times a day (BID) | ORAL | Status: DC
  Administered 2015-04-20 – 2015-04-23 (×7): 100 mg via ORAL
  Filled 2015-04-20 (×7): qty 1

## 2015-04-20 MED ORDER — ACETAMINOPHEN 650 MG RE SUPP: 325.0000 mg | RECTAL | Status: DC | PRN

## 2015-04-20 MED ORDER — LEVOTHYROXINE SODIUM 75 MCG PO TABS
75.0000 ug | ORAL_TABLET | Freq: Every day | ORAL | Status: DC
  Administered 2015-04-21 – 2015-04-23 (×3): 75 ug via ORAL
  Filled 2015-04-20 (×3): qty 1

## 2015-04-20 MED ORDER — MAGNESIUM OXIDE 400 (241.3 MG) MG PO TABS
400.0000 mg | ORAL_TABLET | Freq: Every day | ORAL | Status: DC
  Administered 2015-04-21 – 2015-04-22 (×2): 400 mg via ORAL
  Filled 2015-04-20 (×2): qty 1

## 2015-04-20 MED ORDER — PANTOPRAZOLE SODIUM 20 MG PO TBEC
20.0000 mg | DELAYED_RELEASE_TABLET | Freq: Every day | ORAL | Status: DC
  Filled 2015-04-20: qty 1

## 2015-04-20 MED ORDER — METOPROLOL TARTRATE 1 MG/ML IV SOLN
5.0000 mg | Freq: Four times a day (QID) | INTRAVENOUS | Status: DC
  Administered 2015-04-21 – 2015-04-22 (×4): 5 mg via INTRAVENOUS
  Filled 2015-04-20 (×4): qty 5

## 2015-04-20 MED ORDER — ONDANSETRON HCL 4 MG/2ML IJ SOLN
INTRAMUSCULAR | Status: DC | PRN
  Administered 2015-04-20: 4 mg via INTRAVENOUS

## 2015-04-20 MED ORDER — LORATADINE 10 MG PO TABS
10.0000 mg | ORAL_TABLET | Freq: Every day | ORAL | Status: DC
  Administered 2015-04-21 – 2015-04-23 (×3): 10 mg via ORAL
  Filled 2015-04-20 (×3): qty 1

## 2015-04-20 MED ORDER — HYDRALAZINE HCL 20 MG/ML IJ SOLN: 5.0000 mg | INTRAMUSCULAR | Status: DC | PRN

## 2015-04-20 MED ORDER — MAGNESIUM CITRATE PO SOLN: 1.0000 | Freq: Once | ORAL | Status: DC | PRN

## 2015-04-20 MED ORDER — POTASSIUM CHLORIDE IN NACL 20-0.9 MEQ/L-% IV SOLN
INTRAVENOUS | Status: DC
  Administered 2015-04-20: 75 mL/h via INTRAVENOUS
  Administered 2015-04-21 – 2015-04-22 (×2): via INTRAVENOUS
  Filled 2015-04-20 (×6): qty 1000

## 2015-04-20 MED ORDER — CEFAZOLIN SODIUM-DEXTROSE 2-3 GM-% IV SOLR
2.0000 g | INTRAVENOUS | Status: AC
  Administered 2015-04-20: 2 g via INTRAVENOUS
  Filled 2015-04-20: qty 50

## 2015-04-20 MED ORDER — NITROGLYCERIN 0.4 MG SL SUBL: 0.4000 mg | SUBLINGUAL_TABLET | SUBLINGUAL | Status: DC | PRN

## 2015-04-20 MED ORDER — HEPARIN SODIUM (PORCINE) 1000 UNIT/ML IJ SOLN
INTRAMUSCULAR | Status: DC | PRN
  Administered 2015-04-20: 2 mL via INTRAVENOUS
  Administered 2015-04-20: 6 mL via INTRAVENOUS

## 2015-04-20 MED ORDER — ESMOLOL HCL 100 MG/10ML IV SOLN
INTRAVENOUS | Status: DC | PRN
  Administered 2015-04-20: 20 mg via INTRAVENOUS
  Administered 2015-04-20 (×3): 10 mg via INTRAVENOUS

## 2015-04-20 MED ORDER — ISOSORBIDE MONONITRATE ER 30 MG PO TB24
30.0000 mg | ORAL_TABLET | Freq: Every day | ORAL | Status: DC
  Administered 2015-04-21 – 2015-04-22 (×2): 30 mg via ORAL
  Filled 2015-04-20 (×2): qty 1

## 2015-04-20 MED ORDER — LIDOCAINE HCL (CARDIAC) 20 MG/ML IV SOLN
INTRAVENOUS | Status: DC | PRN
  Administered 2015-04-20: 60 mg via INTRAVENOUS

## 2015-04-20 MED ORDER — BUDESONIDE 0.5 MG/2ML IN SUSP
0.5000 mg | Freq: Two times a day (BID) | RESPIRATORY_TRACT | Status: DC
  Administered 2015-04-20 – 2015-04-23 (×6): 0.5 mg via RESPIRATORY_TRACT
  Filled 2015-04-20 (×6): qty 2

## 2015-04-20 MED ORDER — ETOMIDATE 2 MG/ML IV SOLN
INTRAVENOUS | Status: DC | PRN
  Administered 2015-04-20: 12 mg via INTRAVENOUS

## 2015-04-20 MED ORDER — MOMETASONE FURO-FORMOTEROL FUM 100-5 MCG/ACT IN AERO
2.0000 | INHALATION_SPRAY | Freq: Two times a day (BID) | RESPIRATORY_TRACT | Status: DC
  Administered 2015-04-20 – 2015-04-23 (×6): 2 via RESPIRATORY_TRACT
  Filled 2015-04-20: qty 8.8

## 2015-04-20 MED ORDER — ALBUTEROL SULFATE HFA 108 (90 BASE) MCG/ACT IN AERS: 2.0000 | INHALATION_SPRAY | Freq: Four times a day (QID) | RESPIRATORY_TRACT | Status: DC | PRN

## 2015-04-20 MED ORDER — IPRATROPIUM BROMIDE 0.02 % IN SOLN: 0.5000 mg | Freq: Four times a day (QID) | RESPIRATORY_TRACT | Status: DC | PRN

## 2015-04-20 MED ORDER — VITAMIN C 500 MG PO TABS
500.0000 mg | ORAL_TABLET | Freq: Every day | ORAL | Status: DC
  Administered 2015-04-21 – 2015-04-22 (×2): 500 mg via ORAL
  Filled 2015-04-20 (×2): qty 1

## 2015-04-20 MED ORDER — VITAMIN D 1000 UNITS PO TABS
1000.0000 [IU] | ORAL_TABLET | Freq: Every day | ORAL | Status: DC
  Administered 2015-04-21 – 2015-04-22 (×2): 1000 [IU] via ORAL
  Filled 2015-04-20 (×3): qty 1

## 2015-04-20 MED ORDER — ONDANSETRON HCL 4 MG/2ML IJ SOLN: 4.0000 mg | Freq: Once | INTRAMUSCULAR | Status: DC | PRN

## 2015-04-20 MED ORDER — GLYCOPYRROLATE 0.2 MG/ML IJ SOLN
INTRAMUSCULAR | Status: DC | PRN
  Administered 2015-04-20: 0.6 mg via INTRAVENOUS

## 2015-04-20 MED ORDER — FENTANYL CITRATE (PF) 100 MCG/2ML IJ SOLN
25.0000 ug | INTRAMUSCULAR | Status: DC | PRN
  Administered 2015-04-20: 50 ug via INTRAVENOUS

## 2015-04-20 MED ORDER — ROCURONIUM BROMIDE 100 MG/10ML IV SOLN
INTRAVENOUS | Status: DC | PRN
  Administered 2015-04-20: 10 mg via INTRAVENOUS
  Administered 2015-04-20: 5 mg via INTRAVENOUS
  Administered 2015-04-20: 35 mg via INTRAVENOUS
  Administered 2015-04-20 (×5): 10 mg via INTRAVENOUS

## 2015-04-20 MED ORDER — KETOROLAC TROMETHAMINE 10 MG PO TABS
10.0000 mg | ORAL_TABLET | Freq: Three times a day (TID) | ORAL | Status: DC
  Administered 2015-04-20 – 2015-04-22 (×7): 10 mg via ORAL
  Filled 2015-04-20 (×9): qty 1

## 2015-04-20 MED ORDER — ASPIRIN EC 81 MG PO TBEC
81.0000 mg | DELAYED_RELEASE_TABLET | Freq: Every day | ORAL | Status: DC
  Administered 2015-04-20 – 2015-04-22 (×3): 81 mg via ORAL
  Filled 2015-04-20 (×3): qty 1

## 2015-04-20 MED ORDER — ACETAMINOPHEN 325 MG PO TABS: 325.0000 mg | ORAL_TABLET | ORAL | Status: DC | PRN

## 2015-04-20 MED ORDER — PREDNISONE 10 MG PO TABS
10.0000 mg | ORAL_TABLET | Freq: Every day | ORAL | Status: DC
  Administered 2015-04-21 – 2015-04-23 (×3): 10 mg via ORAL
  Filled 2015-04-20 (×3): qty 1

## 2015-04-20 MED ORDER — FLUTICASONE FUROATE-VILANTEROL 200-25 MCG/INH IN AEPB
1.0000 | INHALATION_SPRAY | Freq: Every day | RESPIRATORY_TRACT | Status: DC
Start: 2015-04-21 — End: 2015-04-20

## 2015-04-20 SURGICAL SUPPLY — 63 items
BALLN ULTRV 018 6X40X75 (BALLOONS) ×3
BALLN ULTRVRSE 7X40X75C (BALLOONS) ×6
BALLN ULTRVRSE 8X40X75C (BALLOONS) ×6
BALLOON ULTRV 018 6X40X75 (BALLOONS) ×1 IMPLANT
BALLOON ULTRVRSE 7X40X75C (BALLOONS) ×2 IMPLANT
BALLOON ULTRVRSE 8X40X75C (BALLOONS) ×2 IMPLANT
BLADE SURG 15 STRL LF DISP TIS (BLADE) ×1 IMPLANT
BLADE SURG 15 STRL SS (BLADE) ×2
BOOT SUTURE AID YELLOW STND (SUTURE) ×3 IMPLANT
BRUSH SCRUB 4% CHG (MISCELLANEOUS) ×3 IMPLANT
CATH ACCU-VU SIZ PIG 5F 70CM (CATHETERS) ×3 IMPLANT
CATH KA2 5FR 65CM (CATHETERS) ×3 IMPLANT
DEVICE PRESTO INFLATION (MISCELLANEOUS) ×6 IMPLANT
DRAPE INCISE IOBAN 66X45 STRL (DRAPES) ×3 IMPLANT
DRESSING SURGICEL FIBRLLR 1X2 (HEMOSTASIS) ×1 IMPLANT
DRSG SURGICEL FIBRILLAR 1X2 (HEMOSTASIS) ×3
ELECT CAUTERY BLADE 6.4 (BLADE) ×6 IMPLANT
ELECT REM PT RETURN 9FT ADLT (ELECTROSURGICAL) ×3
ELECTRODE REM PT RTRN 9FT ADLT (ELECTROSURGICAL) ×1 IMPLANT
EVICEL 5ML SEALNT HUMAN B (Miscellaneous) ×3 IMPLANT
GLOVE BIO SURGEON STRL SZ7 (GLOVE) ×3 IMPLANT
GLOVE SURG SYN 8.0 (GLOVE) ×3 IMPLANT
GOWN STRL REUS W/ TWL LRG LVL3 (GOWN DISPOSABLE) ×2 IMPLANT
GOWN STRL REUS W/ TWL XL LVL3 (GOWN DISPOSABLE) ×2 IMPLANT
GOWN STRL REUS W/TWL LRG LVL3 (GOWN DISPOSABLE) ×4
GOWN STRL REUS W/TWL XL LVL3 (GOWN DISPOSABLE) ×4
IV NS 1000ML (IV SOLUTION) ×2
IV NS 1000ML BAXH (IV SOLUTION) ×1 IMPLANT
LIQUID BAND (GAUZE/BANDAGES/DRESSINGS) ×3 IMPLANT
LOOP RED MAXI  1X406MM (MISCELLANEOUS) ×8
LOOP VESSEL MAXI 1X406 RED (MISCELLANEOUS) ×4 IMPLANT
LOOP VESSEL MINI 0.8X406 BLUE (MISCELLANEOUS) ×3 IMPLANT
LOOPS BLUE MINI 0.8X406MM (MISCELLANEOUS) ×6
PACK ANGIOGRAPHY (CUSTOM PROCEDURE TRAY) ×3 IMPLANT
PACK BASIN MAJOR ARMC (MISCELLANEOUS) ×3 IMPLANT
PATCH CAROTID ECM VASC 1X10 (Prosthesis & Implant Heart) ×6 IMPLANT
PENCIL ELECTRO HAND CTR (MISCELLANEOUS) ×3 IMPLANT
PREP CHG 10.5 TEAL (MISCELLANEOUS) ×3 IMPLANT
SHEATH BRITE TIP 7FRX11 (SHEATH) ×3 IMPLANT
SHEATH BRITE TIP 8FRX11 (SHEATH) ×6 IMPLANT
STENT VIABAHN 8X7.5X120 (Permanent Stent) ×6 IMPLANT
STENT VIABAHN VBX 11X39X135 (Permanent Stent) ×3 IMPLANT
SUT MNCRL 4-0 (SUTURE) ×2
SUT MNCRL 4-0 27XMFL (SUTURE) ×1
SUT PROLENE 6 0 BV (SUTURE) ×75 IMPLANT
SUT SILK 2 0 (SUTURE) ×2
SUT SILK 2-0 18XBRD TIE 12 (SUTURE) ×1 IMPLANT
SUT SILK 3 0 (SUTURE) ×2
SUT SILK 3-0 18XBRD TIE 12 (SUTURE) ×1 IMPLANT
SUT SILK 4 0 (SUTURE) ×2
SUT SILK 4-0 18XBRD TIE 12 (SUTURE) ×1 IMPLANT
SUT VIC AB 2-0 CT1 (SUTURE) ×6 IMPLANT
SUT VICRYL+ 3-0 36IN CT-1 (SUTURE) ×6 IMPLANT
SUTURE MNCRL 4-0 27XMF (SUTURE) ×1 IMPLANT
SYR 20CC LL (SYRINGE) ×3 IMPLANT
SYR MEDRAD MARK V 150ML (SYRINGE) ×3 IMPLANT
SYRINGE 10CC LL (SYRINGE) ×3 IMPLANT
TOWEL OR 17X26 4PK STRL BLUE (TOWEL DISPOSABLE) ×6 IMPLANT
TUBING CONTRAST HIGH PRESS 72 (TUBING) ×3 IMPLANT
WIRE G 018X200 V18 (WIRE) ×3 IMPLANT
WIRE G V18X300CM (WIRE) ×6 IMPLANT
WIRE J 3MM .035X145CM (WIRE) ×3 IMPLANT
WIRE MAGIC TORQUE 260C (WIRE) ×6 IMPLANT

## 2015-04-20 NOTE — Op Note (Signed)
[facility]  Percutaneous Study/Intervention Procedural Note   Date of Surgery: 04/20/2015,1:48 PM  Surgeon:Lavanya Roa, Dolores Lory   Pre-operative Diagnosis: Atherosclerotic occlusive disease with rest pain bilateral lower extremities; stricture of abdominal aorta; critical stenosis of the bilateral iliac and femoral arteries  Post-operative diagnosis:  Same  Procedure(s) Performed:  1.  Bilateral open common endarterectomies with core matrix patch angioplasty femoral  2.  Bilateral open profunda femoris endarterectomies with core matrix patch angioplasty  3.  Open angioplasty and stent placement of the abdominal aorta using a 11 x 39 VBX dilated to 16 mm  4.  Open and plasty and stent placement of the right common iliac artery using an 8 x 7.5 Viabahn  5.  Open angioplasty and stent placement of the left common iliac artery using an 8 x 7.5 Viabahn  6.  Open angioplasty of the external iliac artery on the left to 6 mm   Anesthesia: Gen. anesthesia was performed  Sheath: 8 French right common femoral; 7 French left common femoral  Contrast: 75 cc   Fluoroscopy Time: 10 minutes  Indications:  The patient presented many months ago because of increasing pain in her feet particularly at rest at night which was preventing her from sleeping. At that time angiography was performed and it was identified that her pattern of stenosis is not amenable to intervention. However, she has known severe pulmonary disease and felt that her pulmonary risk was still too high so she did not wish to pursue a hybrid operation. However, over the past few months her pain has become so severe and she is so miserable that she is now wishing to proceed with a anesthesia so that bilateral femoral endarterectomies can be performed and intervention of the abdominal aorta and iliac arteries can be achieved the same time. The risks and benefits of been reviewed all questions are answered patient agrees  to proceed.  Procedure:  Cynthia [name] Cynthia [name] a 82 y.o. female who was identified and appropriate procedural time out was performed.  The patient was then placed supine on the table and prepped and draped in the usual sterile fashion.  With Dr. Lucky Cowboy working on the patient's left side and myself working on the patient's right vertical incisions were made in both groins and carried down through the soft tissues opening the femoral sheath exposing the common femoral artery. Dissections were then carried proximally and distally bilaterally. The circumflex vessels were isolated and the distal external iliac artery was looped on bilaterally. The SFA and profunda were then addressed again dissecting them circumferentially and looping them with Silastic Vesseloops the profunda femoris was dissected down to a second-order branches.  5000 units of heparin was given. Beginning on the left side the distal external iliac followed by the SFA then the profunda femoris arteries were clamped arteriotomy was made with a blade and extended with Potts scissors. A huge amount of bulky calcified plaque was identified and endarterectomy was then performed beginning in the proximal common femoral and extending down into the first centimeter of the profunda femoris. After obtaining a reasonable edge multiple interrupted 6-0 Prolene sutures were used to tack the distal intimal endpoint in the profunda femoris. Cor matrix patch was then delivered onto the field beveled and beginning in the profunda femoris the cor matrix patch was applied to repair the arterial defect using 6-0 Prolene in a running fashion. Flushing maneuvers were performed and flow was established first to the profunda femoris and then the superficial  femoral. Several bleeding points along the suture line were controlled with interrupted 6-0 Prolene sutures. The wound was packed with saline moistened gauze.  In a similar fashion attention was now turned to the right common  femoral which was clamped the distal external as well as the SFA and the profunda branches. Arteriotomy was made with 11 blade and extended with Potts scissors down onto the profunda and endarterectomy was performed beginning in the proximal common femoral and extending down into the profunda femoris by approximately 1 cm. Distal intimal edge was obtained proximal intimal edge was also fairly clean. 6-0 Prolene interrupted sutures were used to tack the intimal edge endpoint in the profunda femoris. Cor matrix patch was then beveled and applied beginning in the profunda femoris and extending it to the proximal common femoral using running 6-0 Prolene. Flushing maneuvers were performed and flow was reestablished first to the profunda femoris then the superficial femoral artery. Several bleeding points were controlled with interrupted 6-0 Prolene sutures. A moistened saline gauze was then placed.  Having successfully completed the open endarterectomies bilaterally attention is now turned to the proximal aorta iliac disease. Under direct visualization the common femoral arteries bilaterally are punctured with Seldinger needles with Dr. Lucky Cowboy working on the left side and myself on the right J-wire is advanced a 7 French sheath is placed on the left and 8 French sheath is placed on the right. Wires were then advanced into the descending aorta under fluoroscopic guidance and a catheter is with marker pigtail is placed. Hand injection contrast is then used and the aortic stenosis is localized. Several oblique images are obtained in magnified imaging and a 11 x 39 VBX Gore stent is selected advanced through the 8 French sheath and positioned across the stricture. It is inflated to 14 atm. Subsequently the wire and catheter from the left are used to cannulate the stent verification is made with twirling a pigtail within the stent and then a Magic torque wire was advanced and positioned in the distal thoracic aorta initially 7  x 4 ultra versed balloons and then 8 x 4 ultra versed balloons were advanced up both the right and left wires positioned across the stent and inflated simultaneously to 12 atm. It should be noted that the 7 mm balloon was used to angioplasty the entire to the left common iliac before completely removing it. Follow-up imaging demonstrated an excellent result with full expansion of the stent and good apposition to the wall.  Pigtail catheter was then reintroduced and oblique views first and an RAO projection then and LAO projection were performed to localize the origins of the internal iliac arteries. Two 8 x 7.5 Viabahn stents were then selected the 035 wires were exchanged via a catheter to 0.18 V18 wires and the Viabahn stents positioned so that the entire left common iliac was covered the right stent was then used to match the left at its proximal or leading edge and both stents were deployed and postdilated with 8 mm balloons.  Pigtail catheter was reintroduced and AP projection of the aorta iliacs was obtained. This appeared to have excellent stent apposition with full expansion and rapid flow of contrast much improved compared to the pre-stenting study. Therefore a LAO projection of the right external iliac was obtained which demonstrated the right external iliac to be free of hemodynamically significant stenosis. RAO projection of the right femoral was then obtained demonstrating the endarterectomy to be widely patent with patency of the origin of the SFA  and profunda.  The table was then repositioned and with the detectors still in the RAO projection the left external iliac was imaged demonstrating a lesion in the distal external iliac this was imaged even better in the LAO projection of the left groin and subsequent the sheath was repositioned and a 6 x 4 balloon was advanced across the lesion inflated to 12 atm for 1 minute. Follow-up reimaging demonstrated complete resolution of the stricture.  Otherwise the left femoral endarterectomy the profunda femoris and SFA appeared widely patent.  The sheaths were then pulled and pursestring sutures a 6-0 Prolene were placed the wounds were then irrigated AdvaSeal and fibrillar were then placed along the artery and subsequently both incisions were closed in multiple layers using 2-0 Vicryl followed by 3-0 Vicryl followed by 4-0 Monocryl subcuticular and then Dermabond.  The patient tolerated the procedure well there were no pulmonary issues no ventilation problems she extubated immediately and seemed to tolerate the procedure quite well.  Findings:   Aortogram:  Moderate ectasia is noted of the renals with a maximum diameter 2.6 cm. 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 There is diffuse disease on the right there is severe greater than 90% disease on the left common iliac. Following angioplasty and stent placement as described above there is complete resolution of these lesions with minimal if any residual stenosis. Following and plasty left external there is less than 5% residual stenosis. Imaging of the endarterectomy site bilaterally demonstrates wide patency with good distal flow.   Disposition: Patient was taken to the recovery room in stable condition having tolerated the procedure well.  Goku Harb, Dolores Lory 04/20/2015,1:48 PM

## 2015-04-20 NOTE — Anesthesia Preprocedure Evaluation (Addendum)
Anesthesia Evaluation  Patient identified by MRN, date of birth, ID band  Airway Mallampati: II  TM Distance: >3 FB Neck ROM: Limited    Dental  (+) Edentulous Upper, Edentulous Lower   Pulmonary shortness of breath and with exertion, asthma , pneumonia, resolved, COPD,  COPD inhaler and oxygen dependent, former smoker,     + decreased breath sounds      Cardiovascular Exercise Tolerance: Poor hypertension, Pt. on medications + CAD and + Peripheral Vascular Disease  Normal cardiovascular exam     Neuro/Psych CVA, No Residual Symptoms    GI/Hepatic GERD  Medicated and Controlled,  Endo/Other  Hypothyroidism   Renal/GU Renal InsufficiencyRenal disease     Musculoskeletal  (+) Arthritis , Osteoarthritis,    Abdominal Normal abdominal exam  (+)   Peds  Hematology   Anesthesia Other Findings   Reproductive/Obstetrics                            Anesthesia Physical Anesthesia Plan  ASA: IV  Anesthesia Plan: General   Post-op Pain Management:    Induction: Intravenous  Airway Management Planned: Oral ETT  Additional Equipment: Arterial line  Intra-op Plan:   Post-operative Plan: Extubation in OR and Post-operative intubation/ventilation  Informed Consent: I have reviewed the patients History and Physical, chart, labs and discussed the procedure including the risks, benefits and alternatives for the proposed anesthesia with the patient or authorized representative who has indicated his/her understanding and acceptance.   Consent reviewed with POA  Plan Discussed with: CRNA  Anesthesia Plan Comments:         Anesthesia Quick Evaluation

## 2015-04-20 NOTE — Transfer of Care (Signed)
Immediate Anesthesia Transfer of Care Note  Patient: Cynthia [name]  Procedure(s) Performed: Procedure(s): Endovascular Repair/Stent Graft (Bilateral)  Patient Location: PACU  Anesthesia Type:General  Level of Consciousness: awake, alert , pateint uncooperative, confused and responds to stimulation  Airway & Oxygen Therapy: Patient Spontanous Breathing and Patient connected to face mask oxygen  Post-op Assessment: Report given to RN and Post -op Vital signs reviewed and stable  Post vital signs: Reviewed and stable  Last Vitals:  Filed Vitals:   04/20/15 1208 04/20/15 1209  BP: 122/57   Pulse: 62 62  Temp: 36.5 C   Resp: 11 14    Complications: No apparent anesthesia complications

## 2015-04-20 NOTE — Anesthesia Postprocedure Evaluation (Signed)
Anesthesia Post Note  Patient: Cynthia PughDaisy W [name]  Procedure(s) Performed: Procedure(s) (LRB): Endovascular Repair/Stent Graft (Bilateral)  Patient location during evaluation: PACU Anesthesia Type: General Level of consciousness: awake Pain management: pain level controlled Vital Signs Assessment: post-procedure vital signs reviewed and stable Respiratory status: spontaneous breathing Cardiovascular status: blood pressure returned to baseline Postop Assessment: no headache Anesthetic complications: no    Last Vitals:  Filed Vitals:   04/20/15 1208 04/20/15 1209  BP: 122/57   Pulse: 62 62  Temp: 36.5 C   Resp: 11 14    Last Pain: There were no vitals filed for this visit.               Anayely Constantine M

## 2015-04-20 NOTE — Progress Notes (Signed)
[facility]  Daily Progress Note   Subjective  - night of Surgery  Patient is sitting in bed alert when asked about pain she notes some pain in her groin areas but her feet no longer hurting whatsoever. She denies shortness of breath or difficulty breathing states that she is feeling quite comfortable  Objective Filed Vitals:   04/20/15 1900 04/20/15 1915 04/20/15 1930 04/20/15 2009  BP: 84/50 96/63    Pulse: 58     Temp:   97.9 F (36.6 C)   TempSrc:   Oral   Resp: 14     Height:      Weight:      SpO2: 98%   98%    Intake/Output Summary (Last 24 hours) at 04/20/15 2029 Last data filed at 04/20/15 1600  Gross per 24 hour  Intake   1520 ml  Output    585 ml  Net    935 ml    PULM  Normal effort , no use of accessory muscles CV  No JVD, RRR Abd      No distended, nontender VASC  incisions clean dry and intact. Feet are pink warm and hyperemic  Laboratory CBC    Component Value Date/Time   WBC 8.4 04/20/2015 1410   WBC 8.6 11/15/2014 1242   WBC 16.0* 01/27/2014 1716   HGB 10.1* 04/20/2015 1410   HGB 11.7* 01/27/2014 1716   HCT 30.3* 04/20/2015 1410   HCT 40.9 11/15/2014 1242   HCT 36.5 01/27/2014 1716   PLT 207 04/20/2015 1410   PLT 336 11/15/2014 1242   PLT 198 01/27/2014 1716    BMET    Component Value Date/Time   NA 141 04/11/2015 1144   NA 142 11/15/2014 1242   NA 132* 01/27/2014 1716   K 4.2 04/11/2015 1144   K 4.2 01/27/2014 1716   CL 104 04/11/2015 1144   CL 98 01/27/2014 1716   CO2 28 04/11/2015 1144   CO2 25 01/27/2014 1716   GLUCOSE 94 04/11/2015 1144   GLUCOSE 107* 11/15/2014 1242   GLUCOSE 126* 01/27/2014 1716   BUN 17 04/11/2015 1144   BUN 16 11/15/2014 1242   BUN 24* 01/27/2014 1716   CREATININE 0.98 04/20/2015 1410   CREATININE 1.48* 01/27/2014 1716   CALCIUM 9.1 04/11/2015 1144   CALCIUM 8.9 01/27/2014 1716   GFRNONAA 50* 04/20/2015 1410   GFRNONAA 36* 01/27/2014 1716   GFRNONAA 44* 07/15/2013 1435   GFRAA 58* 04/20/2015 1410   GFRAA 43* 01/27/2014 1716   GFRAA 51* 07/15/2013 1435    Assessment/Planning:   Status post aorto iliac stenting with bilateral femoral endarterectomies  Patient is doing well no changes are present will plan for early mobilization tomorrow    Renford DillsSchnier, Gregory G  04/20/2015, 8:29 PM

## 2015-04-20 NOTE — Op Note (Signed)
OPERATIVE NOTE   PROCEDURE: 1. Left common femoral and profunda femoris endarterectomies 2.   Right common femoral and profunda femoris endarterectomies   PRE-OPERATIVE DIAGNOSIS: 1.Atherosclerotic occlusive disease bilateral lower extremities with rest pain 2. Aortic atherosclerosis 3. COPD precluding major abdominal incision  POST-OPERATIVE DIAGNOSIS: Same  SURGEON: Katyra Tomassetti, MD  CO-surgeon: Dr. Katha Cabal, MD  ANESTHESIA: general  ESTIMATED BLOOD LOSS: 100 cc  FINDING(S): 1. significant plaque in bilateral common femoral and profunda femoris arteries 2.  Apple core lesion in the distal aorta 3.  Moderate right common iliac artery stenosis and high grade left iliac artery stenosis 4.  Moderate distal left external iliac artery stenosis just above the endarterectomy plane  SPECIMEN(S): Bilateral common femoral and profunda femoris artery plaque.  INDICATIONS:  Patient presents with ischemic rest pain of both lower extremities. She has severe aortoiliac disease with a napkin ring like lesion in the distal infrarenal aorta and at least moderate right common iliac artery stenosis and severe left common iliac artery stenosis limiting her inflow. She has severe COPD which precludes her from having an abdominal incision to treat this with an aortobifemoral bypass, so a complex endovascular reconstruction of her aorta and iliac segments is planned.  Bilateral femoral endarterectomies are also planned to try to improve perfusion. The risks and benefits as well as alternative therapies including intervention were reviewed in detail all questions were answered the patient agrees to proceed with surgery.  DESCRIPTION: After obtaining full informed written consent, the patient was brought back to the operating room and placed supine upon the operating table. The patient received IV antibiotics prior to induction. After obtaining adequate anesthesia, the patient was prepped  and draped in the standard fashion appropriate time out is called.   With myself working on the left and Dr. Delana Meyer working on the right we began by dissecting out the femoral arteries on each side. Vertical incisions were created overlying both femoral arteries. The common femoral artery proximally, and superficial femoral artery, and primary profunda femoris artery branches were encircled with vessel loops and prepared for control. Both femoral arteries were found to have significant plaque from the common femoral artery into the profunda and superficial femoral arteries.   5000 units of heparin was given and allowed circulate for 5 minutes. Later on, an additional 2000 units of intravenous heparin were also given. We elected to perform the endarterectomies first, and then have clear intraluminal access for placement of our sheaths for the endovascular aortoiliac reconstruction.  Attention is then turned to the left femoral artery. An arteriotomy is made with 11 blade and extended with Potts scissors in the common femoral artery and carried down onto the first 1-2 cm of the profunda femoris artery. An endarterectomy was then performed. The [facility] was used to create a plane. The proximal endpoint was cut flush with tenotomy scissors. This was in the proximal common femoral artery. The distal endpoint of the profunda femoris endarterectomy was created with gentle traction and the distal endpoint was reasonably clean. 2 6-0 Prolene tacking sutures were used to tack down the distal endpoint of the profunda femoris artery. The Cormatrix patcth is then selected and prepared for a patch angioplasty.  It is cut and beveled and started at the proximal endpoint with a 6-0 Prolene suture.  Approximately one half of the suture line is run medially and laterally and the distal end point was cut and bevelled to match the arteriotomy.  A second 6-0 Prolene was started at  the distal end point and run to the  mid portion to complete the arteriotomy.  The vessel was flushed prior to release of control and completion of the anastomosis.  At this point, flow was established first to the profunda femoris artery and then to the superficial femoral artery. Palpable pulses are noted well beyond the anastomosis in both arteries.  The right femoral artery is then addressed. Arteriotomy is made in the common femoral artery and extended down into the first 2-3 cm of the profunda femoris artery. Similarly, an endarterectomy was performed with the [facility]. The proximal endpoint was cut flush with tenotomy scissors in the proximal common femoral artery. The arteriotomy was carried down onto the profunda femoris artery and the endarterectomy was continued to this point. The distal endpoint was reasonably clean and 4 6-0 Prolene tacking sutures used to tack down the distal endpoint in the profunda femoris artery. The Cormatrix extracellular patch was then brought onto the field.  It is cut and beveled and started at the proximal endpoint with a 6-0 Prolene suture.  Approximately one half of the suture line is run medially and laterally and the distal end point was cut and bevelled to match the arteriotomy.  A second 6-0 Prolene was started at the distal end point and run to the mid portion to complete the arteriotomy.   Flushing maneuvers were performed and flow was reestablished to the femoral vessels. Excellent pulses noted in the right superficial femoral and profunda femoris artery below the femoral anastomosis.  We then gained access to both common femoral arteries near the SFA origin medial to our patch. An 8 French sheath was placed on the right and a 7 French sheath was placed on the left. A pigtail catheter was placed up the left and navigated through the left iliac stenosis and aortic napkin ring type lesion and into the perirenal aorta. An aortogram was performed. With a Kumpe catheter and a Magic torque wire  access was gained across the right common iliac artery stenosis and across the stenosis in the distal aorta. From the right femoral approach, an 11 mm diameter by 39 mm length Gore VBX stent was selected for the aortic lesion. This was deployed across the lesion from the right femoral sheath after pulling down the pigtail catheter from the left. I then used a Kumpe catheter and the Magic torque wire to gain access through the stent in the aorta and confirm intraluminal flow proximally. Kissing balloon 7 mm and then an 8 mm balloons were used to fully oppose the stent in the aorta with less than 25% residual stenosis identified.  We then turned our attention to the common iliac artery lesions. After exchanging for a 0.018 wires bilaterally, we selected Viabahn covered stents to treat the iliac lesions. We rebuilt the aortic bifurcation about a centimeter above the previous bifurcation up into the previously placed aortic stent. We selected a 73m diameter by 7.5 cm length Viabahn covered stents in both common iliac arteries. These were selected over balloon expandable stents due to the markedly tortuosity particularly in the left iliac artery. These were deployed just above the hypogastric arteries bilaterally. These were then postdilated with an 8 mm balloons bilaterally with excellent angiographic completion result and less than 10% residual stenosis on each side.  Completion angiograms were then performed of the right external iliac artery which was found to be widely patent and through the right femoral sheath to image the endarterectomy site, proximal profunda femoris artery,  and proximal SFA all of which appeared patent with good flow. Completion angiogram was then performed in the left femoral sheath which identified about a 60-65% stenosis of the distal left external iliac artery just above the endarterectomy site in the proximal common femoral artery. With the sheath and wire still in place, we treated  this lesion with a 6 mm diameter by 4 cm length angioplasty balloon inflated to 8 atm for 1 minute. Completion angiogram showed less than 10% residual stenosis in the distal left external iliac artery after this angioplasty and no dissection. The proximal SFA and profunda femoris artery and the common femoral artery endarterectomy site appeared patent with less than 20% stenosis identified. Both sheaths were then removed with 6-0 Prolene pursestring sutures placed to close the access site in the femoral arteries with excellent hemostatic result.  Surgicel and Evicel topical hemostatic agents were placed in the femoral incisions and hemostasis was complete. The femoral incisions were then closed in a layered fashion with 2 layers of 2-0 Vicryl, 2 layers of 3-0 Vicryl, and 4-0 Monocryl for the skin closure. Dermabond and sterile dressing were then placed over all incisions.  The patient was then awakened from anesthesia and taken to the recovery room in stable condition having tolerated the procedure well.  COMPLICATIONS: None  CONDITION: Stable     Maxene Byington 04/20/2015 12:07 PM

## 2015-04-20 NOTE — Consult Note (Signed)
[facility] Consultation     Date: 04/20/2015,   MRN# [id number] Cynthia [name] [name] 1925-11-12 Code Status:  Code Status History    Date Active Date Inactive Code Status Order ID Comments User Context   02/07/2015 12:11 AM 02/08/2015  4:53 PM DNR [id number]  Ramonita LabAruna Gouru, MD Inpatient   02/06/2015  9:48 PM 02/07/2015 12:11 AM DNR [id number]  Ramonita LabAruna Gouru, MD ED    Questions for Most Recent Historical Code Status (Order [id number])    Question Answer Comment   In the event of cardiac or respiratory ARREST Do not call a "code blue"    In the event of cardiac or respiratory ARREST Do not perform Intubation, CPR, defibrillation or ACLS    In the event of cardiac or respiratory ARREST Use medication by any route, position, wound care, and other measures to relive pain and suffering. May use oxygen, suction and manual treatment of airway obstruction as needed for comfort.    Comments Patient is refusing BiPAP, RN may pronounce     Advance Directive Documentation        Most Recent Value   Type of Advance Directive  Healthcare Power of Attorney, Living will   Pre-existing out of facility DNR order (yellow form or pink MOST form)     "MOST" Form in Place?       Hosp day:@LENGTHOFSTAYDAYS @ Referring MD: @ATDPROV @     PCP:      AdmissionWeight: 134 lb (60.782 kg)                 CurrentWeight: 134 lb (60.782 kg) Cynthia [name] is a 82 y.o. old female seen in consultation for COPD at the request of Dr. Gilda CreaseSchnier     CHIEF COMPLAINT:   Post op,  lower ext leg pain  HISTORY OF PRESENT ILLNESS  82 yo whe female s 82 yo white female seen today for long standing COPD Patient is post op extensive b/l lower ext vasc surgery repair and stents Patient is on chronic oxygen at night 2.5 L [place] Patient is alert and awake, following commands, NAD, no sob  Patient was successfully extubated after surgery   Current facility-administered medications:  .  0.9 % NaCl with KCl 20 mEq/ L   infusion, , Intravenous, Continuous, Renford DillsGregory G Schnier, MD, Last Rate: 75 mL/hr at 04/20/15 1521, 75 mL/hr at 04/20/15 1521 .  acetaminophen (TYLENOL) tablet 325-650 mg, 325-650 mg, Oral, Q4H PRN **OR** acetaminophen (TYLENOL) suppository 325-650 mg, 325-650 mg, Rectal, Q4H PRN, Renford DillsGregory G Schnier, MD .  albuterol (PROVENTIL) (2.5 MG/3ML) 0.083% nebulizer solution 2.5 mg, 2.5 mg, Nebulization, Q4H, Erin FullingKurian Marshon Bangs, MD .  alum & mag hydroxide-simeth (MAALOX/MYLANTA) 200-200-20 MG/5ML suspension 15-30 mL, 15-30 mL, Oral, Q2H PRN, Renford DillsGregory G Schnier, MD .  aspirin EC tablet 81 mg, 81 mg, Oral, Q1200, Renford DillsGregory G Schnier, MD, 81 mg at 04/20/15 1432 .  budesonide (PULMICORT) nebulizer solution 0.5 mg, 0.5 mg, Nebulization, BID, Erin FullingKurian Jencarlos Nicolson, MD .  Melene Muller[START ON 04/21/2015] cholecalciferol (VITAMIN D) tablet 1,000 Units, 1,000 Units, Oral, Q1200, Renford DillsGregory G Schnier, MD .  clindamycin (CLEOCIN) IVPB 300 mg, 300 mg, Intravenous, 3 times per day, Renford DillsGregory G Schnier, MD, 300 mg at 04/20/15 1432 .  guaiFENesin (MUCINEX) 12 hr tablet 600 mg, 600 mg, Oral, BID PRN **AND** dextromethorphan (DELSYM) 30 MG/5ML liquid 30 mg, 30 mg, Oral, BID PRN, Renford DillsGregory G Schnier, MD .  docusate sodium (COLACE) capsule 100 mg, 100 mg, Oral, BID, Renford DillsGregory G Schnier, MD, 100  mg at 04/20/15 1433 .  [START ON 04/21/2015] enoxaparin (LOVENOX) injection 40 mg, 40 mg, Subcutaneous, Q24H, Renford Dills, MD .  Melene Muller ON 04/21/2015] ferrous sulfate tablet 325 mg, 325 mg, Oral, Q1200, Renford Dills, MD .  hydrALAZINE (APRESOLINE) injection 5 mg, 5 mg, Intravenous, Q20 Min PRN, Renford Dills, MD .  HYDROmorphone (DILAUDID) injection 0.5-1 mg, 0.5-1 mg, Intravenous, Q2H PRN, Renford Dills, MD .  ibuprofen (ADVIL,MOTRIN) tablet 600 mg, 600 mg, Oral, Q6H PRN, Renford Dills, MD .  ipratropium (ATROVENT) nebulizer solution 0.5 mg, 0.5 mg, Nebulization, Q6H PRN, Renford Dills, MD .  Melene Muller ON 04/21/2015] isosorbide mononitrate (IMDUR) 24 hr tablet 30  mg, 30 mg, Oral, Q1200, Renford Dills, MD .  ketorolac (TORADOL) tablet 10 mg, 10 mg, Oral, 3 times per day, Renford Dills, MD, 10 mg at 04/20/15 1430 .  labetalol (NORMODYNE,TRANDATE) injection 10 mg, 10 mg, Intravenous, Q10 min PRN, Renford Dills, MD .  Melene Muller ON 04/21/2015] levothyroxine (SYNTHROID, LEVOTHROID) tablet 75 mcg, 75 mcg, Oral, QAC breakfast, Renford Dills, MD .  Melene Muller ON 04/21/2015] loratadine (CLARITIN) tablet 10 mg, 10 mg, Oral, Daily, Renford Dills, MD .  magnesium citrate solution 1 Bottle, 1 Bottle, Oral, Once PRN, Renford Dills, MD .  Melene Muller ON 04/21/2015] magnesium oxide (MAG-OX) tablet 400 mg, 400 mg, Oral, Q1200, Renford Dills, MD .  metoprolol (LOPRESSOR) injection 5 mg, 5 mg, Intravenous, 4 times per day, Renford Dills, MD, 5 mg at 04/20/15 1400 .  mometasone-formoterol (DULERA) 100-5 MCG/ACT inhaler 2 puff, 2 puff, Inhalation, BID, Erin Fulling, MD .  multivitamin-lutein (OCUVITE-LUTEIN) capsule 1 capsule, 1 capsule, Oral, Q1200, Renford Dills, MD, 1 capsule at 04/20/15 1215 .  nitroGLYCERIN (NITROSTAT) SL tablet 0.4 mg, 0.4 mg, Sublingual, Q5 min PRN, Renford Dills, MD .  ondansetron [facility]) injection 4 mg, 4 mg, Intravenous, Q6H PRN, Renford Dills, MD .  Melene Muller ON 04/21/2015] pantoprazole (PROTONIX) EC tablet 40 mg, 40 mg, Oral, Daily, Renford Dills, MD .  polyethylene glycol (MIRALAX / GLYCOLAX) packet 17 g, 17 g, Oral, Daily PRN, Renford Dills, MD .  Melene Muller ON 04/21/2015] predniSONE (DELTASONE) tablet 10 mg, 10 mg, Oral, Q breakfast, Renford Dills, MD .  sorbitol 70 % solution 30 mL, 30 mL, Oral, Daily PRN, Renford Dills, MD .  tiotropium [facility]) inhalation capsule 18 mcg, 18 mcg, Inhalation, Daily, Erin Fulling, MD .  vitamin C (ASCORBIC ACID) tablet 500 mg, 500 mg, Oral, Q1200, Renford Dills, MD, 500 mg at 04/20/15 1215     ALLERGIES   Nitrofurantoin monohyd macro; Levaquin; Codeine; Crestor; Duratuss g;  Oxycodone-acetaminophen; Propoxyphene; Tramadol; and Morphine     REVIEW OF SYSTEMS   Review of Systems  Constitutional: Negative for fever, chills, weight loss, malaise/fatigue and diaphoresis.  HENT: Negative for congestion.   Eyes: Negative for blurred vision.  Respiratory: Negative for cough, hemoptysis, sputum production, shortness of breath and wheezing.   Cardiovascular: Positive for claudication. Negative for chest pain, palpitations and orthopnea.  Gastrointestinal: Negative for heartburn, nausea, vomiting and abdominal pain.  Genitourinary: Negative for dysuria and urgency.  Musculoskeletal: Positive for joint pain.  Skin: Negative for rash.  Neurological: Negative for dizziness and headaches.  Endo/Heme/Allergies: Does not bruise/bleed easily.  Psychiatric/Behavioral: Negative for depression. The patient is not nervous/anxious.   All other systems reviewed and are negative.    VS: BP 110/65 mmHg  Pulse 57  Temp(Src) 97  F (36.1 C) (Oral)  Resp 19  Ht 5' (1.524 m)  Wt 134 lb (60.782 kg)  BMI 26.17 kg/m2  SpO2 95%     PHYSICAL EXAM   Physical Exam  Constitutional: She is oriented to person, place, and time. She appears well-developed and well-nourished. No distress.  HENT:  Head: Normocephalic and atraumatic.  Mouth/Throat: No oropharyngeal exudate.  Eyes: EOM are normal. Pupils are equal, round, and reactive to light. No scleral icterus.  Neck: Normal range of motion. Neck supple.  Cardiovascular: Normal rate, regular rhythm and normal heart sounds.   No murmur heard. Pulmonary/Chest: No stridor. No respiratory distress. She has no wheezes.  Abdominal: Soft. Bowel sounds are normal. She exhibits no distension. There is no tenderness. There is no rebound.  Musculoskeletal: Normal range of motion. She exhibits no edema.  Neurological: She is alert and oriented to person, place, and time. She displays normal reflexes. Coordination normal.  Skin: Skin is warm.  She is not diaphoretic.  Psychiatric: She has a normal mood and affect.        LABS    Recent Labs     04/20/15  1410  HGB  10.1*  HCT  30.3*  MCV  93.0  WBC  8.4  CREATININE  0.98  ,    CULTURE RESULTS   Recent Results (from the past 240 hour(s))  Surgical pcr screen     Status: None   Collection Time: 04/11/15 11:44 AM  Result Value Ref Range Status   MRSA, PCR NEGATIVE NEGATIVE Final   Staphylococcus aureus NEGATIVE NEGATIVE Final    Comment:        The Xpert SA Assay (FDA approved for NASAL specimens in patients over 22 years of age), is one component of a comprehensive surveillance program.  Test performance has been validated by [facility] for patients greater than or equal to 36 year old. It is not intended to diagnose infection nor to guide or monitor treatment.       ASSESSMENT/PLAN   82 yo white female admitted for post op resp care for COPD-she is not in distress, COPD stable at this time  1.oxygen as needed keep oxygen 88-92% 2.start spiriva, start dulera,start pumlicort and alb nebs 3.no need for steroids 4.anticipate transfer to gen med floor in next 24 hrs   I have personally obtained a history, examined the patient, evaluated laboratory and independently reviewed imaging results, formulated the assessment and plan and placed orders.  The Patient requires high complexity decision making for assessment and support, frequent evaluation and titration of therapies, application of advanced monitoring technologies and extensive interpretation of multiple databases.   Patient/Family are satisfied with Plan of action and management. All questions answered   Lucie Leather, M.D.  [facility]  Medical Director [facility] Medical Director [facility] Cardio-Pulmonary Department

## 2015-04-20 NOTE — Anesthesia Procedure Notes (Signed)
Procedure Name: Intubation Performed by: Casey BurkittHOANG, Cynthia Hreha Pre-anesthesia Checklist: Patient identified, Emergency Drugs available, Suction available, Patient being monitored and Timeout performed Patient Re-evaluated:Patient Re-evaluated prior to inductionOxygen Delivery Method: Circle system utilized Preoxygenation: Pre-oxygenation with 100% oxygen Intubation Type: IV induction Ventilation: Mask ventilation without difficulty Grade View: Grade I Tube type: Oral Tube size: 7.0 mm Number of attempts: 1 Airway Equipment and Method: Stylet and Patient positioned with wedge pillow Placement Confirmation: ETT inserted through vocal cords under direct vision,  positive ETCO2 and breath sounds checked- equal and bilateral Secured at: 21 cm Tube secured with: Tape Dental Injury: Teeth and Oropharynx as per pre-operative assessment

## 2015-04-20 NOTE — H&P (Signed)
[facility] History & Physical Update  The patient was interviewed and re-examined.  The patient's previous History and Physical has been reviewed and is unchanged.  There is no change in the plan of care. We plan to proceed with the scheduled procedure.  Schnier, Latina CraverGregory G, MD  04/20/2015, 7:43 AM

## 2015-04-20 NOTE — Progress Notes (Signed)
eLink Physician-Brief Progress Note Patient Name: Cynthia PughDaisy W [name] DOB: [birth year] MRN: [id number]   Date of Service  04/20/2015  HPI/Events of Note  Contacted by bedside nurse. Cardiac monitoring has not been ordered.  eICU Interventions  Continuous cardiac monitoring ordered.     Intervention Category Minor Interventions: Routine modifications to care plan (e.g. PRN medications for pain, fever)  Lawanda CousinsJennings Elvenia Godden 04/20/2015, 10:45 PM

## 2015-04-21 ENCOUNTER — Encounter: Payer: Self-pay | Admitting: Vascular Surgery

## 2015-04-21 LAB — BASIC METABOLIC PANEL
Anion gap: 5 (ref 5–15)
BUN: 23 mg/dL — AB (ref 6–20)
CALCIUM: 7.8 mg/dL — AB (ref 8.9–10.3)
CHLORIDE: 103 mmol/L (ref 101–111)
CO2: 27 mmol/L (ref 22–32)
CREATININE: 1.11 mg/dL — AB (ref 0.44–1.00)
GFR calc non Af Amer: 43 mL/min — ABNORMAL LOW (ref >60)
GFR, EST AFRICAN AMERICAN: 50 mL/min — AB (ref >60)
Glucose, Bld: 97 mg/dL (ref 65–99)
Potassium: 4 mmol/L (ref 3.5–5.1)
SODIUM: 135 mmol/L (ref 135–145)

## 2015-04-21 LAB — CBC
HCT: 26.9 % — ABNORMAL LOW (ref 35.0–47.0)
Hemoglobin: 8.9 g/dL — ABNORMAL LOW (ref 12.0–16.0)
MCH: 31.2 pg (ref 26.0–34.0)
MCHC: 33.1 g/dL (ref 32.0–36.0)
MCV: 94.3 fL (ref 80.0–100.0)
PLATELETS: 173 10*3/uL (ref 150–440)
RBC: 2.85 MIL/uL — ABNORMAL LOW (ref 3.80–5.20)
RDW: 13.6 % (ref 11.5–14.5)
WBC: 9.6 10*3/uL (ref 3.6–11.0)

## 2015-04-21 LAB — TYPE AND SCREEN
ABO/RH(D): O POS
Antibody Screen: NEGATIVE
Unit division: 0
Unit division: 0

## 2015-04-21 LAB — PREPARE RBC (CROSSMATCH)

## 2015-04-21 LAB — SURGICAL PATHOLOGY

## 2015-04-21 MED ORDER — ALBUTEROL SULFATE (2.5 MG/3ML) 0.083% IN NEBU
2.5000 mg | INHALATION_SOLUTION | RESPIRATORY_TRACT | Status: DC
  Administered 2015-04-21 – 2015-04-22 (×4): 2.5 mg via RESPIRATORY_TRACT
  Filled 2015-04-21 (×4): qty 3

## 2015-04-21 NOTE — Progress Notes (Signed)
eLink MD paged for clarification on Pt code status. MD returned page. MD stated that he would address code status with other MD and get the proper order placed.

## 2015-04-21 NOTE — Progress Notes (Signed)
Pt to be transferred to room 214, report called to [employer] rn.  Pt is alert and oriented, no complaints of pain.  NSR, lungs diminished, 2-3L [place].  Tolerating diet.  Foley removed and voiding, a-line removed without complications.  Pt up ambulating with walker and 1 person assist. VSS, afebrile.

## 2015-04-21 NOTE — Consult Note (Signed)
[facility] [place] Pulmonary Medicine Consultation     Date: 04/21/2015,   MRN# [id number] Cynthia [name] September 26, 1925 Code Status:  Code Status History    Date Active Date Inactive Code Status Order ID Comments User Context   02/07/2015 12:11 AM 02/08/2015  4:53 PM DNR [id number]  Ramonita Lab, MD Inpatient   02/06/2015  9:48 PM 02/07/2015 12:11 AM DNR [id number]  Ramonita Lab, MD ED    Questions for Most Recent Historical Code Status (Order [id number])    Question Answer Comment   In the event of cardiac or respiratory ARREST Do not call a "code blue"    In the event of cardiac or respiratory ARREST Do not perform Intubation, CPR, defibrillation or ACLS    In the event of cardiac or respiratory ARREST Use medication by any route, position, wound care, and other measures to relive pain and suffering. May use oxygen, suction and manual treatment of airway obstruction as needed for comfort.    Comments Patient is refusing BiPAP, RN may pronounce     Advance Directive Documentation        Most Recent Value   Type of Advance Directive  Healthcare Power of Attorney, Living will   Pre-existing out of facility DNR order (yellow form or pink MOST form)     "MOST" Form in Place?       Hosp day:@LENGTHOFSTAYDAYS @ Referring MD: @     PCP:      AdmissionWeight: 134 lb (60.782 kg)                 CurrentWeight: 134 lb (60.782 kg) Cynthia [name] is a 82 y.o. old female seen in consultation for COPD at the request of Dr. Gilda Crease     CHIEF COMPLAINT:   Post op,  lower ext leg pain  HISTORY OF PRESENT ILLNESS  82 yo whe female s COPD stable, alert and awake, NAD No chest pain or SOB     ALLERGIES   Nitrofurantoin monohyd macro; Levaquin; Codeine; Crestor; Duratuss g; Oxycodone-acetaminophen; Propoxyphene; Tramadol; and Morphine     REVIEW OF SYSTEMS   Review of Systems  Constitutional: Negative for fever, chills and weight loss.  HENT: Negative for congestion.   Eyes: Negative for  blurred vision.  Respiratory: Negative for cough, hemoptysis, sputum production, shortness of breath and wheezing.   Cardiovascular: Positive for claudication. Negative for chest pain, palpitations and orthopnea.  Gastrointestinal: Negative for heartburn, nausea, vomiting and abdominal pain.  Genitourinary: Negative for dysuria and urgency.  Musculoskeletal: Positive for joint pain.  Neurological: Negative for dizziness.  Endo/Heme/Allergies: Does not bruise/bleed easily.  Psychiatric/Behavioral: Negative for depression. The patient is not nervous/anxious.   All other systems reviewed and are negative.    VS: BP 126/88 mmHg  Pulse 86  Temp(Src) 98.3 F (36.8 C) (Oral)  Resp 24  Ht 5' (1.524 m)  Wt 134 lb (60.782 kg)  BMI 26.17 kg/m2  SpO2 95%     PHYSICAL EXAM   Physical Exam  Constitutional: She is oriented to person, place, and time. She appears well-developed and well-nourished. No distress.  HENT:  Head: Normocephalic and atraumatic.  Mouth/Throat: No oropharyngeal exudate.  Eyes: Pupils are equal, round, and reactive to light.  Neck: Normal range of motion. Neck supple.  Cardiovascular: Normal rate, regular rhythm and normal heart sounds.   No murmur heard. Pulmonary/Chest: No respiratory distress. She has no wheezes.  Abdominal: Soft.  Musculoskeletal: Normal range of motion. She exhibits no edema.  Neurological: She is  alert and oriented to person, place, and time.  Skin: Skin is warm. She is not diaphoretic.  Psychiatric: She has a normal mood and affect.        LABS    Recent Labs     04/20/15  1410  04/21/15  0638  HGB  10.1*  8.9*  HCT  30.3*  26.9*  MCV  93.0  94.3  WBC  8.4  9.6  BUN   --   23*  CREATININE  0.98  1.11*  GLUCOSE   --   97  CALCIUM   --   7.8*  ,    CULTURE RESULTS   Recent Results (from the past 240 hour(s))  Surgical pcr screen     Status: None   Collection Time: 04/11/15 11:44 AM  Result Value Ref Range Status    MRSA, PCR NEGATIVE NEGATIVE Final   Staphylococcus aureus NEGATIVE NEGATIVE Final    Comment:        The Xpert SA Assay (FDA approved for NASAL specimens in patients over 80 years of age), is one component of a comprehensive surveillance program.  Test performance has been validated by [facility] for patients greater than or equal to 80 year old. It is not intended to diagnose infection nor to guide or monitor treatment.   MRSA PCR Screening     Status: None   Collection Time: 04/20/15  3:10 PM  Result Value Ref Range Status   MRSA by PCR NEGATIVE NEGATIVE Final    Comment:        The GeneXpert MRSA Assay (FDA approved for NASAL specimens only), is one component of a comprehensive MRSA colonization surveillance program. It is not intended to diagnose MRSA infection nor to guide or monitor treatment for MRSA infections.       ASSESSMENT/PLAN   82 yo white female admitted for post op resp care for COPD-she is not in distress, COPD stable at this time  1.oxygen as needed keep oxygen 88-92% 2.started spiriva, started dulera,started pumlicort and alb nebs 3.no need for steroids 4.OK to transfer to gen med floor, remove foley catheter    The Patient requires high complexity decision making for assessment and support, frequent evaluation and titration of therapies, application of advanced monitoring technologies and extensive interpretation of multiple databases.   Patient/Family are satisfied with Plan of action and management. All questions answered   Cynthia [name], M.D.  [facility]  Medical Director [facility] Medical Director [facility] Card[facility]

## 2015-04-21 NOTE — Progress Notes (Signed)
[facility]  Daily Progress Note   Subjective  - postop day 1   Patient is sitting in bed alert when asked about pain she notes minimal pain in her groin areas but her feet no longer hurting whatsoever. She denies shortness of breath or difficulty breathing states that she is feeling quite comfortable I actually watched her ambulate in the ICU and she did exceedingly well  Objective Filed Vitals:   04/21/15 1349 04/21/15 1500 04/21/15 1600 04/21/15 1700  BP: 131/57 132/63 159/78 140/70  Pulse: 92 69 74 71  Temp: 98.4 F (36.9 C)     TempSrc: Oral     Resp: 24 21 23 26   Height:      Weight:      SpO2: 96% 99% 98% 97%    Intake/Output Summary (Last 24 hours) at 04/21/15 1830 Last data filed at 04/21/15 1806  Gross per 24 hour  Intake    895 ml  Output   1050 ml  Net   -155 ml    PULM  Normal effort , no use of accessory muscles CV  No JVD, RRR Abd      No distended, nontender VASC  incisions clean dry and intact. Feet are pink warm and hyperemic pedal pulses not palpable as expected  Laboratory CBC    Component Value Date/Time   WBC 9.6 04/21/2015 0638   WBC 8.6 11/15/2014 1242   WBC 16.0* 01/27/2014 1716   HGB 8.9* 04/21/2015 0638   HGB 11.7* 01/27/2014 1716   HCT 26.9* 04/21/2015 0638   HCT 40.9 11/15/2014 1242   HCT 36.5 01/27/2014 1716   PLT 173 04/21/2015 0638   PLT 336 11/15/2014 1242   PLT 198 01/27/2014 1716    BMET    Component Value Date/Time   NA 135 04/21/2015 0638   NA 142 11/15/2014 1242   NA 132* 01/27/2014 1716   K 4.0 04/21/2015 0638   K 4.2 01/27/2014 1716   CL 103 04/21/2015 0638   CL 98 01/27/2014 1716   CO2 27 04/21/2015 0638   CO2 25 01/27/2014 1716   GLUCOSE 97 04/21/2015 0638   GLUCOSE 107* 11/15/2014 1242   GLUCOSE 126* 01/27/2014 1716   BUN 23* 04/21/2015 0638   BUN 16 11/15/2014 1242   BUN 24* 01/27/2014 1716   CREATININE 1.11* 04/21/2015 0638   CREATININE 1.48* 01/27/2014 1716   CALCIUM 7.8*  04/21/2015 0638   CALCIUM 8.9 01/27/2014 1716   GFRNONAA 43* 04/21/2015 0638   GFRNONAA 36* 01/27/2014 1716   GFRNONAA 44* 07/15/2013 1435   GFRAA 50* 04/21/2015 0638   GFRAA 43* 01/27/2014 1716   GFRAA 51* 07/15/2013 1435    Assessment/Planning:   Status post aorto iliac stenting with bilateral femoral endarterectomies  Patient is doing well plan to transfer to floor will get physical therapy to see her tomorrow hopefully she will be DC'd to home on Saturday    Renford DillsSchnier, Gregory G  04/21/2015, 6:30 PM

## 2015-04-22 LAB — CBC
HCT: 26.8 % — ABNORMAL LOW (ref 35.0–47.0)
Hemoglobin: 9 g/dL — ABNORMAL LOW (ref 12.0–16.0)
MCH: 31.4 pg (ref 26.0–34.0)
MCHC: 33.6 g/dL (ref 32.0–36.0)
MCV: 93.6 fL (ref 80.0–100.0)
Platelets: 160 10*3/uL (ref 150–440)
RBC: 2.86 MIL/uL — ABNORMAL LOW (ref 3.80–5.20)
RDW: 13.7 % (ref 11.5–14.5)
WBC: 5.6 10*3/uL (ref 3.6–11.0)

## 2015-04-22 LAB — URINALYSIS COMPLETE WITH MICROSCOPIC (ARMC ONLY)
BACTERIA UA: NONE SEEN
BILIRUBIN URINE: NEGATIVE
GLUCOSE, UA: NEGATIVE mg/dL
HGB URINE DIPSTICK: NEGATIVE
Ketones, ur: NEGATIVE mg/dL
NITRITE: NEGATIVE
Protein, ur: NEGATIVE mg/dL
RBC / HPF: NONE SEEN RBC/hpf (ref 0–5)
SPECIFIC GRAVITY, URINE: 1.004 — AB (ref 1.005–1.030)
pH: 7 (ref 5.0–8.0)

## 2015-04-22 LAB — BASIC METABOLIC PANEL
Anion gap: 4 — ABNORMAL LOW (ref 5–15)
BUN: 24 mg/dL — AB (ref 6–20)
CALCIUM: 7.8 mg/dL — AB (ref 8.9–10.3)
CO2: 29 mmol/L (ref 22–32)
CREATININE: 1.2 mg/dL — AB (ref 0.44–1.00)
Chloride: 105 mmol/L (ref 101–111)
GFR calc non Af Amer: 39 mL/min — ABNORMAL LOW (ref >60)
GFR, EST AFRICAN AMERICAN: 46 mL/min — AB (ref >60)
Glucose, Bld: 102 mg/dL — ABNORMAL HIGH (ref 65–99)
Potassium: 4.3 mmol/L (ref 3.5–5.1)
SODIUM: 138 mmol/L (ref 135–145)

## 2015-04-22 MED ORDER — ALBUTEROL SULFATE (2.5 MG/3ML) 0.083% IN NEBU
2.5000 mg | INHALATION_SOLUTION | Freq: Three times a day (TID) | RESPIRATORY_TRACT | Status: DC
  Administered 2015-04-22 – 2015-04-23 (×3): 2.5 mg via RESPIRATORY_TRACT
  Filled 2015-04-22 (×4): qty 3

## 2015-04-22 MED ORDER — CLOPIDOGREL BISULFATE 75 MG PO TABS
75.0000 mg | ORAL_TABLET | Freq: Every day | ORAL | Status: DC
  Administered 2015-04-22 – 2015-04-23 (×2): 75 mg via ORAL
  Filled 2015-04-22 (×2): qty 1

## 2015-04-22 MED ORDER — ENOXAPARIN SODIUM 30 MG/0.3ML ~~LOC~~ SOLN
30.0000 mg | SUBCUTANEOUS | Status: DC
  Administered 2015-04-23: 30 mg via SUBCUTANEOUS
  Filled 2015-04-22: qty 0.3

## 2015-04-22 NOTE — Progress Notes (Signed)
Pharmacy note - Anticoagulation  Patient with orders for enoxaparin 40mg  SQ Q24H for VTE prophylaxis.  Estimated Creatinine Clearance: 27 mL/min (by C-G formula based on Cr of 1.2).  Will adjust to enoxaparin 30mg  SQ Q24H per policy for CrCl < 6830ml/min  Will continue to follow and adjust per anticoagulation policy  Garlon HatchetJody Devanny Palecek, PharmD Clinical Pharmacist  04/22/2015 11:31 AM

## 2015-04-22 NOTE — Progress Notes (Signed)
[facility]  Daily Progress Note   Subjective  - postop day 1   Patient has been walking multiple time when asked about pain she notes minimal pain in her groin areas but her feet no longer hurting whatsoever. She denies shortness of breath or difficulty breathing states that she is feeling quite comfortable she does not a bit of burnign with urination Objective Filed Vitals:   04/22/15 0810 04/22/15 1300 04/22/15 1308 04/22/15 1348  BP:   130/42   Pulse:  72 79   Temp:  97.8 F (36.6 C)    TempSrc:  Oral    Resp:  20    Height:      Weight:      SpO2: 100%   97%    Intake/Output Summary (Last 24 hours) at 04/22/15 1645 Last data filed at 04/22/15 1615  Gross per 24 hour  Intake   1377 ml  Output   1500 ml  Net   -123 ml    PULM  Normal effort , no use of accessory muscles CV  No JVD, RRR Abd      No distended, nontender VASC  incisions clean dry and intact. Feet are pink warm and hyperemic pedal pulses not palpable as expected  Laboratory CBC    Component Value Date/Time   WBC 5.6 04/22/2015 0458   WBC 8.6 11/15/2014 1242   WBC 16.0* 01/27/2014 1716   HGB 9.0* 04/22/2015 0458   HGB 11.7* 01/27/2014 1716   HCT 26.8* 04/22/2015 0458   HCT 40.9 11/15/2014 1242   HCT 36.5 01/27/2014 1716   PLT 160 04/22/2015 0458   PLT 336 11/15/2014 1242   PLT 198 01/27/2014 1716    BMET    Component Value Date/Time   NA 138 04/22/2015 0458   NA 142 11/15/2014 1242   NA 132* 01/27/2014 1716   K 4.3 04/22/2015 0458   K 4.2 01/27/2014 1716   CL 105 04/22/2015 0458   CL 98 01/27/2014 1716   CO2 29 04/22/2015 0458   CO2 25 01/27/2014 1716   GLUCOSE 102* 04/22/2015 0458   GLUCOSE 107* 11/15/2014 1242   GLUCOSE 126* 01/27/2014 1716   BUN 24* 04/22/2015 0458   BUN 16 11/15/2014 1242   BUN 24* 01/27/2014 1716   CREATININE 1.20* 04/22/2015 0458   CREATININE 1.48* 01/27/2014 1716   CALCIUM 7.8* 04/22/2015 0458   CALCIUM 8.9 01/27/2014 1716   GFRNONAA 39* 04/22/2015 0458   GFRNONAA 36* 01/27/2014 1716   GFRNONAA 44* 07/15/2013 1435   GFRAA 46* 04/22/2015 0458   GFRAA 43* 01/27/2014 1716   GFRAA 51* 07/15/2013 1435    Assessment/Planning:   Status post aorto iliac stenting with bilateral femoral endarterectomies  Patient is doing well plan to transfer to floor will get physical therapy to see her tomorrow hopefully she will be DC'd to home tomorrow I will check a UA    Schnier, Latina CraverGregory G  04/22/2015, 4:45 PM

## 2015-04-22 NOTE — Evaluation (Signed)
Physical Therapy Evaluation Patient Details Name: Cynthia [name] MRN: [id number] DOB: 1925-09-18 Today's Date: 04/22/2015   History of Present Illness  Pt has beeing having LE circulatory issues for 4-5 months, She had b/l angioplasties 3/8 and reports that she is generally feeling better.   Clinical Impression  Pt does well with PT exam showing good effort with ambulation, mobility and generally pleasant and willing to participate.  She reports he does no normally do a lot of walking, etc but that she does not feel too far from her baseline.  She has some fatigue with just under 100 ft of ambulation but she was safe, consistent and should be able to return home (with assist from family/neighbors).      Follow Up Recommendations No PT follow up;Home health PT (per pt progress)    Equipment Recommendations       Recommendations for Other Services       Precautions / Restrictions Precautions Precautions: Fall Restrictions Weight Bearing Restrictions: No      Mobility  Bed Mobility Overal bed mobility: Needs Assistance Bed Mobility: Supine to Sit     Supine to sit: Min guard     General bed mobility comments: Pt has hospital bed at home, reports she can use rails to get herself up.  Needs very minimal assist during PT exam.  Transfers Overall transfer level: Modified independent Equipment used: Rolling walker (2 wheeled)             General transfer comment: Pt is able to rise to standing with only CGA, she does rely on the walker, but ultiamtely is safe and able to do limited ambulation.   Ambulation/Gait Ambulation/Gait assistance: Min guard Ambulation Distance (Feet): 85 Feet Assistive device: Rolling walker (2 wheeled)       General Gait Details: Pt reports that she is used to her rollator and that the FWW is harder to handle.  She has no safety issues and though she has some fatigue with the effort she is safe.  Stairs            Wheelchair Mobility     Modified Rankin (Stroke Patients Only)       Balance Overall balance assessment: Modified Independent                                           Pertinent Vitals/Pain Pain Score: 0-No pain (none at rest, some minimal pain in groin and stomach )    Home Living Family/patient expects to be discharged to:: Private residence Living Arrangements: Alone Available Help at Discharge: Family (4 nieces and neighbor check on her daily)   Home Access: Stairs to enter              Prior Function Level of Independence: Independent with assistive device(s)         Comments: Pt uses walker and O2 at home (3 liters during activity)     Hand Dominance        Extremity/Trunk Assessment   Upper Extremity Assessment: Overall WFL for tasks assessed (age appropriate shoulder elevation limitations)           Lower Extremity Assessment: Overall WFL for tasks assessed         Communication   Communication: No difficulties  Cognition Arousal/Alertness: Awake/alert Behavior During Therapy: WFL for tasks assessed/performed Overall Cognitive Status: Within Functional Limits for tasks  assessed                      General Comments      Exercises        Assessment/Plan    PT Assessment Patient needs continued PT services  PT Diagnosis Difficulty walking;Generalized weakness   PT Problem List Decreased strength;Decreased activity tolerance;Decreased balance;Decreased knowledge of use of DME  PT Treatment Interventions DME instruction;Gait training;Stair training;Therapeutic exercise;Therapeutic activities;Balance training;Patient/family education   PT Goals (Current goals can be found in the Care Plan section) Acute Rehab PT Goals Patient Stated Goal: "I want to go home" PT Goal Formulation: With patient Time For Goal Achievement: 05/06/15 Potential to Achieve Goals: Fair    Frequency Min 2X/week   Barriers to discharge         Co-evaluation               End of Session Equipment Utilized During Treatment: Gait belt;Oxygen (2 liters, 3 during activity) Activity Tolerance: Patient tolerated treatment well Patient left: with chair alarm set;with call bell/phone within reach           Time: 0939-1000 PT Time Calculation (min) (ACUTE ONLY): 21 min   Charges:   PT Evaluation $PT Eval Low Complexity: 1 Procedure     PT G Codes:       Loran SentersGalen Anjalee Cope, PT, DPT [phone]  Malachi ProGalen R Terricka Onofrio 04/22/2015, 12:08 PM

## 2015-04-22 NOTE — Progress Notes (Addendum)
Per Dr. Wyn Quakerew D/C order for scheduled IV metoprolol.

## 2015-04-22 NOTE — Care Management Important Message (Signed)
Important Message  Patient Details  Name: Cynthia [name] MRN: [id number] Date of Birth: 1925/05/18   Medicare Important Message Given:  Yes    Olegario MessierKathy A Treasure Ingrum 04/22/2015, 9:40 AM

## 2015-04-23 LAB — CREATININE, SERUM
CREATININE: 1.09 mg/dL — AB (ref 0.44–1.00)
GFR, EST AFRICAN AMERICAN: 51 mL/min — AB (ref >60)
GFR, EST NON AFRICAN AMERICAN: 44 mL/min — AB (ref >60)

## 2015-04-23 MED ORDER — SULFAMETHOXAZOLE-TRIMETHOPRIM 800-160 MG PO TABS: 1.0000 | ORAL_TABLET | Freq: Two times a day (BID) | ORAL | Status: DC

## 2015-04-23 MED ORDER — IPRATROPIUM BROMIDE 0.02 % IN SOLN: 0.5000 mg | Freq: Four times a day (QID) | RESPIRATORY_TRACT | Status: DC | PRN

## 2015-04-23 MED ORDER — ACETAMINOPHEN 325 MG PO TABS: 325.0000 mg | ORAL_TABLET | ORAL | Status: DC | PRN

## 2015-04-23 MED ORDER — KETOROLAC TROMETHAMINE 10 MG PO TABS: 10.0000 mg | ORAL_TABLET | Freq: Four times a day (QID) | ORAL | Status: DC | PRN

## 2015-04-23 MED ORDER — CLOPIDOGREL BISULFATE 75 MG PO TABS: 75.0000 mg | ORAL_TABLET | Freq: Every day | ORAL | Status: DC

## 2015-04-23 MED ORDER — LANSOPRAZOLE 30 MG PO CPDR: 30.0000 mg | DELAYED_RELEASE_CAPSULE | Freq: Every day | ORAL | Status: DC

## 2015-04-23 NOTE — Progress Notes (Signed)
[facility]  Daily Progress Note  Subjective: 3 Days Post-Op: Bilateral open common endarterectomies with core matrix patch angioplasty femoral, Bilateral open profunda femoris endarterectomies with core matrix patch angioplasty, Open angioplasty and stent placement of the abdominal aorta using a 11 x 39 VBX dilated to 16 mm, Open and plasty and stent placement of the right common iliac artery using an 8 x 7.5 Viabahn, Open angioplasty and stent placement of the left common iliac artery using an 8 x 7.5 Viabahn, Open angioplasty of the external iliac artery on the left to 6 mm  Patient doing well. Family member at bedside. Patient without complaint. Pain improved in bilateral lower extremities, walking well with walker. Some pain with urination - UA with trace leukocytes, no nitrates - will discharge with three days of Bactrim DS. Patient asking for d/c home. As per discussion with patient and family member no pain meds on discharge due to her allergies / reactions to them. She will finish and use sparingly Toradol (she already has at home). We had discussion about Toradol and her age and she knows to use it sparingly.   Objective: Filed Vitals:   04/22/15 2136 04/23/15 0534 04/23/15 1251 04/23/15 1254  BP: 153/59 162/68 167/79 161/62  Pulse: 82 76 97 92  Temp: 98.5 F (36.9 C) 98.2 F (36.8 C) 98.1 F (36.7 C)   TempSrc: Oral Oral Oral   Resp: Height:      Weight:      SpO2: 99% 98% 100% 100%    Intake/Output Summary (Last 24 hours) at 04/23/15 1259 Last data filed at 04/23/15 0926  Gross per 24 hour  Intake    883 ml  Output   1450 ml  Net   -567 ml   Physical Exam: A&Ox3, NAD CV: RRR Pulmonary: CTA Bilaterally Abdomen: Soft, Nontender, Nondistended Vascular: Incisions are clean dry and intact. No signs of infection noted. Bilateral lower extremities are warm. Minimal edema.   Laboratory: CBC    Component Value Date/Time   WBC 5.6  04/22/2015 0458   WBC 8.6 11/15/2014 1242   WBC 16.0* 01/27/2014 1716   HGB 9.0* 04/22/2015 0458   HGB 11.7* 01/27/2014 1716   HCT 26.8* 04/22/2015 0458   HCT 40.9 11/15/2014 1242   HCT 36.5 01/27/2014 1716   PLT 160 04/22/2015 0458   PLT 336 11/15/2014 1242   PLT 198 01/27/2014 1716   BMET    Component Value Date/Time   NA 138 04/22/2015 0458   NA 142 11/15/2014 1242   NA 132* 01/27/2014 1716   K 4.3 04/22/2015 0458   K 4.2 01/27/2014 1716   CL 105 04/22/2015 0458   CL 98 01/27/2014 1716   CO2 29 04/22/2015 0458   CO2 25 01/27/2014 1716   GLUCOSE 102* 04/22/2015 0458   GLUCOSE 107* 11/15/2014 1242   GLUCOSE 126* 01/27/2014 1716   BUN 24* 04/22/2015 0458   BUN 16 11/15/2014 1242   BUN 24* 01/27/2014 1716   CREATININE 1.09* 04/23/2015 0524   CREATININE 1.48* 01/27/2014 1716   CALCIUM 7.8* 04/22/2015 0458   CALCIUM 8.9 01/27/2014 1716   GFRNONAA 44* 04/23/2015 0524   GFRNONAA 36* 01/27/2014 1716   GFRNONAA 44* 07/15/2013 1435   GFRAA 51* 04/23/2015 0524   GFRAA 43* 01/27/2014 1716   GFRAA 51* 07/15/2013 1435   Assessment/Planning: 82 year old female s/p aorto-bifemoral - POD #3 - doing well 1) OK to discharge home 2) Discussed  with Dr. Charlie PitterSchnier  Ronneisha Jett PA-C 04/23/2015 12:59 PM

## 2015-04-23 NOTE — Progress Notes (Signed)
Paged Dr. Marijean HeathSchnier's PA regarding patient discharge.

## 2015-04-23 NOTE — Discharge Instructions (Signed)
You may shower today.  Keep incisions clean and dry. Toradol for pain only if needed.

## 2015-04-23 NOTE — Discharge Summary (Signed)
[facility]    Discharge Summary  Patient ID:  CURLEY FAYETTE MRN: [id number] DOB/AGE: [birth year] 82 y.o.  Admit date: 04/20/2015 Discharge date: 04/23/2015 Date of Surgery: 04/20/2015 Surgeon: Surgeon(s): Renford Dills, MD  Admission Diagnosis: Aortic angioplasty and Bilateral femoral endarterectomy    Dr Wyn Quaker to Assist   ANESTHESIA NEEDED Tacey Ruiz has been notified    No rep needed per Vernona Rieger  Discharge Diagnoses:  Aortic angioplasty and Bilateral femoral endarterectomy    Dr Wyn Quaker to Assist   ANESTHESIA NEEDED Tacey Ruiz has been notified    No rep needed per Vernona Rieger  Secondary Diagnoses: Past Medical History  Diagnosis Date  . Abdominal pain, epigastric   . Aneurysm of aorta (HCC)   . Osteoarthritis   . GERD (gastroesophageal reflux disease)   . Spinal stenosis of lumbar region without neurogenic claudication   . Insomnia   . Hypercholesteremia   . Osteoporosis   . Hypertension   . Stomatitis monilial   . CAD (coronary artery disease)   . Renal failure   . UTI symptoms   . Stroke (HCC)   . Allergic drug reaction   . Breathlessness on exertion   . Asthma   . COPD (chronic obstructive pulmonary disease) (HCC)   . Seborrheic keratoses   . Olecranon bursitis   . History of partial colectomy   . Pneumonia   . Hypoxia   . Nocturia   . Vaginal atrophy   . Hypothyroidism     Procedure(s): Endovascular Repair/Stent Graft  Discharged Condition: good  HPI:  The patient is an 82 year old female with severe aorto-iliac disease with bilateral lower extremity atherosclerotic disease with rest pain. The patient underwent a Bilateral open common endarterectomies with core matrix patch angioplasty femoral, Bilateral open profunda femoris endarterectomies with core matrix patch angioplasty, Open angioplasty and stent placement of the abdominal aorta using a 11 x 39 VBX dilated to 16 mm, Open and plasty and stent placement of the right common iliac artery using an 8 x  7.5 Viabahn, Open angioplasty and stent placement of the left common iliac artery using an 8 x 7.5 Viabahn, Open angioplasty of the external iliac artery on the left to 6 mm on 04/20/15. She tolerated the procedure well and was transferred to the PACU then ICU without issue.  The patients brief stay in the ICU was uncomplicated and she was transferred to the surgical floor without issue. The patients foley was removed, her diet was advanced and upon discharge was walking independently with a walker.  Patient started on Plavix at discharge.   Hospital Course:  AMANDALYNN PITZ is a 82 y.o. female is S/P Bilateral Procedure(s): Endovascular Repair/Stent Graft Extubated: POD # 0  Physical exam:  A&Ox3, NAD CV: RRR Pulmonary: CTA Bilaterally Abdomen: Soft, Nontender, Nondistended Vascular: Incisions are clean dry and intact. No signs of infection noted. Bilateral lower extremities are warm. Minimal edema.  Post-op wounds clean, dry, intact or healing well Pt. Ambulating, voiding and taking PO diet without difficulty. Pt pain controlled with PO pain meds.  Labs as below  Complications:none  Consults:   None  Significant Diagnostic Studies: CBC Lab Results  Component Value Date   WBC 5.6 04/22/2015   HGB 9.0* 04/22/2015   HCT 26.8* 04/22/2015   MCV 93.6 04/22/2015   PLT 160 04/22/2015   BMET    Component Value Date/Time   NA 138 04/22/2015 0458   NA 142 11/15/2014 1242   NA 132* 01/27/2014  1716   K 4.3 04/22/2015 0458   K 4.2 01/27/2014 1716   CL 105 04/22/2015 0458   CL 98 01/27/2014 1716   CO2 29 04/22/2015 0458   CO2 25 01/27/2014 1716   GLUCOSE 102* 04/22/2015 0458   GLUCOSE 107* 11/15/2014 1242   GLUCOSE 126* 01/27/2014 1716   BUN 24* 04/22/2015 0458   BUN 16 11/15/2014 1242   BUN 24* 01/27/2014 1716   CREATININE 1.09* 04/23/2015 0524   CREATININE 1.48* 01/27/2014 1716   CALCIUM 7.8* 04/22/2015 0458   CALCIUM 8.9 01/27/2014 1716   GFRNONAA 44* 04/23/2015 0524    GFRNONAA 36* 01/27/2014 1716   GFRNONAA 44* 07/15/2013 1435   GFRAA 51* 04/23/2015 0524   GFRAA 43* 01/27/2014 1716   GFRAA 51* 07/15/2013 1435   COAG Lab Results  Component Value Date   INR 0.96 04/11/2015   Disposition:  Discharge to :Home    Medication List    STOP taking these medications        ibuprofen 200 MG tablet  Commonly known as:  ADVIL,MOTRIN      TAKE these medications        acetaminophen 325 MG tablet  Commonly known as:  TYLENOL  Take 1-2 tablets (325-650 mg total) by mouth every 4 (four) hours as needed for mild pain (or temp >/= 101 F).     albuterol (2.5 MG/3ML) 0.083% nebulizer solution  Commonly known as:  PROVENTIL  Take 2.5 mg by nebulization every 6 (six) hours as needed for wheezing or shortness of breath. Pt mixes with her Atrovent nebulizer solution.     albuterol 108 (90 Base) MCG/ACT inhaler  Commonly known as:  PROVENTIL HFA;VENTOLIN HFA  Inhale 2 puffs into the lungs every 6 (six) hours as needed for wheezing or shortness of breath.     aspirin EC 81 MG tablet  Take 81 mg by mouth daily at 12 noon.     BREO ELLIPTA 200-25 MCG/INH Aepb  Generic drug:  fluticasone furoate-vilanterol  Inhale 1 puff into the lungs daily.     cholecalciferol 1000 units tablet  Commonly known as:  VITAMIN D  Take 1,000 Units by mouth daily.     clopidogrel 75 MG tablet  Commonly known as:  PLAVIX  Take 1 tablet (75 mg total) by mouth daily.     diclofenac sodium 1 % Gel  Commonly known as:  VOLTAREN  Apply 2 g topically 4 (four) times daily as needed (for pain).     ferrous sulfate 325 (65 FE) MG tablet  Take 325 mg by mouth daily.     fexofenadine 180 MG tablet  Commonly known as:  ALLEGRA  Take 180 mg by mouth daily.     Fish Oil 1000 MG Caps  Take 1,000 mg by mouth daily.     ipratropium 0.02 % nebulizer solution  Commonly known as:  ATROVENT  Take 2.5 mLs (0.5 mg total) by nebulization every 6 (six) hours as needed for wheezing or  shortness of breath. Pt mixes with her albuterol nebulizer solution.     isosorbide mononitrate 30 MG 24 hr tablet  Commonly known as:  IMDUR  Take 30 mg by mouth daily.     ketorolac 10 MG tablet  Commonly known as:  TORADOL  Take 1 tablet (10 mg total) by mouth every 6 (six) hours as needed for moderate pain.     lansoprazole 30 MG capsule  Commonly known as:  PREVACID  Take 30 mg by  mouth daily.     lansoprazole 30 MG capsule  Commonly known as:  PREVACID  Take 1 capsule (30 mg total) by mouth daily.     levothyroxine 75 MCG tablet  Commonly known as:  SYNTHROID, LEVOTHROID  Take 75 mcg by mouth daily before breakfast.     Magnesium Oxide 500 MG Tabs  Take 500 mg by mouth daily.     meloxicam 15 MG tablet  Commonly known as:  MOBIC  TAKE ONE TABLET BY MOUTH ONCE DAILY FOR ARTHRITIS     multivitamin-lutein Caps capsule  Take 1 capsule by mouth daily.     nitroGLYCERIN 0.4 MG SL tablet  Commonly known as:  NITROSTAT  Place 0.4 mg under the tongue every 5 (five) minutes as needed for chest pain.     POTASSIUM PO  Take 1 tablet by mouth daily.     predniSONE 10 MG tablet  Commonly known as:  DELTASONE  Take 1 tablet (10 mg total) by mouth daily with breakfast. RESTART THIS AFTER FINISHING OFF THE PREDNISONE TAPER IN 12 DAYS     sulfamethoxazole-trimethoprim 800-160 MG tablet  Commonly known as:  BACTRIM DS,SEPTRA DS  Take 1 tablet by mouth 2 (two) times daily.     vitamin C 500 MG tablet  Commonly known as:  ASCORBIC ACID  Take 500 mg by mouth daily.       Verbal and written Discharge instructions given to the patient. Wound care per Discharge AVS Follow-up Information    Follow up with Schnier, Latina CraverGregory G, MD In 2 weeks.   Specialties:  Vascular Surgery, Cardiology, Radiology, Vascular Surgery   Why:  Wound check    Contact information:   [address] [place] [address] [phone]       Please follow up.   Why:  Wound Check, Aorto-Iliac Duplex  with ABI      Signed: Zakariya Knickerbocker A Maddalynn Barnard, PA-C  04/23/2015, 1:16 PM

## 2015-04-23 NOTE — Progress Notes (Signed)
Alert and oriented. VSS. Discharge instructions given. Patient verbalized understanding. No other issues observed.

## 2015-04-25 ENCOUNTER — Encounter: Payer: Self-pay | Admitting: Vascular Surgery

## 2015-05-03 DIAGNOSIS — J9611 Chronic respiratory failure with hypoxia: Secondary | ICD-10-CM | POA: Diagnosis not present

## 2015-05-03 DIAGNOSIS — M4806 Spinal stenosis, lumbar region: Secondary | ICD-10-CM | POA: Diagnosis not present

## 2015-05-03 DIAGNOSIS — I129 Hypertensive chronic kidney disease with stage 1 through stage 4 chronic kidney disease, or unspecified chronic kidney disease: Secondary | ICD-10-CM | POA: Diagnosis not present

## 2015-05-03 DIAGNOSIS — N183 Chronic kidney disease, stage 3 (moderate): Secondary | ICD-10-CM | POA: Diagnosis not present

## 2015-05-03 DIAGNOSIS — J449 Chronic obstructive pulmonary disease, unspecified: Secondary | ICD-10-CM | POA: Diagnosis not present

## 2015-05-03 DIAGNOSIS — J45909 Unspecified asthma, uncomplicated: Secondary | ICD-10-CM | POA: Diagnosis not present

## 2015-05-03 DIAGNOSIS — I2510 Atherosclerotic heart disease of native coronary artery without angina pectoris: Secondary | ICD-10-CM | POA: Diagnosis not present

## 2015-05-03 DIAGNOSIS — E7800 Pure hypercholesterolemia, unspecified: Secondary | ICD-10-CM | POA: Diagnosis not present

## 2015-05-03 DIAGNOSIS — M1991 Primary osteoarthritis, unspecified site: Secondary | ICD-10-CM | POA: Diagnosis not present

## 2015-05-05 DIAGNOSIS — J449 Chronic obstructive pulmonary disease, unspecified: Secondary | ICD-10-CM | POA: Diagnosis not present

## 2015-05-09 DIAGNOSIS — M79609 Pain in unspecified limb: Secondary | ICD-10-CM | POA: Diagnosis not present

## 2015-05-09 DIAGNOSIS — I10 Essential (primary) hypertension: Secondary | ICD-10-CM | POA: Diagnosis not present

## 2015-05-09 DIAGNOSIS — E785 Hyperlipidemia, unspecified: Secondary | ICD-10-CM | POA: Diagnosis not present

## 2015-05-09 DIAGNOSIS — I70223 Atherosclerosis of native arteries of extremities with rest pain, bilateral legs: Secondary | ICD-10-CM | POA: Diagnosis not present

## 2015-05-09 DIAGNOSIS — I70213 Atherosclerosis of native arteries of extremities with intermittent claudication, bilateral legs: Secondary | ICD-10-CM | POA: Diagnosis not present

## 2015-05-09 DIAGNOSIS — M7989 Other specified soft tissue disorders: Secondary | ICD-10-CM | POA: Diagnosis not present

## 2015-05-09 DIAGNOSIS — M1990 Unspecified osteoarthritis, unspecified site: Secondary | ICD-10-CM | POA: Diagnosis not present

## 2015-05-09 DIAGNOSIS — I739 Peripheral vascular disease, unspecified: Secondary | ICD-10-CM | POA: Diagnosis not present

## 2015-05-09 DIAGNOSIS — J449 Chronic obstructive pulmonary disease, unspecified: Secondary | ICD-10-CM | POA: Diagnosis not present

## 2015-05-12 DIAGNOSIS — M4806 Spinal stenosis, lumbar region: Secondary | ICD-10-CM | POA: Diagnosis not present

## 2015-05-12 DIAGNOSIS — N183 Chronic kidney disease, stage 3 (moderate): Secondary | ICD-10-CM | POA: Diagnosis not present

## 2015-05-12 DIAGNOSIS — I5030 Unspecified diastolic (congestive) heart failure: Secondary | ICD-10-CM | POA: Diagnosis not present

## 2015-05-12 DIAGNOSIS — I10 Essential (primary) hypertension: Secondary | ICD-10-CM | POA: Diagnosis not present

## 2015-05-12 DIAGNOSIS — J449 Chronic obstructive pulmonary disease, unspecified: Secondary | ICD-10-CM | POA: Diagnosis not present

## 2015-05-12 DIAGNOSIS — I2510 Atherosclerotic heart disease of native coronary artery without angina pectoris: Secondary | ICD-10-CM | POA: Diagnosis not present

## 2015-05-12 DIAGNOSIS — M1991 Primary osteoarthritis, unspecified site: Secondary | ICD-10-CM | POA: Diagnosis not present

## 2015-05-12 DIAGNOSIS — R0602 Shortness of breath: Secondary | ICD-10-CM | POA: Diagnosis not present

## 2015-05-12 DIAGNOSIS — J45909 Unspecified asthma, uncomplicated: Secondary | ICD-10-CM | POA: Diagnosis not present

## 2015-05-12 DIAGNOSIS — I129 Hypertensive chronic kidney disease with stage 1 through stage 4 chronic kidney disease, or unspecified chronic kidney disease: Secondary | ICD-10-CM | POA: Diagnosis not present

## 2015-05-12 DIAGNOSIS — I714 Abdominal aortic aneurysm, without rupture: Secondary | ICD-10-CM | POA: Diagnosis not present

## 2015-05-12 DIAGNOSIS — E7800 Pure hypercholesterolemia, unspecified: Secondary | ICD-10-CM | POA: Diagnosis not present

## 2015-05-12 DIAGNOSIS — J9611 Chronic respiratory failure with hypoxia: Secondary | ICD-10-CM | POA: Diagnosis not present

## 2015-05-18 DIAGNOSIS — I714 Abdominal aortic aneurysm, without rupture: Secondary | ICD-10-CM | POA: Diagnosis not present

## 2015-05-18 DIAGNOSIS — R0602 Shortness of breath: Secondary | ICD-10-CM | POA: Diagnosis not present

## 2015-05-18 DIAGNOSIS — R609 Edema, unspecified: Secondary | ICD-10-CM | POA: Diagnosis not present

## 2015-05-18 DIAGNOSIS — I10 Essential (primary) hypertension: Secondary | ICD-10-CM | POA: Diagnosis not present

## 2015-05-18 DIAGNOSIS — I5030 Unspecified diastolic (congestive) heart failure: Secondary | ICD-10-CM | POA: Diagnosis not present

## 2015-05-21 ENCOUNTER — Encounter: Payer: Self-pay | Admitting: Emergency Medicine

## 2015-05-21 ENCOUNTER — Emergency Department
Admission: EM | Admit: 2015-05-21 | Discharge: 2015-05-21 | Disposition: A | Discharge status: Home / Self Care | Payer: Commercial Managed Care - HMO | Attending: Emergency Medicine | Admitting: Emergency Medicine

## 2015-05-21 DIAGNOSIS — Z791 Long term (current) use of non-steroidal anti-inflammatories (NSAID): Secondary | ICD-10-CM | POA: Insufficient documentation

## 2015-05-21 DIAGNOSIS — L03314 Cellulitis of groin: Secondary | ICD-10-CM | POA: Diagnosis not present

## 2015-05-21 DIAGNOSIS — Z85828 Personal history of other malignant neoplasm of skin: Secondary | ICD-10-CM | POA: Diagnosis not present

## 2015-05-21 DIAGNOSIS — Z87891 Personal history of nicotine dependence: Secondary | ICD-10-CM | POA: Diagnosis not present

## 2015-05-21 DIAGNOSIS — T8189XA Other complications of procedures, not elsewhere classified, initial encounter: Secondary | ICD-10-CM | POA: Diagnosis not present

## 2015-05-21 DIAGNOSIS — E039 Hypothyroidism, unspecified: Secondary | ICD-10-CM | POA: Diagnosis not present

## 2015-05-21 DIAGNOSIS — Z79899 Other long term (current) drug therapy: Secondary | ICD-10-CM | POA: Diagnosis not present

## 2015-05-21 DIAGNOSIS — E7800 Pure hypercholesterolemia, unspecified: Secondary | ICD-10-CM | POA: Insufficient documentation

## 2015-05-21 DIAGNOSIS — IMO0001 Reserved for inherently not codable concepts without codable children: Secondary | ICD-10-CM

## 2015-05-21 DIAGNOSIS — N19 Unspecified kidney failure: Secondary | ICD-10-CM | POA: Insufficient documentation

## 2015-05-21 DIAGNOSIS — R1031 Right lower quadrant pain: Secondary | ICD-10-CM | POA: Diagnosis present

## 2015-05-21 DIAGNOSIS — M810 Age-related osteoporosis without current pathological fracture: Secondary | ICD-10-CM | POA: Insufficient documentation

## 2015-05-21 DIAGNOSIS — T888XXA Other specified complications of surgical and medical care, not elsewhere classified, initial encounter: Secondary | ICD-10-CM | POA: Insufficient documentation

## 2015-05-21 DIAGNOSIS — Z7982 Long term (current) use of aspirin: Secondary | ICD-10-CM | POA: Diagnosis not present

## 2015-05-21 DIAGNOSIS — I2510 Atherosclerotic heart disease of native coronary artery without angina pectoris: Secondary | ICD-10-CM | POA: Insufficient documentation

## 2015-05-21 DIAGNOSIS — T814XXA Infection following a procedure, initial encounter: Secondary | ICD-10-CM

## 2015-05-21 DIAGNOSIS — Z7952 Long term (current) use of systemic steroids: Secondary | ICD-10-CM | POA: Diagnosis not present

## 2015-05-21 DIAGNOSIS — J45909 Unspecified asthma, uncomplicated: Secondary | ICD-10-CM | POA: Insufficient documentation

## 2015-05-21 DIAGNOSIS — Z8673 Personal history of transient ischemic attack (TIA), and cerebral infarction without residual deficits: Secondary | ICD-10-CM | POA: Diagnosis not present

## 2015-05-21 DIAGNOSIS — Y828 Other medical devices associated with adverse incidents: Secondary | ICD-10-CM | POA: Diagnosis not present

## 2015-05-21 DIAGNOSIS — I10 Essential (primary) hypertension: Secondary | ICD-10-CM | POA: Diagnosis not present

## 2015-05-21 DIAGNOSIS — Z7951 Long term (current) use of inhaled steroids: Secondary | ICD-10-CM | POA: Insufficient documentation

## 2015-05-21 DIAGNOSIS — J441 Chronic obstructive pulmonary disease with (acute) exacerbation: Secondary | ICD-10-CM | POA: Insufficient documentation

## 2015-05-21 MED ORDER — CEPHALEXIN 500 MG PO CAPS: 500.0000 mg | ORAL_CAPSULE | Freq: Three times a day (TID) | ORAL | Status: DC

## 2015-05-21 NOTE — Discharge Instructions (Signed)
Please continue to apply wet-to-dry dressings twice a day to the wound and keep it otherwise clean and dry.  Follow up with your doctor this week for continued monitoring of your wound.  Wound Infection A wound infection happens when a type of germ (bacteria) starts growing in the wound. In some cases, this can cause the wound to break open. If cared for properly, the infected wound will heal from the inside to the outside. Wound infections need treatment. CAUSES An infection is caused by bacteria growing in the wound.  SYMPTOMS   Increase in redness, swelling, or pain at the wound site.  Increase in drainage at the wound site.  Wound or bandage (dressing) starts to smell bad.  Fever.  Feeling tired or fatigued.  Pus draining from the wound. TREATMENT  Your health care provider will prescribe antibiotic medicine. The wound infection should improve within 24 to 48 hours. Any redness around the wound should stop spreading and the wound should be less painful.  HOME CARE INSTRUCTIONS   Only take over-the-counter or prescription medicines for pain, discomfort, or fever as directed by your health care provider.  Take your antibiotics as directed. Finish them even if you start to feel better.  Gently wash the area with mild soap and water 2 times a day, or as directed. Rinse off the soap. Pat the area dry with a clean towel. Do not rub the wound. This may cause bleeding.  Follow your health care provider's instructions for how often you need to change the dressing.  Apply ointment and a dressing to the wound as directed.  If the dressing sticks, moisten it with soapy water and gently remove it.  Change the bandage right away if it becomes wet, dirty, or develops a bad smell.  Take showers. Do not take tub baths, swim, or do anything that may soak the wound until it is healed.  Avoid exercises that make you sweat heavily.  Use anti-itch medicine as directed by your health care  provider. The wound may itch when it is healing. Do not pick or scratch at the wound.  Follow up with your health care provider to get your wound rechecked as directed. SEEK MEDICAL CARE IF:  You have an increase in swelling, pain, or redness around the wound.  You have an increase in the amount of pus coming from the wound.  There is a bad smell coming from the wound.  More of the wound breaks open.  You have a fever. MAKE SURE YOU:   Understand these instructions.  Will watch your condition.  Will get help right away if you are not doing well or get worse.   This information is not intended to replace advice given to you by your health care provider. Make sure you discuss any questions you have with your health care provider.   Document Released: 10/28/2002 Document Revised: 02/03/2013 Document Reviewed: 07/19/2014 Elsevier Interactive Patient Education [employer]2016 [employer]

## 2015-05-21 NOTE — ED Notes (Signed)
Pt to ed with c/o draining greenish/yellow at incision site from surgery on March 8th to increase blood flow to legs bilat.  Pt states stent placement.  Pt family reports area is red and warm to touch.

## 2015-05-21 NOTE — ED Provider Notes (Signed)
[facility] Emergency Department Provider Note  ____________________________________________  Time seen: 1:40 PM  I have reviewed the triage vital signs and the nursing notes.   HISTORY  Chief Complaint groin swelling pain post op     HPI ADALYNN CORNE is a 82 y.o. female who had vascular surgery on her bilateral inguinal area 1 month ago with stent placement. Everything is been doing well except for delayed closure and healing of the wound on the right inguinal area. However, 2 days ago she started having some increased redness to the surgical wound on the right inguinal area. She's noticed some increased exudative material in the wound and it seems more tender to the touch and warm. Denies fever chills chest pain shortness of breath abdominal pain nausea vomiting diarrhea dizziness syncope or fatigue. She otherwise feeling well and tolerating oral intake.     Past Medical History  Diagnosis Date  . Abdominal pain, epigastric   . Aneurysm of aorta (HCC)   . Osteoarthritis   . GERD (gastroesophageal reflux disease)   . Spinal stenosis of lumbar region without neurogenic claudication   . Insomnia   . Hypercholesteremia   . Osteoporosis   . Hypertension   . Stomatitis monilial   . CAD (coronary artery disease)   . Renal failure   . UTI symptoms   . Stroke (HCC)   . Allergic drug reaction   . Breathlessness on exertion   . Asthma   . COPD (chronic obstructive pulmonary disease) (HCC)   . Seborrheic keratoses   . Olecranon bursitis   . History of partial colectomy   . Pneumonia   . Hypoxia   . Nocturia   . Vaginal atrophy   . Hypothyroidism      Patient Active Problem List   Diagnosis Date Noted  . Ischemic leg 04/20/2015  . Cellulitis 02/08/2015  . COPD with acute exacerbation (HCC) 02/06/2015  . Atherosclerosis of coronary artery 11/11/2014  . Centriacinar emphysema (HCC) 11/11/2014  . CAFL (chronic airflow limitation) (HCC) 11/11/2014   . Polypharmacy 11/11/2014  . H/O hemicolectomy 11/11/2014  . Adult hypothyroidism 11/11/2014  . Hypoxia 11/11/2014  . Bursitis of elbow 11/11/2014  . Degenerative arthritis of hip 11/11/2014  . Basal cell papilloma 11/11/2014  . H/O respiratory system disease 07/29/2014  . Inflammation of sacroiliac joint (HCC) 09/29/2013  . AA (aortic aneurysm) (HCC) 07/19/2009  . Arthritis of hand, degenerative 11/12/2008  . Acid reflux 11/27/2007  . Lumbar canal stenosis 09/16/2007  . Hypercholesteremia 01/31/2007  . BP (high blood pressure) 11/20/2005  . OP (osteoporosis) 11/20/2005     Past Surgical History  Procedure Laterality Date  . Multilevel spinal fusion and laminectomy  2010    Due to spinal stenosis Dr. Gerrit Heck  . Rotator cuff surgery  2007  . Vaginal hysterectomy  1960  . Wrist surgery      lump removed due to pain  . Colon surgery      Hole in colon that was repaired  . Appendectomy    . Peripheral vascular catheterization N/A 03/16/2015    Procedure: Abdominal Aortogram w/Lower Extremity;  Surgeon: Renford Dills, MD;  Location: [facility] INVASIVE CV LAB;  Service: Cardiovascular;  Laterality: N/A;  . Peripheral vascular catheterization  03/16/2015    Procedure: Lower Extremity Intervention;  Surgeon: Renford Dills, MD;  Location: [facility] INVASIVE CV LAB;  Service: Cardiovascular;;  . Peripheral vascular catheterization Bilateral 04/20/2015    Procedure: Endovascular Repair/Stent Graft;  Surgeon: Latina Craver  Schnier, MD;  Location: [facility] INVASIVE CV LAB;  Service: Cardiovascular;  Laterality: Bilateral;     Current Outpatient Rx  Name  Route  Sig  Dispense  Refill  . acetaminophen (TYLENOL) 325 MG tablet   Oral   Take 1-2 tablets (325-650 mg total) by mouth every 4 (four) hours as needed for mild pain (or temp >/= 101 F).         Marland Kitchen albuterol (PROVENTIL HFA;VENTOLIN HFA) 108 (90 Base) MCG/ACT inhaler   Inhalation   Inhale 2 puffs into the lungs every 6 (six) hours as needed for  wheezing or shortness of breath.   3 Inhaler   4   . albuterol (PROVENTIL) (2.5 MG/3ML) 0.083% nebulizer solution   Nebulization   Take 2.5 mg by nebulization every 6 (six) hours as needed for wheezing or shortness of breath. Pt mixes with her Atrovent nebulizer solution.         Marland Kitchen aspirin EC 81 MG tablet   Oral   Take 81 mg by mouth daily at 12 noon.          . cephALEXin (KEFLEX) 500 MG capsule   Oral   Take 1 capsule (500 mg total) by mouth 3 (three) times daily.   21 capsule   0   . cholecalciferol (VITAMIN D) 1000 UNITS tablet   Oral   Take 1,000 Units by mouth daily.          . clopidogrel (PLAVIX) 75 MG tablet   Oral   Take 1 tablet (75 mg total) by mouth daily.         . diclofenac sodium (VOLTAREN) 1 % GEL   Topical   Apply 2 g topically 4 (four) times daily as needed (for pain).         . ferrous sulfate 325 (65 FE) MG tablet   Oral   Take 325 mg by mouth daily.          . fexofenadine (ALLEGRA) 180 MG tablet   Oral   Take 180 mg by mouth daily.         . Fluticasone Furoate-Vilanterol (BREO ELLIPTA) 200-25 MCG/INH AEPB   Inhalation   Inhale 1 puff into the lungs daily.         Marland Kitchen ipratropium (ATROVENT) 0.02 % nebulizer solution   Nebulization   Take 2.5 mLs (0.5 mg total) by nebulization every 6 (six) hours as needed for wheezing or shortness of breath. Pt mixes with her albuterol nebulizer solution.   75 mL   12   . isosorbide mononitrate (IMDUR) 30 MG 24 hr tablet   Oral   Take 30 mg by mouth daily.          Marland Kitchen ketorolac (TORADOL) 10 MG tablet   Oral   Take 1 tablet (10 mg total) by mouth every 6 (six) hours as needed for moderate pain.   20 tablet   0   . lansoprazole (PREVACID) 30 MG capsule   Oral   Take 30 mg by mouth daily.          . lansoprazole (PREVACID) 30 MG capsule   Oral   Take 1 capsule (30 mg total) by mouth daily.         Marland Kitchen levothyroxine (SYNTHROID, LEVOTHROID) 75 MCG tablet   Oral   Take 75 mcg by  mouth daily before breakfast.         . Magnesium Oxide 500 MG TABS   Oral   Take 500  mg by mouth daily.          . meloxicam (MOBIC) 15 MG tablet      TAKE ONE TABLET BY MOUTH ONCE DAILY FOR ARTHRITIS   30 tablet   0   . multivitamin-lutein (OCUVITE-LUTEIN) CAPS capsule   Oral   Take 1 capsule by mouth daily.          . nitroGLYCERIN (NITROSTAT) 0.4 MG SL tablet   Sublingual   Place 0.4 mg under the tongue every 5 (five) minutes as needed for chest pain.         . Omega-3 Fatty Acids (FISH OIL) 1000 MG CAPS   Oral   Take 1,000 mg by mouth daily.          Marland Kitchen POTASSIUM PO   Oral   Take 1 tablet by mouth daily.          . predniSONE (DELTASONE) 10 MG tablet   Oral   Take 1 tablet (10 mg total) by mouth daily with breakfast. RESTART THIS AFTER FINISHING OFF THE PREDNISONE TAPER IN 12 DAYS   30 tablet   0   . sulfamethoxazole-trimethoprim (BACTRIM DS,SEPTRA DS) 800-160 MG tablet   Oral   Take 1 tablet by mouth 2 (two) times daily.   6 tablet   0   . vitamin C (ASCORBIC ACID) 500 MG tablet   Oral   Take 500 mg by mouth daily.             Allergies Nitrofurantoin monohyd macro; Levaquin; Codeine; Crestor; Duratuss g; Oxycodone-acetaminophen; Propoxyphene; Tramadol; and Morphine   Family History  Problem Relation Age of Onset  . COPD Sister   . Heart disease Brother   . Hypertension Brother   . Diabetes Brother     Social History Social History  Substance Use Topics  . Smoking status: Former Games developer  . Smokeless tobacco: Never Used     Comment: quit 11 years ago  . Alcohol Use: 0.0 oz/week    0 Standard drinks or equivalent per week     Comment: 2 oz of wine at night    Review of Systems  Constitutional:   No fever or chills. No weight changes Eyes:   No vision changes.  ENT:   No sore throat. No rhinorrhea. Cardiovascular:   No chest pain. Respiratory:   No dyspnea or cough. Gastrointestinal:   Negative for abdominal pain, vomiting  and diarrhea.  No BRBPR or melena. Genitourinary:   Negative for dysuria or difficulty urinating. Musculoskeletal:   Negative for focal pain or swelling Skin:   Persistent surgical wound right inguinal area with redness and drainage Neurological:   Negative for headaches, focal weakness or numbness.  10-point ROS otherwise negative.  ____________________________________________   PHYSICAL EXAM:  VITAL SIGNS: ED Triage Vitals  Enc Vitals Group     BP 05/21/15 1244 113/47 mmHg     Pulse Rate 05/21/15 1244 80     Resp 05/21/15 1244 18     Temp 05/21/15 1244 98.2 F (36.8 C)     Temp Source 05/21/15 1244 Oral     SpO2 05/21/15 1244 96 %     Weight 05/21/15 1244 135 lb (61.236 kg)     Height --      Head Cir --      Peak Flow --      Pain Score 05/21/15 1245 0     Pain Loc --      Pain Edu? --  Excl. in GC? --     Vital signs reviewed, nursing assessments reviewed.   Constitutional:   Alert and oriented. Well appearing and in no distress. Eyes:   No scleral icterus. No conjunctival pallor. PERRL. EOMI Hematological/Lymphatic/Immunilogical:   No cervical lymphadenopathy. Cardiovascular:   RRR. Symmetric bilateral radial and DP pulses.  No murmurs. 1+ pulse in bilateral popliteal and DP pulses. PT pulse present in the right ankle as well. Bilateral feet are warm with normal capillary refill.  Respiratory:   Normal respiratory effort without tachypnea nor retractions. Breath sounds are clear and equal bilaterally. No wheezes/rales/rhonchi. Gastrointestinal:   Soft and nontender. Non distended. There is no CVA tenderness.  No rebound, rigidity, or guarding. Genitourinary:   deferred Musculoskeletal:   Nontender with normal range of motion in all extremities. No joint effusions.  No lower extremity tenderness.  No edema. Neurologic:   Normal speech and language.  CN 2-10 normal. Motor grossly intact. No gross focal neurologic deficits are appreciated.  Skin:    Skin is  warm, dry.  Incision along the left inguinal area is well healed, nontender, no inflammation. Incisional area along the right inguinal area has a 2-3 cm shallow open wound in the subcutaneous soft tissue. There is fibrinous exudate in the wound but no purulent discharge. No necrotic material. No fluctuance or fluid collection. There is some erythema and slight warmth and slight tenderness to touch. Good symmetric palpable femoral pulses. No distal stigmata of embolic disease.  Psychiatric:   Mood and affect are normal. ____________________________________________    LABS (pertinent positives/negatives) (all labs ordered are listed, but only abnormal results are displayed) Labs Reviewed - No data to display ____________________________________________   EKG    ____________________________________________    RADIOLOGY    ____________________________________________   PROCEDURES   ____________________________________________   INITIAL IMPRESSION / ASSESSMENT AND PLAN / ED COURSE  Pertinent labs & imaging results that were available during my care of the patient were reviewed by me and considered in my medical decision making (see chart for details).  Patient complains of worsening redness and pain around her right inguinal surgical incision. No evidence of any vascular compromise. The wound appears to have a very mild early cellulitis and is otherwise shallow without any undermining or tracking and limited to the subcutaneous soft tissue. Her vital signs are unremarkable and she is otherwise well-appearing. We'll start her on oral Keflex and have her do wet-to-dry dressings and follow-up with her doctor this week.     ____________________________________________   FINAL CLINICAL IMPRESSION(S) / ED DIAGNOSES  Final diagnoses:  Delayed surgical wound healing, initial encounter  Postoperative cellulitis of surgical wound, initial encounter      Sharman CheekPhillip Benicia Bergevin,  MD 05/21/15 1419

## 2015-05-21 NOTE — ED Notes (Signed)
Patient states she noticed draining to right groin 2 days ago.  Patient has not notified MD that did the procedure.  Dr. Eliseo SquiresSchneer performed procedure.  Wound noticed to right groin, moderate amount of purulent drainage noted.

## 2015-05-24 ENCOUNTER — Other Ambulatory Visit: Payer: Self-pay | Admitting: Vascular Surgery

## 2015-05-25 ENCOUNTER — Encounter
Admission: RE | Admit: 2015-05-25 | Discharge: 2015-05-25 | Disposition: A | Discharge status: Home / Self Care | Payer: Commercial Managed Care - HMO | Source: Ambulatory Visit | Attending: Vascular Surgery | Admitting: Vascular Surgery

## 2015-05-25 ENCOUNTER — Inpatient Hospital Stay: Admission: RE | Admit: 2015-05-25 | Payer: Commercial Managed Care - HMO | Source: Ambulatory Visit

## 2015-05-25 DIAGNOSIS — I70213 Atherosclerosis of native arteries of extremities with intermittent claudication, bilateral legs: Secondary | ICD-10-CM | POA: Diagnosis not present

## 2015-05-25 DIAGNOSIS — Z9841 Cataract extraction status, right eye: Secondary | ICD-10-CM | POA: Diagnosis not present

## 2015-05-25 DIAGNOSIS — E7800 Pure hypercholesterolemia, unspecified: Secondary | ICD-10-CM | POA: Diagnosis not present

## 2015-05-25 DIAGNOSIS — Z888 Allergy status to other drugs, medicaments and biological substances status: Secondary | ICD-10-CM | POA: Diagnosis not present

## 2015-05-25 DIAGNOSIS — T814XXA Infection following a procedure, initial encounter: Secondary | ICD-10-CM | POA: Diagnosis not present

## 2015-05-25 DIAGNOSIS — N289 Disorder of kidney and ureter, unspecified: Secondary | ICD-10-CM | POA: Diagnosis not present

## 2015-05-25 DIAGNOSIS — B999 Unspecified infectious disease: Secondary | ICD-10-CM | POA: Diagnosis not present

## 2015-05-25 DIAGNOSIS — Z8249 Family history of ischemic heart disease and other diseases of the circulatory system: Secondary | ICD-10-CM | POA: Diagnosis not present

## 2015-05-25 DIAGNOSIS — L821 Other seborrheic keratosis: Secondary | ICD-10-CM | POA: Diagnosis not present

## 2015-05-25 DIAGNOSIS — M1990 Unspecified osteoarthritis, unspecified site: Secondary | ICD-10-CM | POA: Diagnosis not present

## 2015-05-25 DIAGNOSIS — I10 Essential (primary) hypertension: Secondary | ICD-10-CM | POA: Diagnosis not present

## 2015-05-25 DIAGNOSIS — K219 Gastro-esophageal reflux disease without esophagitis: Secondary | ICD-10-CM | POA: Diagnosis not present

## 2015-05-25 DIAGNOSIS — G4700 Insomnia, unspecified: Secondary | ICD-10-CM | POA: Diagnosis not present

## 2015-05-25 DIAGNOSIS — I2510 Atherosclerotic heart disease of native coronary artery without angina pectoris: Secondary | ICD-10-CM | POA: Diagnosis not present

## 2015-05-25 DIAGNOSIS — I8390 Asymptomatic varicose veins of unspecified lower extremity: Secondary | ICD-10-CM | POA: Diagnosis not present

## 2015-05-25 DIAGNOSIS — R0602 Shortness of breath: Secondary | ICD-10-CM | POA: Diagnosis not present

## 2015-05-25 DIAGNOSIS — Z90710 Acquired absence of both cervix and uterus: Secondary | ICD-10-CM | POA: Diagnosis not present

## 2015-05-25 DIAGNOSIS — X58XXXA Exposure to other specified factors, initial encounter: Secondary | ICD-10-CM | POA: Diagnosis not present

## 2015-05-25 DIAGNOSIS — Z9842 Cataract extraction status, left eye: Secondary | ICD-10-CM | POA: Diagnosis not present

## 2015-05-25 DIAGNOSIS — Z87891 Personal history of nicotine dependence: Secondary | ICD-10-CM | POA: Diagnosis not present

## 2015-05-25 DIAGNOSIS — J449 Chronic obstructive pulmonary disease, unspecified: Secondary | ICD-10-CM | POA: Diagnosis not present

## 2015-05-25 DIAGNOSIS — Z885 Allergy status to narcotic agent status: Secondary | ICD-10-CM | POA: Diagnosis not present

## 2015-05-25 DIAGNOSIS — E039 Hypothyroidism, unspecified: Secondary | ICD-10-CM | POA: Diagnosis not present

## 2015-05-25 DIAGNOSIS — Z8673 Personal history of transient ischemic attack (TIA), and cerebral infarction without residual deficits: Secondary | ICD-10-CM | POA: Diagnosis not present

## 2015-05-25 DIAGNOSIS — Z9981 Dependence on supplemental oxygen: Secondary | ICD-10-CM | POA: Diagnosis not present

## 2015-05-25 LAB — CBC WITH DIFFERENTIAL/PLATELET
Basophils Absolute: 0.1 10*3/uL (ref 0–0.1)
Basophils Relative: 1 %
EOS ABS: 0.2 10*3/uL (ref 0–0.7)
EOS PCT: 3 %
HCT: 33.7 % — ABNORMAL LOW (ref 35.0–47.0)
HEMOGLOBIN: 11.4 g/dL — AB (ref 12.0–16.0)
LYMPHS ABS: 1.6 10*3/uL (ref 1.0–3.6)
LYMPHS PCT: 20 %
MCH: 31.8 pg (ref 26.0–34.0)
MCHC: 33.8 g/dL (ref 32.0–36.0)
MCV: 94.1 fL (ref 80.0–100.0)
MONOS PCT: 10 %
Monocytes Absolute: 0.8 10*3/uL (ref 0.2–0.9)
NEUTROS PCT: 66 %
Neutro Abs: 5.3 10*3/uL (ref 1.4–6.5)
Platelets: 292 10*3/uL (ref 150–440)
RBC: 3.58 MIL/uL — AB (ref 3.80–5.20)
RDW: 13.6 % (ref 11.5–14.5)
WBC: 8.1 10*3/uL (ref 3.6–11.0)

## 2015-05-25 LAB — PROTIME-INR
INR: 0.98
Prothrombin Time: 13.2 seconds (ref 11.4–15.0)

## 2015-05-25 LAB — BASIC METABOLIC PANEL
Anion gap: 9 (ref 5–15)
BUN: 24 mg/dL — AB (ref 6–20)
CHLORIDE: 98 mmol/L — AB (ref 101–111)
CO2: 31 mmol/L (ref 22–32)
CREATININE: 1.14 mg/dL — AB (ref 0.44–1.00)
Calcium: 8.8 mg/dL — ABNORMAL LOW (ref 8.9–10.3)
GFR calc Af Amer: 49 mL/min — ABNORMAL LOW (ref >60)
GFR calc non Af Amer: 42 mL/min — ABNORMAL LOW (ref >60)
Glucose, Bld: 120 mg/dL — ABNORMAL HIGH (ref 65–99)
POTASSIUM: 3.5 mmol/L (ref 3.5–5.1)
SODIUM: 138 mmol/L (ref 135–145)

## 2015-05-25 LAB — TYPE AND SCREEN
ABO/RH(D): O POS
ANTIBODY SCREEN: NEGATIVE

## 2015-05-25 LAB — APTT: aPTT: 25 seconds (ref 24–36)

## 2015-05-25 LAB — SURGICAL PCR SCREEN
MRSA, PCR: NEGATIVE
STAPHYLOCOCCUS AUREUS: NEGATIVE

## 2015-05-25 NOTE — Patient Instructions (Addendum)
  Your procedure is scheduled on: May 26, 2015 (Thursday) Report to Day Surgery.([facility]) SECOND FLOOR To find out your arrival time please call [phone] between 1PM - 3PM on ARRIVAL TIME 12:00 NOON.  Remember: Instructions that are not followed completely may result in serious medical risk, up to and including death, or upon the discretion of your surgeon and anesthesiologist your surgery may need to be rescheduled.    __x__ 1. Do not eat food or drink liquids after midnight. No gum chewing or hard candies.     __x__ 2. No Alcohol for 24 hours before or after surgery.   ____ 3. Bring all medications with you on the day of surgery if instructed.    __x__ 4. Notify your doctor if there is any change in your medical condition     (cold, fever, infections).     Do not wear jewelry, make-up, hairpins, clips or nail polish.  Do not wear lotions, powders, or perfumes. You may wear deodorant.  Do not shave 48 hours prior to surgery. Men may shave face and neck.  Do not bring valuables to the hospital.    [facility] any belongings or valuables.               Contacts, dentures or bridgework may not be worn into surgery.  Leave your suitcase in the car. After surgery it may be brought to your room.  For patients admitted to the hospital, discharge time is determined by your                treatment team.   Patients discharged the day of surgery will not be allowed to drive home.   Please read over the following fact sheets that you were given:   MRSA Information and Surgical Site Infection Prevention   __x__ Take these medicines the morning of surgery with A SIP OF WATER:    1. Carvedilol  2. Prednisone  3. Prevacid (Prevacid at bedtime tonight)  4.  5.  6.  ____ Fleet Enema (as directed)   _x___ Use CHG Soap as directed  __x__ Use inhalers on the day of surgery (USE ALBUTEROL NEBULIZER AND BREO INHALER THE MORNING OF SURGERY,AND BRING BREO AND  ALBUTEROL INHALERS TO HOSPITAL)  ____ Stop metformin 2 days prior to surgery    ____ Take 1/2 of usual insulin dose the night before surgery and none on the morning of surgery.   __x__ Stop Coumadin/Plavix/aspirin on (DO NOT TAKE PLAVIX OR ASPIRIN  IN AM)  __x_ Stop Anti-inflammatories on (STOP MELOXICAM NOW) DO NOT TAKE TOMORROW AM   __x__ Stop supplements until after surgery.  (STOP FISH OIL AND VITAMIN C NOW)  ____ Bring C-Pap to the hospital.

## 2015-05-26 ENCOUNTER — Ambulatory Visit: Payer: Commercial Managed Care - HMO | Admitting: Anesthesiology

## 2015-05-26 ENCOUNTER — Encounter
Admission: RE | Disposition: A | Discharge status: Home / Self Care | Payer: Self-pay | Source: Ambulatory Visit | Attending: Vascular Surgery

## 2015-05-26 ENCOUNTER — Ambulatory Visit
Admission: RE | Admit: 2015-05-26 | Discharge: 2015-05-26 | Disposition: A | Discharge status: Home / Self Care | Payer: Commercial Managed Care - HMO | Source: Ambulatory Visit | Attending: Vascular Surgery | Admitting: Vascular Surgery

## 2015-05-26 DIAGNOSIS — I739 Peripheral vascular disease, unspecified: Secondary | ICD-10-CM | POA: Diagnosis not present

## 2015-05-26 DIAGNOSIS — I2510 Atherosclerotic heart disease of native coronary artery without angina pectoris: Secondary | ICD-10-CM | POA: Diagnosis not present

## 2015-05-26 DIAGNOSIS — B999 Unspecified infectious disease: Secondary | ICD-10-CM | POA: Insufficient documentation

## 2015-05-26 DIAGNOSIS — G4700 Insomnia, unspecified: Secondary | ICD-10-CM | POA: Insufficient documentation

## 2015-05-26 DIAGNOSIS — J45909 Unspecified asthma, uncomplicated: Secondary | ICD-10-CM | POA: Diagnosis not present

## 2015-05-26 DIAGNOSIS — Z9841 Cataract extraction status, right eye: Secondary | ICD-10-CM | POA: Insufficient documentation

## 2015-05-26 DIAGNOSIS — Z8673 Personal history of transient ischemic attack (TIA), and cerebral infarction without residual deficits: Secondary | ICD-10-CM | POA: Insufficient documentation

## 2015-05-26 DIAGNOSIS — Z90710 Acquired absence of both cervix and uterus: Secondary | ICD-10-CM | POA: Insufficient documentation

## 2015-05-26 DIAGNOSIS — I70223 Atherosclerosis of native arteries of extremities with rest pain, bilateral legs: Secondary | ICD-10-CM | POA: Diagnosis not present

## 2015-05-26 DIAGNOSIS — R0602 Shortness of breath: Secondary | ICD-10-CM | POA: Insufficient documentation

## 2015-05-26 DIAGNOSIS — Z888 Allergy status to other drugs, medicaments and biological substances status: Secondary | ICD-10-CM | POA: Diagnosis not present

## 2015-05-26 DIAGNOSIS — J449 Chronic obstructive pulmonary disease, unspecified: Secondary | ICD-10-CM | POA: Diagnosis not present

## 2015-05-26 DIAGNOSIS — M1990 Unspecified osteoarthritis, unspecified site: Secondary | ICD-10-CM | POA: Diagnosis not present

## 2015-05-26 DIAGNOSIS — L821 Other seborrheic keratosis: Secondary | ICD-10-CM | POA: Insufficient documentation

## 2015-05-26 DIAGNOSIS — Z885 Allergy status to narcotic agent status: Secondary | ICD-10-CM | POA: Insufficient documentation

## 2015-05-26 DIAGNOSIS — K219 Gastro-esophageal reflux disease without esophagitis: Secondary | ICD-10-CM | POA: Insufficient documentation

## 2015-05-26 DIAGNOSIS — I8390 Asymptomatic varicose veins of unspecified lower extremity: Secondary | ICD-10-CM | POA: Diagnosis not present

## 2015-05-26 DIAGNOSIS — Z9842 Cataract extraction status, left eye: Secondary | ICD-10-CM | POA: Insufficient documentation

## 2015-05-26 DIAGNOSIS — I70213 Atherosclerosis of native arteries of extremities with intermittent claudication, bilateral legs: Secondary | ICD-10-CM | POA: Diagnosis not present

## 2015-05-26 DIAGNOSIS — Z8249 Family history of ischemic heart disease and other diseases of the circulatory system: Secondary | ICD-10-CM | POA: Insufficient documentation

## 2015-05-26 DIAGNOSIS — I10 Essential (primary) hypertension: Secondary | ICD-10-CM | POA: Diagnosis not present

## 2015-05-26 DIAGNOSIS — T814XXA Infection following a procedure, initial encounter: Secondary | ICD-10-CM | POA: Insufficient documentation

## 2015-05-26 DIAGNOSIS — N289 Disorder of kidney and ureter, unspecified: Secondary | ICD-10-CM | POA: Insufficient documentation

## 2015-05-26 DIAGNOSIS — E7800 Pure hypercholesterolemia, unspecified: Secondary | ICD-10-CM | POA: Insufficient documentation

## 2015-05-26 DIAGNOSIS — Z87891 Personal history of nicotine dependence: Secondary | ICD-10-CM | POA: Insufficient documentation

## 2015-05-26 DIAGNOSIS — X58XXXA Exposure to other specified factors, initial encounter: Secondary | ICD-10-CM | POA: Insufficient documentation

## 2015-05-26 DIAGNOSIS — L03314 Cellulitis of groin: Secondary | ICD-10-CM | POA: Diagnosis not present

## 2015-05-26 DIAGNOSIS — E039 Hypothyroidism, unspecified: Secondary | ICD-10-CM | POA: Insufficient documentation

## 2015-05-26 DIAGNOSIS — M79609 Pain in unspecified limb: Secondary | ICD-10-CM | POA: Diagnosis not present

## 2015-05-26 DIAGNOSIS — L089 Local infection of the skin and subcutaneous tissue, unspecified: Secondary | ICD-10-CM | POA: Diagnosis not present

## 2015-05-26 DIAGNOSIS — E785 Hyperlipidemia, unspecified: Secondary | ICD-10-CM | POA: Diagnosis not present

## 2015-05-26 DIAGNOSIS — Z9981 Dependence on supplemental oxygen: Secondary | ICD-10-CM | POA: Insufficient documentation

## 2015-05-26 HISTORY — PX: WOUND DEBRIDEMENT: SHX247

## 2015-05-26 HISTORY — PX: APPLICATION OF WOUND VAC: SHX5189

## 2015-05-26 SURGERY — DEBRIDEMENT, WOUND
Anesthesia: Monitor Anesthesia Care | Laterality: Right

## 2015-05-26 MED ORDER — BUPIVACAINE HCL 0.25 % IJ SOLN
INTRAMUSCULAR | Status: DC | PRN
  Administered 2015-05-26: 10 mL

## 2015-05-26 MED ORDER — LIDOCAINE HCL (PF) 1 % IJ SOLN
INTRAMUSCULAR | Status: AC
  Filled 2015-05-26: qty 30

## 2015-05-26 MED ORDER — LACTATED RINGERS IV SOLN
INTRAVENOUS | Status: DC | PRN
  Administered 2015-05-26: 14:00:00 via INTRAVENOUS

## 2015-05-26 MED ORDER — BUPIVACAINE HCL (PF) 0.25 % IJ SOLN
INTRAMUSCULAR | Status: AC
  Filled 2015-05-26: qty 30

## 2015-05-26 MED ORDER — FENTANYL CITRATE (PF) 100 MCG/2ML IJ SOLN: 25.0000 ug | INTRAMUSCULAR | Status: DC | PRN

## 2015-05-26 MED ORDER — LACTATED RINGERS IV SOLN
INTRAVENOUS | Status: DC
  Administered 2015-05-26: 13:00:00 via INTRAVENOUS

## 2015-05-26 MED ORDER — CEFAZOLIN SODIUM-DEXTROSE 2-4 GM/100ML-% IV SOLN
2.0000 g | INTRAVENOUS | Status: AC
  Administered 2015-05-26: 2 g via INTRAVENOUS

## 2015-05-26 MED ORDER — BUPIVACAINE HCL (PF) 0.5 % IJ SOLN
INTRAMUSCULAR | Status: AC
  Filled 2015-05-26: qty 30

## 2015-05-26 MED ORDER — HYDROCODONE-ACETAMINOPHEN 5-325 MG PO TABS: 1.0000 | ORAL_TABLET | Freq: Four times a day (QID) | ORAL | Status: DC | PRN

## 2015-05-26 MED ORDER — PROPOFOL 10 MG/ML IV BOLUS
INTRAVENOUS | Status: DC | PRN
  Administered 2015-05-26: 20 mg via INTRAVENOUS

## 2015-05-26 MED ORDER — CEFAZOLIN SODIUM-DEXTROSE 2-4 GM/100ML-% IV SOLN
INTRAVENOUS | Status: AC
  Administered 2015-05-26: 2 g via INTRAVENOUS
  Filled 2015-05-26: qty 100

## 2015-05-26 SURGICAL SUPPLY — 36 items
AVANCE NEGATIVE PRESSURE WOUND THERAPY SMALL ×3 IMPLANT
BRUSH SCRUB 4% CHG (MISCELLANEOUS) ×3 IMPLANT
CANISTER SUCT 1200ML W/VALVE (MISCELLANEOUS) ×3 IMPLANT
CHLORAPREP W/TINT 26ML (MISCELLANEOUS) IMPLANT
DRAPE INCISE IOBAN 66X45 STRL (DRAPES) IMPLANT
DRAPE LAPAROTOMY T 102X78X121 (DRAPES) ×3 IMPLANT
DRSG VAC ATS MED SENSATRAC (GAUZE/BANDAGES/DRESSINGS) IMPLANT
ELECT CAUTERY BLADE 6.4 (BLADE) ×3 IMPLANT
ELECT REM PT RETURN 9FT ADLT (ELECTROSURGICAL) ×3
ELECTRODE REM PT RTRN 9FT ADLT (ELECTROSURGICAL) ×1 IMPLANT
GLOVE BIO SURGEON STRL SZ7 (GLOVE) ×3 IMPLANT
GOWN STRL REUS W/ TWL LRG LVL3 (GOWN DISPOSABLE) ×1 IMPLANT
GOWN STRL REUS W/ TWL XL LVL3 (GOWN DISPOSABLE) ×1 IMPLANT
GOWN STRL REUS W/TWL LRG LVL3 (GOWN DISPOSABLE) ×2
GOWN STRL REUS W/TWL XL LVL3 (GOWN DISPOSABLE) ×2
HANDPIECE SUCTION TUBG SURGILV (MISCELLANEOUS) ×3 IMPLANT
IV NS 1000ML (IV SOLUTION) ×2
IV NS 1000ML BAXH (IV SOLUTION) ×1 IMPLANT
KIT RM TURNOVER STRD PROC AR (KITS) ×3 IMPLANT
LABEL OR SOLS (LABEL) ×3 IMPLANT
NS IRRIG 1000ML POUR BTL (IV SOLUTION) IMPLANT
NS IRRIG 500ML POUR BTL (IV SOLUTION) ×3 IMPLANT
PACK BASIN MINOR ARMC (MISCELLANEOUS) ×3 IMPLANT
PACK EXTREMITY ARMC (MISCELLANEOUS) ×3 IMPLANT
PAD PREP 24X41 OB/GYN DISP (PERSONAL CARE ITEMS) ×3 IMPLANT
SOL PREP PVP 2OZ (MISCELLANEOUS) ×3
SOLUTION PREP PVP 2OZ (MISCELLANEOUS) ×1 IMPLANT
SPONGE LAP 18X18 5 PK (GAUZE/BANDAGES/DRESSINGS) ×3 IMPLANT
SUT ETHILON 4-0 (SUTURE) ×2
SUT ETHILON 4-0 FS2 18XMFL BLK (SUTURE) ×1
SUT VIC AB 3-0 SH 27 (SUTURE) ×4
SUT VIC AB 3-0 SH 27X BRD (SUTURE) ×2 IMPLANT
SUTURE ETHLN 4-0 FS2 18XMF BLK (SUTURE) ×1 IMPLANT
SWAB CULTURE AMIES ANAERIB BLU (MISCELLANEOUS) ×3 IMPLANT
SYR BULB EAR ULCER 3OZ GRN STR (SYRINGE) ×3 IMPLANT
WND VAC CANISTER 500ML (MISCELLANEOUS) ×3 IMPLANT

## 2015-05-26 NOTE — OR Nursing (Signed)
Dr Wyn Quakerew notified of Patient allergy to penicillin order received to continue with Ancef.

## 2015-05-26 NOTE — Anesthesia Preprocedure Evaluation (Signed)
Anesthesia Evaluation  Patient identified by MRN, date of birth, ID band Patient awake    Reviewed: Allergy & Precautions, H&P , NPO status , Patient's Chart, lab work & pertinent test results  History of Anesthesia Complications Negative for: history of anesthetic complications  Airway Mallampati: III  TM Distance: >3 FB Neck ROM: limited    Dental  (+) Poor Dentition, Chipped, Missing   Pulmonary shortness of breath, asthma , pneumonia, COPD,  COPD inhaler and oxygen dependent, former smoker,    Pulmonary exam normal breath sounds clear to auscultation       Cardiovascular Exercise Tolerance: Poor hypertension, (-) angina+ CAD, + Peripheral Vascular Disease and + DOE  (-) Past MI Normal cardiovascular exam Rhythm:regular Rate:Normal     Neuro/Psych CVA negative neurological ROS  negative psych ROS   GI/Hepatic Neg liver ROS, GERD  Controlled,  Endo/Other  Hypothyroidism   Renal/GU Renal disease  negative genitourinary   Musculoskeletal  (+) Arthritis ,   Abdominal   Peds  Hematology negative hematology ROS (+)   Anesthesia Other Findings Past Medical History:   Abdominal pain, epigastric                                   Aneurysm of aorta (HCC)                                      Osteoarthritis                                               GERD (gastroesophageal reflux disease)                       Spinal stenosis of lumbar region without neuro*              Insomnia                                                     Hypercholesteremia                                           Osteoporosis                                                 Hypertension                                                 Stomatitis monilial                                          CAD (coronary artery  disease)                                Renal failure                                                UTI symptoms                                                  Stroke [facility])                                                 Allergic drug reaction                                       Breathlessness on exertion                                   Asthma                                                       COPD (chronic obstructive pulmonary disease) (*              Seborrheic keratoses                                         Olecranon bursitis                                           History of partial colectomy                                 Pneumonia                                                    Hypoxia                                                      Nocturia  Vaginal atrophy                                              Hypothyroidism                                              Past Surgical History:   Multilevel spinal fusion and laminectomy         2010           Comment:Due to spinal stenosis Dr. Gerrit Heckaliff   Rotator cuff surgery                             2007         VAGINAL HYSTERECTOMY                             1960         WRIST SURGERY                                                   Comment:lump removed due to pain   COLON SURGERY                                                   Comment:Hole in colon that was repaired   APPENDECTOMY                                                  PERIPHERAL VASCULAR CATHETERIZATION             N/A 03/16/2015       Comment:Procedure: Abdominal Aortogram w/Lower               Extremity;  Surgeon: Renford DillsGregory G Schnier, MD;                Location: [facility] INVASIVE CV LAB;  Service:               Cardiovascular;  Laterality: N/A;   PERIPHERAL VASCULAR CATHETERIZATION              03/16/2015       Comment:Procedure: Lower Extremity Intervention;                Surgeon: Renford DillsGregory G Schnier, MD;  Location: [facility]              INVASIVE CV LAB;  Service: Cardiovascular;;   PERIPHERAL VASCULAR CATHETERIZATION              Bilateral 04/20/2015       Comment:Procedure: Endovascular Repair/Stent Graft;                Surgeon: Renford DillsGregory G Schnier, MD;  [facility];  Service: Cardiovascular;                Laterality: Bilateral;   BACK SURGERY                                                  EYE SURGERY                                     Bilateral                Comment:Cataract extraction with IOL  BMI    Body Mass Index   25.38 kg/m 2      Reproductive/Obstetrics negative OB ROS                             Anesthesia Physical Anesthesia Plan  ASA: III  Anesthesia Plan: MAC, General LMA and General   Post-op Pain Management:    Induction:   Airway Management Planned:   Additional Equipment:   Intra-op Plan:   Post-operative Plan:   Informed Consent: I have reviewed the patients History and Physical, chart, labs and discussed the procedure including the risks, benefits and alternatives for the proposed anesthesia with the patient or authorized representative who has indicated his/her understanding and acceptance.   Dental Advisory Given  Plan Discussed with: Anesthesiologist, CRNA and Surgeon  Anesthesia Plan Comments:         Anesthesia Quick Evaluation

## 2015-05-26 NOTE — Discharge Instructions (Signed)
AMBULATORY SURGERY  DISCHARGE INSTRUCTIONS   1) The drugs that you were given will stay in your system until tomorrow so for the next 24 hours you should not:  A) Drive an automobile B) Make any legal decisions C) Drink any alcoholic beverage   2) You may resume regular meals tomorrow.  Today it is better to start with liquids and gradually work up to solid foods.  You may eat anything you prefer, but it is better to start with liquids, then soup and crackers, and gradually work up to solid foods.   3) Please notify your doctor immediately if you have any unusual bleeding, trouble breathing, redness and pain at the surgery site, drainage, fever, or pain not relieved by medication. 4)   5) Your post-operative visit with Dr.                                     is: Date:                        Time:    Please call to schedule your post-operative visit.  6) Additional Instructions:     Vacuum-Assisted Closure Therapy Vacuum-assisted closure (VAC) therapy uses a device that removes fluid and germs from wounds to help them heal. It is used on wounds that cannot be closed with stitches. They often heal slowly. Vacuum-assisted therapy helps the wound stay clean and healthy while the open wound slowly grows back together. Vacuum-assisted closure therapy uses a bandage (dressing) that is made of foam. It is put inside the wound. Then, a drape is placed over the wound. This drape sticks to your skin to keep air out, and to protect the wound. A tube is hooked up to a small pump and is attached to the drape. The pump sucks out the fluid and germs. Vacuum-assisted closure therapy can also help reduce the bad smell that comes from the wound. HOW DOES IT WORK?  The vacuum pump pulls fluid through the foam dressing. The dressing may wrinkle during this process. The fluid goes into the tube and away from the wound. The fluid then goes into a container. The fluid in the container must be replaced if  it is full or at least once a week, even if the container is not full. The pulling from the pump helps to close the wound and bring better circulation to the wound area. The foam dressing covers and protects the wound. It helps your wound heal faster.  HOW DOES IT FEEL?  You might feel a little pulling when the pump is on. You might also feel a mild vibrating sensation. You might feel some discomfort when the dressing is taken off. CAN I MOVE AROUND WITH VACUUM-ASSISTED CLOSURE THERAPY? Yes, it has a backup battery which is used when the machine is not plugged in, as long as the battery is working, you can move freely. WHAT ARE SOME THINGS I MUST KNOW? Do not turn off the pump yourself, unless instructed to do so by your healthcare provider, such as for bathing. Do not take off the dressing yourself, unless instructed to do so by your caregiver. You can wash or shower with the dressing. However, do not take the pump into the shower. Make sure the wound dressing is protected and covered with plastic. The wound area must stay dry. Do not turn off the pump for more  than 2 hours. If the pump is off for more than 2 hours, your nurse must change your dressing. Check frequently that the machine is on, that the machine indicates the therapy is on, and that all clamps are open. THE ALARM IS SOUNDING! WHAT SHOULD I DO?  Stay calm. Do not turn off the pump or do anything with the dressing. Call your clinic or caregiver right away if the alarm goes off and you cannot fix the problem. Some reasons the alarm might go off include: The fluid collection container is full. The battery is low. The dressing has a leak. Explain to your caregiver what is happening. Follow the instructions you receive. WHEN SHOULD I CALL FOR HELP?  You have severe pain. You have difficulty breathing. You have bleeding that will not stop. Your wound smells bad. You have redness, swelling, or fluid leaking from your wound. Your  alarm goes off and you do not know what to do. You have a fever. Your wound itches severely. Your dressing changes are often painful or bleeding often occurs. You have diarrhea. You have a sore throat. You have a rash around the dressing or anywhere else on your body. You feel nauseous. You feel dizzy or weak. The [facility] has been off for more than 2 hours. HOW DO I GET READY TO GO HOME WITH A PUMP?  A trained caregiver will talk to you and answer your questions about your vacuum-assisted closure therapy before you go home. He or she will explain what to expect. A caregiver will come to your home to apply the pump and care for your wound. The at-home caregiver will be available for questions and will come back for the scheduled dressing changes, usually every 48-72 hours (or more often for severely infected wounds). Your at-home caregiver will also come if you are having an unexpected problem. If you have questions or do not know what to do when you go home, talk to your healthcare provider.   This information is not intended to replace advice given to you by your health care provider. Make sure you discuss any questions you have with your health care provider.   Document Released: 01/12/2008 Document Revised: 10/01/2012 Document Reviewed: 01/12/2011 Elsevier Interactive Patient Education [employer]2016 Elsevier Inc.

## 2015-05-26 NOTE — Transfer of Care (Signed)
Immediate Anesthesia Transfer of Care Note  Patient: Cynthia [name]  Procedure(s) Performed: Procedure(s): DEBRIDEMENT WOUND ( RIGHT GROIN DEBRIDEMENT ) (Right) APPLICATION OF WOUND VAC (Right)  Patient Location: PACU  Anesthesia Type:MAC  Level of Consciousness: awake, alert  and oriented  Airway & Oxygen Therapy: Patient Spontanous Breathing  Post-op Assessment: Report given to RN  Post vital signs: Reviewed and stable  Last Vitals:  Filed Vitals:   05/26/15 1157 05/26/15 1413  BP: 123/102 126/49  Pulse: 92 68  Temp: 36.2 C 36.8 C  Resp: 16 21    Complications: No apparent anesthesia complications

## 2015-05-26 NOTE — Op Note (Signed)
    OPERATIVE NOTE   PROCEDURE: 1. Irrigation and debridement of right groin wound for 10 cm 2 of skin and soft tissue  PRE-OPERATIVE DIAGNOSIS: Nonviable tissue and infection of right groin wound  POST-OPERATIVE DIAGNOSIS: Same as above  SURGEON: Festus BarrenJason Dew, MD  ASSISTANT(S): Raul DelKim Stegmayer, PA-C  ANESTHESIA: general  ESTIMATED BLOOD LOSS: minimal  FINDING(S): None  SPECIMEN(S):  none  INDICATIONS:   [name] is a 82 y.o. female who presents with fibrinous exudate and opening of the distal portion of her right groin wound.  This is to be debrided and managed with a negative pressure dressing.  Risks and benefits are discussed and she is agreeable to proceed.  DESCRIPTION: After obtaining full informed written consent, the patient was brought back to the operating room and placed supine upon the operating table.  The patient received IV antibiotics prior to induction.  After obtaining adequate anesthesia, the patient was prepped and draped in the standard fashion.  The wound was then opened and excisional debridement was performed to the distal part of the wound with scalpel and Metzenbaum scissors to remove all clearly non-viable tissue.  The tissue was taken back to bleeding tissue that appeared viable.  The debridement was performed sharply and involved the skin and soft tissue and encompassed an area of approximately 10 cm2.  The wound was irrigated copiously with sterile saline.  After all clearly non-viable tissue was removed, a negative pressure dressing was applied to the wound with a good occlusive seal obtained. The patient was then awakened from anesthesia and taken to the recovery room in stable condition having tolerated the procedure well.  COMPLICATIONS: none  CONDITION: stable  DEW,JASON  05/26/2015, 2:12 PM

## 2015-05-26 NOTE — Anesthesia Postprocedure Evaluation (Signed)
Anesthesia Post Note  Patient: Cynthia [name]  Procedure(s) Performed: Procedure(s) (LRB): DEBRIDEMENT WOUND ( RIGHT GROIN DEBRIDEMENT ) (Right) APPLICATION OF WOUND VAC (Right)  Patient location during evaluation: PACU Anesthesia Type: General Level of consciousness: awake and alert Pain management: pain level controlled Vital Signs Assessment: post-procedure vital signs reviewed and stable Respiratory status: spontaneous breathing, nonlabored ventilation, respiratory function stable and patient connected to nasal cannula oxygen Cardiovascular status: blood pressure returned to baseline and stable Postop Assessment: no signs of nausea or vomiting Anesthetic complications: no    Last Vitals:  Filed Vitals:   05/26/15 1429 05/26/15 1443  BP: 98/86 123/77  Pulse: 82   Temp:  36.2 C  Resp: 24     Last Pain: There were no vitals filed for this visit.               Cleda MccreedyJoseph K Carthel Castille

## 2015-05-26 NOTE — H&P (Signed)
  [facility] History & Physical Update  The patient was interviewed and re-examined.  The patient's previous History and Physical has been reviewed and is unchanged.  There is no change in the plan of care. We plan to proceed with the scheduled procedure.  Arelly Whittenberg, MD  05/26/2015, 12:03 PM

## 2015-05-30 DIAGNOSIS — I2510 Atherosclerotic heart disease of native coronary artery without angina pectoris: Secondary | ICD-10-CM | POA: Diagnosis not present

## 2015-05-30 DIAGNOSIS — L03314 Cellulitis of groin: Secondary | ICD-10-CM | POA: Diagnosis not present

## 2015-05-30 DIAGNOSIS — I70223 Atherosclerosis of native arteries of extremities with rest pain, bilateral legs: Secondary | ICD-10-CM | POA: Diagnosis not present

## 2015-05-30 DIAGNOSIS — I10 Essential (primary) hypertension: Secondary | ICD-10-CM | POA: Diagnosis not present

## 2015-05-30 DIAGNOSIS — J449 Chronic obstructive pulmonary disease, unspecified: Secondary | ICD-10-CM | POA: Diagnosis not present

## 2015-05-30 DIAGNOSIS — T8189XD Other complications of procedures, not elsewhere classified, subsequent encounter: Secondary | ICD-10-CM | POA: Diagnosis not present

## 2015-05-31 ENCOUNTER — Other Ambulatory Visit: Payer: Self-pay | Admitting: *Deleted

## 2015-05-31 DIAGNOSIS — J441 Chronic obstructive pulmonary disease with (acute) exacerbation: Secondary | ICD-10-CM

## 2015-05-31 NOTE — Patient Outreach (Signed)
[facility]) Care Management  05/31/2015  Cynthia [name] 29-May-1925 [id number]  Subjective: Telephone call to patient's home number, spoke with patient, and HIPAA verified.    Patient gave [facility] verbal authorization to speak with nieces Durel Salts and Britta Mccreedy) regarding her healthcare needs as needed.   s Discussed [place] Management program with patient, patient's niece Durel Salts, and both in agreement to patient receiving services.  Patient states she is doing pretty good, considering everything that she has going on.   States she is currently receiving home health services through [facility].    Patient states she has wound VAC and home oxygen.   Patient states she has a history of CAD, hypothyroidism, COPD, and hypertension.    Patient states she has very poor vision and is able to make do.   Spoke with patient's niece Durel Salts and niece would like to be called [phone]) for initial Community RNCM visit scheduling any day after 3:30pm.    Niece did not want to take RNCM's contact number and states she will wait for Community RNCM to call, has to go run errands at this time.  Patient will continue to receive [facility] Management services and no telephone RNCM needs at this time.   Objective: Per chart review:  Patient hospitalized  04/20/15 - 3/11/7 for Aortic angioplasty and Bilateral femoral endarterectomy.    Patient closed to [facility] Management program on 03/04/15 due to goals being met.   Assessment: Received [facility] referral on 05/30/15.   Referral source: Jettie Booze.    Referral reason:  Disease and symptom management.     Patient with recent hospitalization discharged on 3/11/7.   ED utilization:  1 visit within 6 months.     Services requested: [employer].     Plan:   RNCM will send refer to [facility] for disease management, disease education, care coordination due to recent hospitalization.     Jermarion Poffenberger H. Annia Friendly, BSN,  [place] Management [facility] Telephonic CM Phone: [phone] Fax: [phone]

## 2015-06-01 DIAGNOSIS — I739 Peripheral vascular disease, unspecified: Secondary | ICD-10-CM | POA: Diagnosis not present

## 2015-06-01 DIAGNOSIS — I70213 Atherosclerosis of native arteries of extremities with intermittent claudication, bilateral legs: Secondary | ICD-10-CM | POA: Diagnosis not present

## 2015-06-01 DIAGNOSIS — J449 Chronic obstructive pulmonary disease, unspecified: Secondary | ICD-10-CM | POA: Diagnosis not present

## 2015-06-01 DIAGNOSIS — I10 Essential (primary) hypertension: Secondary | ICD-10-CM | POA: Diagnosis not present

## 2015-06-01 DIAGNOSIS — I70223 Atherosclerosis of native arteries of extremities with rest pain, bilateral legs: Secondary | ICD-10-CM | POA: Diagnosis not present

## 2015-06-01 DIAGNOSIS — M1990 Unspecified osteoarthritis, unspecified site: Secondary | ICD-10-CM | POA: Diagnosis not present

## 2015-06-01 DIAGNOSIS — E785 Hyperlipidemia, unspecified: Secondary | ICD-10-CM | POA: Diagnosis not present

## 2015-06-01 DIAGNOSIS — I2510 Atherosclerotic heart disease of native coronary artery without angina pectoris: Secondary | ICD-10-CM | POA: Diagnosis not present

## 2015-06-01 DIAGNOSIS — T814XXA Infection following a procedure, initial encounter: Secondary | ICD-10-CM | POA: Diagnosis not present

## 2015-06-01 DIAGNOSIS — T8189XD Other complications of procedures, not elsewhere classified, subsequent encounter: Secondary | ICD-10-CM | POA: Diagnosis not present

## 2015-06-01 DIAGNOSIS — M79609 Pain in unspecified limb: Secondary | ICD-10-CM | POA: Diagnosis not present

## 2015-06-03 ENCOUNTER — Other Ambulatory Visit: Payer: Self-pay | Admitting: Vascular Surgery

## 2015-06-03 DIAGNOSIS — I10 Essential (primary) hypertension: Secondary | ICD-10-CM | POA: Diagnosis not present

## 2015-06-03 DIAGNOSIS — T8189XD Other complications of procedures, not elsewhere classified, subsequent encounter: Secondary | ICD-10-CM | POA: Diagnosis not present

## 2015-06-03 DIAGNOSIS — I2510 Atherosclerotic heart disease of native coronary artery without angina pectoris: Secondary | ICD-10-CM | POA: Diagnosis not present

## 2015-06-03 DIAGNOSIS — J449 Chronic obstructive pulmonary disease, unspecified: Secondary | ICD-10-CM | POA: Diagnosis not present

## 2015-06-03 DIAGNOSIS — I700 Atherosclerosis of aorta: Secondary | ICD-10-CM

## 2015-06-03 DIAGNOSIS — I70223 Atherosclerosis of native arteries of extremities with rest pain, bilateral legs: Secondary | ICD-10-CM | POA: Diagnosis not present

## 2015-06-05 DIAGNOSIS — J449 Chronic obstructive pulmonary disease, unspecified: Secondary | ICD-10-CM | POA: Diagnosis not present

## 2015-06-06 ENCOUNTER — Other Ambulatory Visit: Payer: Self-pay | Admitting: *Deleted

## 2015-06-06 DIAGNOSIS — J449 Chronic obstructive pulmonary disease, unspecified: Secondary | ICD-10-CM | POA: Diagnosis not present

## 2015-06-06 DIAGNOSIS — T8189XD Other complications of procedures, not elsewhere classified, subsequent encounter: Secondary | ICD-10-CM | POA: Diagnosis not present

## 2015-06-06 DIAGNOSIS — I10 Essential (primary) hypertension: Secondary | ICD-10-CM | POA: Diagnosis not present

## 2015-06-06 DIAGNOSIS — I70223 Atherosclerosis of native arteries of extremities with rest pain, bilateral legs: Secondary | ICD-10-CM | POA: Diagnosis not present

## 2015-06-06 DIAGNOSIS — I2510 Atherosclerotic heart disease of native coronary artery without angina pectoris: Secondary | ICD-10-CM | POA: Diagnosis not present

## 2015-06-06 NOTE — Patient Outreach (Signed)
[facility].    Attempt call was made to Loreli Dollarindy Gates (pt's niece) as was told she was to be called for initial community RN CM visit.   HIPPA compliant voice message left with name and contact number.   If no response, will try again.     [name]   Jaz Laningham RN CCM [facility]  [phone]

## 2015-06-07 ENCOUNTER — Other Ambulatory Visit: Payer: Self-pay | Admitting: *Deleted

## 2015-06-07 NOTE — Patient Outreach (Signed)
Returned call to pt's niece Jola BabinskiCindy Cates in response to voice message left by RN CM yesterday.   Spoke with niece, HIPPA verified on pt.   Niece reports she is pt's HCPOA also.    Discussed with niece [facility]- do a home visit with pt.   As discussed, plan to f/u with pt 5/2- initial home visit, niece Arline AspCindy to be present during visit.     [name]   Jabaree Mercado RN CCM [facility]  [phone]

## 2015-06-08 DIAGNOSIS — I10 Essential (primary) hypertension: Secondary | ICD-10-CM | POA: Diagnosis not present

## 2015-06-08 DIAGNOSIS — I70223 Atherosclerosis of native arteries of extremities with rest pain, bilateral legs: Secondary | ICD-10-CM | POA: Diagnosis not present

## 2015-06-08 DIAGNOSIS — T8189XD Other complications of procedures, not elsewhere classified, subsequent encounter: Secondary | ICD-10-CM | POA: Diagnosis not present

## 2015-06-08 DIAGNOSIS — I2510 Atherosclerotic heart disease of native coronary artery without angina pectoris: Secondary | ICD-10-CM | POA: Diagnosis not present

## 2015-06-08 DIAGNOSIS — J449 Chronic obstructive pulmonary disease, unspecified: Secondary | ICD-10-CM | POA: Diagnosis not present

## 2015-06-09 ENCOUNTER — Ambulatory Visit: Admission: RE | Admit: 2015-06-09 | Payer: Commercial Managed Care - HMO | Source: Ambulatory Visit

## 2015-06-13 ENCOUNTER — Ambulatory Visit
Admission: RE | Admit: 2015-06-13 | Discharge: 2015-06-13 | Disposition: A | Discharge status: Home / Self Care | Payer: Commercial Managed Care - HMO | Source: Ambulatory Visit | Attending: Vascular Surgery | Admitting: Vascular Surgery

## 2015-06-13 DIAGNOSIS — I10 Essential (primary) hypertension: Secondary | ICD-10-CM | POA: Diagnosis not present

## 2015-06-13 DIAGNOSIS — I743 Embolism and thrombosis of arteries of the lower extremities: Secondary | ICD-10-CM | POA: Diagnosis not present

## 2015-06-13 DIAGNOSIS — I2510 Atherosclerotic heart disease of native coronary artery without angina pectoris: Secondary | ICD-10-CM | POA: Diagnosis not present

## 2015-06-13 DIAGNOSIS — I70209 Unspecified atherosclerosis of native arteries of extremities, unspecified extremity: Secondary | ICD-10-CM | POA: Diagnosis not present

## 2015-06-13 DIAGNOSIS — Z9049 Acquired absence of other specified parts of digestive tract: Secondary | ICD-10-CM | POA: Diagnosis not present

## 2015-06-13 DIAGNOSIS — J449 Chronic obstructive pulmonary disease, unspecified: Secondary | ICD-10-CM | POA: Diagnosis not present

## 2015-06-13 DIAGNOSIS — I739 Peripheral vascular disease, unspecified: Secondary | ICD-10-CM | POA: Diagnosis not present

## 2015-06-13 DIAGNOSIS — J439 Emphysema, unspecified: Secondary | ICD-10-CM | POA: Diagnosis not present

## 2015-06-13 DIAGNOSIS — Z9889 Other specified postprocedural states: Secondary | ICD-10-CM | POA: Diagnosis not present

## 2015-06-13 DIAGNOSIS — Z90710 Acquired absence of both cervix and uterus: Secondary | ICD-10-CM | POA: Diagnosis not present

## 2015-06-13 DIAGNOSIS — I70223 Atherosclerosis of native arteries of extremities with rest pain, bilateral legs: Secondary | ICD-10-CM | POA: Diagnosis not present

## 2015-06-13 DIAGNOSIS — T8189XD Other complications of procedures, not elsewhere classified, subsequent encounter: Secondary | ICD-10-CM | POA: Diagnosis not present

## 2015-06-13 DIAGNOSIS — I700 Atherosclerosis of aorta: Secondary | ICD-10-CM | POA: Diagnosis not present

## 2015-06-13 MED ORDER — IOPAMIDOL (ISOVUE-370) INJECTION 76%
100.0000 mL | Freq: Once | INTRAVENOUS | Status: AC | PRN
  Administered 2015-06-13: 100 mL via INTRAVENOUS

## 2015-06-14 ENCOUNTER — Other Ambulatory Visit: Payer: Self-pay | Admitting: *Deleted

## 2015-06-14 ENCOUNTER — Encounter: Payer: Self-pay | Admitting: *Deleted

## 2015-06-14 NOTE — Patient Outreach (Addendum)
[facility] ([facility]) Care Management   06/15/2015  Cynthia [name] 01/25/1926 [id number]  Cynthia [name] is an 82 y.o. female  Subjective:  Pt reports when she had her arteries cleaned out, right side groin got infected, currently on Wound vac.  Pt reports HH RN comes three times a week to change dressing, told getting better.   Niece [name] present during home visit reports pt to continue to have wound vac on for 2 more weeks.   Pt reports to f/u with Dr. Schneir  5/25 and Lung MD 5/23.    Objective:  Filed Vitals:   06/14/15 1548  BP: 130/70  Pulse: 91  Resp: 24    ROS  Physical Exam  Alert and orientated x3.  Lungs clear.  On O 2 LNC, HRR.  BS+.  Wound vac to right groin wound.  No c/o pain. No edema.   Encounter Medications:  Reviewed with pt  Outpatient Encounter Prescriptions as of 06/14/2015  Medication Sig Note  . acetaminophen (TYLENOL) 325 MG tablet Take 1-2 tablets (325-650 mg total) by mouth every 4 (four) hours as needed for mild pain (or temp >/= 101 F). 06/14/2015: As needed   . albuterol (PROVENTIL HFA;VENTOLIN HFA) 108 (90 Base) MCG/ACT inhaler Inhale 2 puffs into the lungs every 6 (six) hours as needed for wheezing or shortness of breath.   . albuterol (PROVENTIL) (2.5 MG/3ML) 0.083% nebulizer solution Take 2.5 mg by nebulization every 6 (six) hours as needed for wheezing or shortness of breath. Pt mixes with her Atrovent nebulizer solution.   . aspirin EC 81 MG tablet Take 81 mg by mouth daily.    . carvedilol (COREG) 3.125 MG tablet Take 3.125 mg by mouth 2 (two) times daily with a meal.   . clopidogrel (PLAVIX) 75 MG tablet Take 1 tablet (75 mg total) by mouth daily.   . ferrous sulfate 325 (65 FE) MG tablet Take 325 mg by mouth daily.    . fexofenadine (ALLEGRA) 180 MG tablet Take 180 mg by mouth daily.   . Fluticasone Furoate-Vilanterol (BREO ELLIPTA) 200-25 MCG/INH AEPB Inhale 1 puff into the lungs daily.   . furosemide (LASIX) 20 MG tablet Take 20 mg by  mouth daily.   . ipratropium (ATROVENT) 0.02 % nebulizer solution Take 2.5 mLs (0.5 mg total) by nebulization every 6 (six) hours as needed for wheezing or shortness of breath. Pt mixes with her albuterol nebulizer solution.   . lansoprazole (PREVACID) 30 MG capsule Take 30 mg by mouth daily.    . levothyroxine (SYNTHROID, LEVOTHROID) 75 MCG tablet Take 75 mcg by mouth daily before breakfast.   . Magnesium Oxide 500 MG TABS Take 500 mg by mouth daily.    . Potassium Gluconate 595 MG CAPS Take 1 capsule by mouth every morning.   . predniSONE (DELTASONE) 10 MG tablet Take 1 tablet (10 mg total) by mouth daily with breakfast. RESTART THIS AFTER FINISHING OFF THE PREDNISONE TAPER IN 12 DAYS   . cephALEXin (KEFLEX) 500 MG capsule Take 1 capsule (500 mg total) by mouth 3 (three) times daily. (Patient not taking: Reported on 06/14/2015)   . cholecalciferol (VITAMIN D) 1000 UNITS tablet Take 1,000 Units by mouth daily. Reported on 06/14/2015   . diclofenac sodium (VOLTAREN) 1 % GEL Apply 2 g topically 4 (four) times daily as needed (for pain). Reported on 06/14/2015   . HYDROcodone-acetaminophen (NORCO) 5-325 MG tablet Take 1 tablet by mouth every 6 (six) hours as needed for moderate   pain.   . ketorolac (TORADOL) 10 MG tablet Take 1 tablet (10 mg total) by mouth every 6 (six) hours as needed for moderate pain. (Patient not taking: Reported on 06/14/2015)   . meloxicam (MOBIC) 15 MG tablet TAKE ONE TABLET BY MOUTH ONCE DAILY FOR ARTHRITIS (Patient not taking: Reported on 06/14/2015)   . multivitamin-lutein (OCUVITE-LUTEIN) CAPS capsule Take 1 capsule by mouth daily. Reported on 06/14/2015   . nitroGLYCERIN (NITROSTAT) 0.4 MG SL tablet Place 0.4 mg under the tongue every 5 (five) minutes as needed for chest pain. Reported on 06/14/2015 06/14/2015: Available if needed   . Omega-3 Fatty Acids (FISH OIL) 1000 MG CAPS Take 1,000 mg by mouth daily. Reported on 06/14/2015   . sulfamethoxazole-trimethoprim (BACTRIM DS,SEPTRA DS)  800-160 MG tablet Take 1 tablet by mouth 2 (two) times daily. (Patient not taking: Reported on 05/26/2015)   . vitamin C (ASCORBIC ACID) 500 MG tablet Take 500 mg by mouth daily. Reported on 06/14/2015    No facility-administered encounter medications on file as of 06/14/2015.    Functional Status:   In your present state of health, do you have any difficulty performing the following activities: 06/14/2015 05/25/2015  Hearing? N N  Vision? Y Y  Difficulty concentrating or making decisions? N N  Walking or climbing stairs? Y Y  Dressing or bathing? N Y  Doing errands, shopping? Y -  Preparing Food and eating ? Y -  Using the Toilet? N -  In the past six months, have you accidently leaked urine? N -  Do you have problems with loss of bowel control? N -  Managing your Medications? N -  Managing your Finances? Y -  Housekeeping or managing your Housekeeping? Y -    Fall/Depression Screening:    PHQ 2/9 Scores 06/14/2015 02/18/2015 02/10/2015 01/20/2015  PHQ - 2 Score 1 0 0 0    Assessment:  Pleasant 82 year old woman, resides by herself.  2 nieces very supportive, both assist as needed as  pt's eyesight poor.                             COPD- stable, took O2 off short time during home visit- sob noted, reapplied.  O2 sat at rest 97% with O2 2LNC on.   Lungs clear.                            Skin integrity-   Pt on continuous wound vac for  right groin wound.     Plan:  Pt to continue to be compliant with respiratory medications, O2-  management of COPD            Pt to f/u with Lung MD 5/23.            Pt to f/u with Dr. Schneir (vascular surgeon) 5/25.            Plan to continue to provide community nurse case management services short term,  next home visit 6/2.            Plan to inform Dr. Fisher of [facility] involvement - send barrier letter,route 5/2 home visit.    [facility] CM Care Plan Problem One        Most Recent Value   Care Plan Problem One  Skin integrity - right groin wound    Role  Documenting the Problem One  Care Management   Coordinator   Care Plan for Problem One  Active   [facility] Long Term Goal (31-90 days)  Pt would continue to see healing in right groin wound in the next 40 days    [facility] Long Term Goal Start Date  06/14/15   Interventions for Problem One Long Term Goal  Discussed with pt s/s to report to [facility] RN who is coming 3 tiimes a week for dressing changes.    [facility] CM Short Term Goal #1 (0-30 days)  Pt would continue to be compliant with exercises to help increase circulation in lower extremities.    [facility] CM Short Term Goal #1 Start Date  06/14/15   Interventions for Short Term Goal #1  Reviewed, demonstrated exercises to do to help increase circulation to lower extremiteis, thus promote healing.      [facility] CM Care Plan Problem Two        Most Recent Value   Care Plan Problem Two  Hx of COPD-   Role Documenting the Problem Two  Care Management Coordinator   Care Plan for Problem Two  Active   [facility] CM Short Term Goal #1 (0-30 days)  Pt would have no issues with her COPD in the next 30 days    [facility] CM Short Term Goal #1 Start Date  06/14/15   Interventions for Short Term Goal #2   Reinforced with pt ongoing medication compliance, pace activites, use of purse lip breathing as needed.      Zara Chess.   [facility] Care Management  [phone]

## 2015-06-15 ENCOUNTER — Encounter: Payer: Self-pay | Admitting: *Deleted

## 2015-06-15 DIAGNOSIS — I2510 Atherosclerotic heart disease of native coronary artery without angina pectoris: Secondary | ICD-10-CM | POA: Diagnosis not present

## 2015-06-15 DIAGNOSIS — J449 Chronic obstructive pulmonary disease, unspecified: Secondary | ICD-10-CM | POA: Diagnosis not present

## 2015-06-15 DIAGNOSIS — T8189XD Other complications of procedures, not elsewhere classified, subsequent encounter: Secondary | ICD-10-CM | POA: Diagnosis not present

## 2015-06-15 DIAGNOSIS — I70223 Atherosclerosis of native arteries of extremities with rest pain, bilateral legs: Secondary | ICD-10-CM | POA: Diagnosis not present

## 2015-06-15 DIAGNOSIS — I10 Essential (primary) hypertension: Secondary | ICD-10-CM | POA: Diagnosis not present

## 2015-06-17 DIAGNOSIS — I10 Essential (primary) hypertension: Secondary | ICD-10-CM | POA: Diagnosis not present

## 2015-06-17 DIAGNOSIS — I2510 Atherosclerotic heart disease of native coronary artery without angina pectoris: Secondary | ICD-10-CM | POA: Diagnosis not present

## 2015-06-17 DIAGNOSIS — I70223 Atherosclerosis of native arteries of extremities with rest pain, bilateral legs: Secondary | ICD-10-CM | POA: Diagnosis not present

## 2015-06-17 DIAGNOSIS — J449 Chronic obstructive pulmonary disease, unspecified: Secondary | ICD-10-CM | POA: Diagnosis not present

## 2015-06-17 DIAGNOSIS — T8189XD Other complications of procedures, not elsewhere classified, subsequent encounter: Secondary | ICD-10-CM | POA: Diagnosis not present

## 2015-06-20 DIAGNOSIS — T8189XD Other complications of procedures, not elsewhere classified, subsequent encounter: Secondary | ICD-10-CM | POA: Diagnosis not present

## 2015-06-20 DIAGNOSIS — I10 Essential (primary) hypertension: Secondary | ICD-10-CM | POA: Diagnosis not present

## 2015-06-20 DIAGNOSIS — I70223 Atherosclerosis of native arteries of extremities with rest pain, bilateral legs: Secondary | ICD-10-CM | POA: Diagnosis not present

## 2015-06-20 DIAGNOSIS — J449 Chronic obstructive pulmonary disease, unspecified: Secondary | ICD-10-CM | POA: Diagnosis not present

## 2015-06-20 DIAGNOSIS — I2510 Atherosclerotic heart disease of native coronary artery without angina pectoris: Secondary | ICD-10-CM | POA: Diagnosis not present

## 2015-06-21 DIAGNOSIS — J45909 Unspecified asthma, uncomplicated: Secondary | ICD-10-CM | POA: Diagnosis not present

## 2015-06-21 DIAGNOSIS — I10 Essential (primary) hypertension: Secondary | ICD-10-CM | POA: Diagnosis not present

## 2015-06-21 DIAGNOSIS — J441 Chronic obstructive pulmonary disease with (acute) exacerbation: Secondary | ICD-10-CM | POA: Diagnosis not present

## 2015-06-21 DIAGNOSIS — J9611 Chronic respiratory failure with hypoxia: Secondary | ICD-10-CM | POA: Diagnosis not present

## 2015-06-22 DIAGNOSIS — T8189XD Other complications of procedures, not elsewhere classified, subsequent encounter: Secondary | ICD-10-CM | POA: Diagnosis not present

## 2015-06-22 DIAGNOSIS — J449 Chronic obstructive pulmonary disease, unspecified: Secondary | ICD-10-CM | POA: Diagnosis not present

## 2015-06-22 DIAGNOSIS — I10 Essential (primary) hypertension: Secondary | ICD-10-CM | POA: Diagnosis not present

## 2015-06-22 DIAGNOSIS — I2510 Atherosclerotic heart disease of native coronary artery without angina pectoris: Secondary | ICD-10-CM | POA: Diagnosis not present

## 2015-06-22 DIAGNOSIS — I70223 Atherosclerosis of native arteries of extremities with rest pain, bilateral legs: Secondary | ICD-10-CM | POA: Diagnosis not present

## 2015-06-24 DIAGNOSIS — I70223 Atherosclerosis of native arteries of extremities with rest pain, bilateral legs: Secondary | ICD-10-CM | POA: Diagnosis not present

## 2015-06-24 DIAGNOSIS — T8189XD Other complications of procedures, not elsewhere classified, subsequent encounter: Secondary | ICD-10-CM | POA: Diagnosis not present

## 2015-06-24 DIAGNOSIS — I2510 Atherosclerotic heart disease of native coronary artery without angina pectoris: Secondary | ICD-10-CM | POA: Diagnosis not present

## 2015-06-24 DIAGNOSIS — J449 Chronic obstructive pulmonary disease, unspecified: Secondary | ICD-10-CM | POA: Diagnosis not present

## 2015-06-24 DIAGNOSIS — I10 Essential (primary) hypertension: Secondary | ICD-10-CM | POA: Diagnosis not present

## 2015-06-25 DIAGNOSIS — L03314 Cellulitis of groin: Secondary | ICD-10-CM | POA: Diagnosis not present

## 2015-06-27 DIAGNOSIS — I2510 Atherosclerotic heart disease of native coronary artery without angina pectoris: Secondary | ICD-10-CM | POA: Diagnosis not present

## 2015-06-27 DIAGNOSIS — J449 Chronic obstructive pulmonary disease, unspecified: Secondary | ICD-10-CM | POA: Diagnosis not present

## 2015-06-27 DIAGNOSIS — I70223 Atherosclerosis of native arteries of extremities with rest pain, bilateral legs: Secondary | ICD-10-CM | POA: Diagnosis not present

## 2015-06-27 DIAGNOSIS — I10 Essential (primary) hypertension: Secondary | ICD-10-CM | POA: Diagnosis not present

## 2015-06-27 DIAGNOSIS — T8189XD Other complications of procedures, not elsewhere classified, subsequent encounter: Secondary | ICD-10-CM | POA: Diagnosis not present

## 2015-06-29 DIAGNOSIS — I10 Essential (primary) hypertension: Secondary | ICD-10-CM | POA: Diagnosis not present

## 2015-06-29 DIAGNOSIS — J449 Chronic obstructive pulmonary disease, unspecified: Secondary | ICD-10-CM | POA: Diagnosis not present

## 2015-06-29 DIAGNOSIS — T8189XD Other complications of procedures, not elsewhere classified, subsequent encounter: Secondary | ICD-10-CM | POA: Diagnosis not present

## 2015-06-29 DIAGNOSIS — I2510 Atherosclerotic heart disease of native coronary artery without angina pectoris: Secondary | ICD-10-CM | POA: Diagnosis not present

## 2015-06-29 DIAGNOSIS — I70223 Atherosclerosis of native arteries of extremities with rest pain, bilateral legs: Secondary | ICD-10-CM | POA: Diagnosis not present

## 2015-06-30 ENCOUNTER — Other Ambulatory Visit: Payer: Self-pay | Admitting: Family Medicine

## 2015-06-30 ENCOUNTER — Ambulatory Visit (INDEPENDENT_AMBULATORY_CARE_PROVIDER_SITE_OTHER): Payer: Commercial Managed Care - HMO | Admitting: Family Medicine

## 2015-06-30 VITALS — BP 124/66 | HR 96 | Temp 98.6°F | Resp 20 | Wt 129.0 lb

## 2015-06-30 DIAGNOSIS — J441 Chronic obstructive pulmonary disease with (acute) exacerbation: Secondary | ICD-10-CM

## 2015-06-30 DIAGNOSIS — R509 Fever, unspecified: Secondary | ICD-10-CM

## 2015-06-30 LAB — POCT URINALYSIS DIPSTICK
BILIRUBIN UA: NEGATIVE
Glucose, UA: NEGATIVE
LEUKOCYTES UA: NEGATIVE
Nitrite, UA: NEGATIVE
SPEC GRAV UA: 1.015
Urobilinogen, UA: 0.2
pH, UA: 6

## 2015-06-30 LAB — POCT UA - MICROSCOPIC ONLY

## 2015-06-30 MED ORDER — AZITHROMYCIN 250 MG PO TABS: ORAL_TABLET | ORAL | Status: DC

## 2015-06-30 NOTE — Progress Notes (Signed)
Patient ID: Cynthia [name], female   DOB: 26-Oct-1925, 82 y.o.   MRN: [id number]       Patient: Cynthia [name] Female    DOB: [birth year]   82 y.o.   MRN: [id number] Visit Date: 06/30/2015  Today's Provider: Dortha Kern, PA   Chief Complaint  Patient presents with  . Fatigue  . Shortness of Breath   Subjective:    HPI  Patient has not felt good in the past 2 days at least. Symptoms are decreased appetite, not feeling thirsty, fatigue, weakness to the point she almost fell yesterday, shortness pf breath last night got worse and she was not able to sleep well, had low grade fever yesterday. She is using oxygen today at 3 liters due to not feeling well. No dysuria. Having cough with white sputum frequently. Continues to use Prednisone 10 mg qd, Albuterol and Ipratropium by nebulizer with Spiriva and Breo per Dr. Welton Flakes. Had stenting of aorta and bilateral common iliac  by Dr. Wyn Quaker and Schnier with post op cellulitis and delayed wound healing requiring a wound vac since 05-21-15. Visiting nurse checks wound 2-3 times a week. States it is healed and will see Dr. Wyn Quaker next week - hoping to stop wound vac.   Past Medical History  Diagnosis Date  . Abdominal pain, epigastric   . Aneurysm of aorta (HCC)   . Osteoarthritis   . GERD (gastroesophageal reflux disease)   . Spinal stenosis of lumbar region without neurogenic claudication   . Insomnia   . Hypercholesteremia   . Osteoporosis   . Hypertension   . Stomatitis monilial   . CAD (coronary artery disease)   . Renal failure   . UTI symptoms   . Stroke (HCC)   . Allergic drug reaction   . Breathlessness on exertion   . Asthma   . COPD (chronic obstructive pulmonary disease) (HCC)   . Seborrheic keratoses   . Olecranon bursitis   . History of partial colectomy   . Pneumonia   . Hypoxia   . Nocturia   . Vaginal atrophy   . Hypothyroidism    Past Surgical History  Procedure Laterality Date  . Multilevel spinal fusion and laminectomy   2010    Due to spinal stenosis Dr. Gerrit Heck  . Rotator cuff surgery  2007  . Vaginal hysterectomy  1960  . Wrist surgery      lump removed due to pain  . Colon surgery      Hole in colon that was repaired  . Appendectomy    . Peripheral vascular catheterization N/A 03/16/2015    Procedure: Abdominal Aortogram w/Lower Extremity;  Surgeon: Renford Dills, MD;  Location: [facility];  Service: Cardiovascular;  Laterality: N/A;  . Peripheral vascular catheterization  03/16/2015    Procedure: Lower Extremity Intervention;  Surgeon: Renford Dills, MD;  Location: [facility];  Service: Cardiovascular;;  . Peripheral vascular catheterization Bilateral 04/20/2015    Procedure: Endovascular Repair/Stent Graft;  Surgeon: Renford Dills, MD;  Location: [facility];  Service: Cardiovascular;  Laterality: Bilateral;  . Back surgery    . Eye surgery Bilateral     Cataract extraction with IOL  . Wound debridement Right 05/26/2015    Procedure: DEBRIDEMENT WOUND ( RIGHT GROIN DEBRIDEMENT );  Surgeon: Annice Needy, MD;  Location: [facility];  Service: Vascular;  Laterality: Right;  . Application of wound vac Right 05/26/2015    Procedure: APPLICATION OF WOUND VAC;  Surgeon: Annice Needy, MD;  Location: [facility];  Service: Vascular;  Laterality: Right;   Family History  Problem Relation Age of Onset  . COPD Sister   . Diabetes Sister   . Heart disease Brother   . Hypertension Brother   . Diabetes Brother     Allergies  Allergen Reactions  . Nitrofurantoin Monohyd Macro Shortness Of Breath  . Levaquin [Levofloxacin] Rash  . Adhesive [Tape] Other (See Comments)    "pull skin off"  . Codeine Itching  . Crestor [Rosuvastatin] Other (See Comments)    Reaction:  Dizziness   . Duratuss G [Guaifenesin] Nausea And Vomiting  . Oxycodone-Acetaminophen Itching  . Penicillins Other (See Comments)    Unsure   . Propoxyphene Itching  . Tramadol Itching  . Morphine Itching and  Rash   Previous Medications   ACETAMINOPHEN (TYLENOL) 325 MG TABLET    Take 1-2 tablets (325-650 mg total) by mouth every 4 (four) hours as needed for mild pain (or temp >/= 101 F).   ALBUTEROL (PROVENTIL HFA;VENTOLIN HFA) 108 (90 BASE) MCG/ACT INHALER    Inhale 2 puffs into the lungs every 6 (six) hours as needed for wheezing or shortness of breath.   ALBUTEROL (PROVENTIL) (2.5 MG/3ML) 0.083% NEBULIZER SOLUTION    Take 2.5 mg by nebulization every 6 (six) hours as needed for wheezing or shortness of breath. Pt mixes with her Atrovent nebulizer solution.   ASPIRIN EC 81 MG TABLET    Take 81 mg by mouth daily.    CARVEDILOL (COREG) 3.125 MG TABLET    Take 3.125 mg by mouth 2 (two) times daily with a meal.   CHOLECALCIFEROL (VITAMIN D) 1000 UNITS TABLET    Take 1,000 Units by mouth daily. Reported on 06/14/2015   CLOPIDOGREL (PLAVIX) 75 MG TABLET    Take 1 tablet (75 mg total) by mouth daily.   DEXTROMETHORPHAN-GUAIFENESIN (MUCINEX DM) 30-600 MG 12HR TABLET    Take by mouth.   DICLOFENAC SODIUM (VOLTAREN) 1 % GEL    Apply 2 g topically 4 (four) times daily as needed (for pain). Reported on 06/14/2015   FERROUS SULFATE 325 (65 FE) MG TABLET    Take 325 mg by mouth daily.    FEXOFENADINE (ALLEGRA) 180 MG TABLET    Take 180 mg by mouth daily.   FLUTICASONE FUROATE-VILANTEROL (BREO ELLIPTA) 200-25 MCG/INH AEPB    Inhale 1 puff into the lungs daily.   FUROSEMIDE (LASIX) 20 MG TABLET    Take 20 mg by mouth daily.   HYDROCODONE-ACETAMINOPHEN (NORCO) 5-325 MG TABLET    Take 1 tablet by mouth every 6 (six) hours as needed for moderate pain.   IPRATROPIUM (ATROVENT) 0.02 % NEBULIZER SOLUTION    Take 2.5 mLs (0.5 mg total) by nebulization every 6 (six) hours as needed for wheezing or shortness of breath. Pt mixes with her albuterol nebulizer solution.   KETOROLAC (TORADOL) 10 MG TABLET    Take 1 tablet (10 mg total) by mouth every 6 (six) hours as needed for moderate pain.   LANSOPRAZOLE (PREVACID) 30 MG CAPSULE     Take 30 mg by mouth daily.    LEVOTHYROXINE (SYNTHROID, LEVOTHROID) 75 MCG TABLET    Take 75 mcg by mouth daily before breakfast.   MELOXICAM (MOBIC) 15 MG TABLET    TAKE ONE TABLET BY MOUTH ONCE DAILY FOR ARTHRITIS   MULTIVITAMIN-LUTEIN (OCUVITE-LUTEIN) CAPS CAPSULE    Take 1 capsule by mouth daily. Reported on 06/14/2015   NITROGLYCERIN (NITROSTAT)  0.4 MG SL TABLET    Place 0.4 mg under the tongue every 5 (five) minutes as needed for chest pain. Reported on 06/14/2015   OMEGA-3 FATTY ACIDS (FISH OIL) 1000 MG CAPS    Take 1,000 mg by mouth daily. Reported on 06/14/2015   POTASSIUM GLUCONATE 595 MG CAPS    Take 1 capsule by mouth every morning.   PREDNISONE (DELTASONE) 10 MG TABLET    Take 1 tablet (10 mg total) by mouth daily with breakfast. RESTART THIS AFTER FINISHING OFF THE PREDNISONE TAPER IN 12 DAYS   VITAMIN C (ASCORBIC ACID) 500 MG TABLET    Take 500 mg by mouth daily. Reported on 06/14/2015   VITAMIN E 100 UNIT CAPSULE    Take by mouth daily.    Review of Systems  Constitutional: Positive for activity change, appetite change and fatigue.  Respiratory: Positive for cough and shortness of breath.   Cardiovascular: Positive for leg swelling.  Gastrointestinal: Negative.   Genitourinary: Positive for frequency (patient states due to takign lasix).  Musculoskeletal: Positive for myalgias and arthralgias.  Neurological: Positive for weakness.    Social History  Substance Use Topics  . Smoking status: Former Smoker -- 0.50 packs/day    Types: Cigarettes  . Smokeless tobacco: Never Used     Comment: quit 11 years ago  . Alcohol Use: No     Comment: 2 oz of wine at night   Objective:   BP 124/66 mmHg  Pulse 96  Temp(Src) 98.6 F (37 C)  Resp 20  Wt 129 lb (58.514 kg)  SpO2 96%  Physical Exam  Constitutional: She is oriented to person, place, and time. She appears well-developed.  HENT:  Head: Normocephalic.  Right Ear: External ear normal.  Left Ear: External ear normal.  Dry  mouth without lesions or pharyngitis.  Cardiovascular: Normal rate.   Pulmonary/Chest: She has wheezes.  Distant breath sounds with wheezing. No rales.  Abdominal: Soft. Bowel sounds are normal.  Musculoskeletal:  Well healed right femoral wound with wound vac in place. No drainage in vac. 1 cm scab over the left femoral area. No discharge. Pulses very weak in both feet. No edema today.  Lymphadenopathy:    She has no cervical adenopathy.  Neurological: She is alert and oriented to person, place, and time.      Assessment & Plan:     1. Other specified fever Had bilateral common femoral endarterectomies with aortic and bilateral common iliac stents 04-20-15. Subsequently developed wound infection but seems to have healed well without discharge in wound vac. No fever today. Will check urine, urine culture and CBC with diff and CMP to rule out signs of other infections. Proceed with follow up with vascular surgeon next week (probably will discontinue wound vac). - POCT UA - Microscopic Only - POCT urinalysis dipstick - Urine Culture - CBC with Differential/Platelet - Comprehensive metabolic panel  2. COPD with acute exacerbation (HCC) Return of cough and dyspnea due to COPD exacerbation. Continue oxygen as needed (pulse oximetry 96% today). Add Z-pak and continue Breo, Albuterol/Atrovent by nebulizer prn and Proventil-HFA inhaler as needed. Recheck as needed. If no better or worsening dyspnea, may need to go to ER. - CBC with Differential/Platelet - Comprehensive metabolic panel - azithromycin (ZITHROMAX) 250 MG tablet; Take 2 tablets by mouth today then one daily for 4 days.  Dispense: 6 tablet; Refill: 0       Dortha Kernennis Chrismon, PA  [facility]

## 2015-07-01 DIAGNOSIS — J449 Chronic obstructive pulmonary disease, unspecified: Secondary | ICD-10-CM | POA: Diagnosis not present

## 2015-07-01 DIAGNOSIS — I70223 Atherosclerosis of native arteries of extremities with rest pain, bilateral legs: Secondary | ICD-10-CM | POA: Diagnosis not present

## 2015-07-01 DIAGNOSIS — I2510 Atherosclerotic heart disease of native coronary artery without angina pectoris: Secondary | ICD-10-CM | POA: Diagnosis not present

## 2015-07-01 DIAGNOSIS — T8189XD Other complications of procedures, not elsewhere classified, subsequent encounter: Secondary | ICD-10-CM | POA: Diagnosis not present

## 2015-07-01 DIAGNOSIS — I10 Essential (primary) hypertension: Secondary | ICD-10-CM | POA: Diagnosis not present

## 2015-07-01 LAB — COMPREHENSIVE METABOLIC PANEL
A/G RATIO: 1.6 (ref 1.2–2.2)
ALBUMIN: 4.1 g/dL (ref 3.5–4.7)
ALK PHOS: 74 IU/L (ref 39–117)
ALT: 8 IU/L (ref 0–32)
AST: 18 IU/L (ref 0–40)
BILIRUBIN TOTAL: 0.9 mg/dL (ref 0.0–1.2)
BUN / CREAT RATIO: 16 (ref 12–28)
BUN: 16 mg/dL (ref 8–27)
CHLORIDE: 94 mmol/L — AB (ref 96–106)
CO2: 25 mmol/L (ref 18–29)
CREATININE: 1.01 mg/dL — AB (ref 0.57–1.00)
Calcium: 9.2 mg/dL (ref 8.7–10.3)
GFR calc Af Amer: 58 mL/min/{1.73_m2} — ABNORMAL LOW (ref >59)
GFR calc non Af Amer: 50 mL/min/{1.73_m2} — ABNORMAL LOW (ref >59)
GLOBULIN, TOTAL: 2.5 g/dL (ref 1.5–4.5)
Glucose: 102 mg/dL — ABNORMAL HIGH (ref 65–99)
POTASSIUM: 4 mmol/L (ref 3.5–5.2)
SODIUM: 137 mmol/L (ref 134–144)
Total Protein: 6.6 g/dL (ref 6.0–8.5)

## 2015-07-01 LAB — CBC WITH DIFFERENTIAL/PLATELET
BASOS ABS: 0 10*3/uL (ref 0.0–0.2)
Basos: 0 %
EOS (ABSOLUTE): 0 10*3/uL (ref 0.0–0.4)
Eos: 0 %
HEMATOCRIT: 37.7 % (ref 34.0–46.6)
Hemoglobin: 12.6 g/dL (ref 11.1–15.9)
Immature Grans (Abs): 0 10*3/uL (ref 0.0–0.1)
Immature Granulocytes: 0 %
LYMPHS ABS: 1.1 10*3/uL (ref 0.7–3.1)
LYMPHS: 9 %
MCH: 30.6 pg (ref 26.6–33.0)
MCHC: 33.4 g/dL (ref 31.5–35.7)
MCV: 92 fL (ref 79–97)
MONOS ABS: 0.8 10*3/uL (ref 0.1–0.9)
Monocytes: 7 %
NEUTROS ABS: 10 10*3/uL — AB (ref 1.4–7.0)
NEUTROS PCT: 84 %
Platelets: 243 10*3/uL (ref 150–379)
RBC: 4.12 x10E6/uL (ref 3.77–5.28)
RDW: 13.9 % (ref 12.3–15.4)
WBC: 11.9 10*3/uL — AB (ref 3.4–10.8)

## 2015-07-03 ENCOUNTER — Encounter: Payer: Self-pay | Admitting: Emergency Medicine

## 2015-07-03 ENCOUNTER — Inpatient Hospital Stay
Admission: EM | Admit: 2015-07-03 | Discharge: 2015-07-05 | DRG: 189 | Disposition: A | Discharge status: Home / Self Care | Payer: Commercial Managed Care - HMO | Attending: Internal Medicine | Admitting: Internal Medicine

## 2015-07-03 ENCOUNTER — Emergency Department: Payer: Commercial Managed Care - HMO

## 2015-07-03 DIAGNOSIS — I129 Hypertensive chronic kidney disease with stage 1 through stage 4 chronic kidney disease, or unspecified chronic kidney disease: Secondary | ICD-10-CM | POA: Diagnosis present

## 2015-07-03 DIAGNOSIS — Z888 Allergy status to other drugs, medicaments and biological substances status: Secondary | ICD-10-CM | POA: Diagnosis not present

## 2015-07-03 DIAGNOSIS — Z66 Do not resuscitate: Secondary | ICD-10-CM | POA: Diagnosis present

## 2015-07-03 DIAGNOSIS — N183 Chronic kidney disease, stage 3 (moderate): Secondary | ICD-10-CM | POA: Diagnosis present

## 2015-07-03 DIAGNOSIS — Z825 Family history of asthma and other chronic lower respiratory diseases: Secondary | ICD-10-CM | POA: Diagnosis not present

## 2015-07-03 DIAGNOSIS — Z79899 Other long term (current) drug therapy: Secondary | ICD-10-CM

## 2015-07-03 DIAGNOSIS — J9620 Acute and chronic respiratory failure, unspecified whether with hypoxia or hypercapnia: Secondary | ICD-10-CM | POA: Diagnosis not present

## 2015-07-03 DIAGNOSIS — Z8673 Personal history of transient ischemic attack (TIA), and cerebral infarction without residual deficits: Secondary | ICD-10-CM | POA: Diagnosis not present

## 2015-07-03 DIAGNOSIS — I2510 Atherosclerotic heart disease of native coronary artery without angina pectoris: Secondary | ICD-10-CM | POA: Diagnosis not present

## 2015-07-03 DIAGNOSIS — Z90710 Acquired absence of both cervix and uterus: Secondary | ICD-10-CM

## 2015-07-03 DIAGNOSIS — Z8249 Family history of ischemic heart disease and other diseases of the circulatory system: Secondary | ICD-10-CM

## 2015-07-03 DIAGNOSIS — J9611 Chronic respiratory failure with hypoxia: Secondary | ICD-10-CM | POA: Diagnosis not present

## 2015-07-03 DIAGNOSIS — Z9842 Cataract extraction status, left eye: Secondary | ICD-10-CM

## 2015-07-03 DIAGNOSIS — K219 Gastro-esophageal reflux disease without esophagitis: Secondary | ICD-10-CM | POA: Diagnosis present

## 2015-07-03 DIAGNOSIS — Z9889 Other specified postprocedural states: Secondary | ICD-10-CM

## 2015-07-03 DIAGNOSIS — G4700 Insomnia, unspecified: Secondary | ICD-10-CM | POA: Diagnosis present

## 2015-07-03 DIAGNOSIS — E039 Hypothyroidism, unspecified: Secondary | ICD-10-CM | POA: Diagnosis present

## 2015-07-03 DIAGNOSIS — Z981 Arthrodesis status: Secondary | ICD-10-CM | POA: Diagnosis not present

## 2015-07-03 DIAGNOSIS — I739 Peripheral vascular disease, unspecified: Secondary | ICD-10-CM | POA: Diagnosis present

## 2015-07-03 DIAGNOSIS — Z885 Allergy status to narcotic agent status: Secondary | ICD-10-CM | POA: Diagnosis not present

## 2015-07-03 DIAGNOSIS — J9601 Acute respiratory failure with hypoxia: Secondary | ICD-10-CM | POA: Diagnosis not present

## 2015-07-03 DIAGNOSIS — Z87891 Personal history of nicotine dependence: Secondary | ICD-10-CM

## 2015-07-03 DIAGNOSIS — M810 Age-related osteoporosis without current pathological fracture: Secondary | ICD-10-CM | POA: Diagnosis present

## 2015-07-03 DIAGNOSIS — J441 Chronic obstructive pulmonary disease with (acute) exacerbation: Secondary | ICD-10-CM

## 2015-07-03 DIAGNOSIS — Z9841 Cataract extraction status, right eye: Secondary | ICD-10-CM | POA: Diagnosis not present

## 2015-07-03 DIAGNOSIS — Z880 Allergy status to penicillin: Secondary | ICD-10-CM

## 2015-07-03 DIAGNOSIS — Z7982 Long term (current) use of aspirin: Secondary | ICD-10-CM | POA: Diagnosis not present

## 2015-07-03 DIAGNOSIS — J9621 Acute and chronic respiratory failure with hypoxia: Principal | ICD-10-CM | POA: Diagnosis present

## 2015-07-03 DIAGNOSIS — Z886 Allergy status to analgesic agent status: Secondary | ICD-10-CM | POA: Diagnosis not present

## 2015-07-03 DIAGNOSIS — Z833 Family history of diabetes mellitus: Secondary | ICD-10-CM

## 2015-07-03 DIAGNOSIS — Z961 Presence of intraocular lens: Secondary | ICD-10-CM | POA: Diagnosis present

## 2015-07-03 DIAGNOSIS — R0602 Shortness of breath: Secondary | ICD-10-CM | POA: Diagnosis not present

## 2015-07-03 DIAGNOSIS — J449 Chronic obstructive pulmonary disease, unspecified: Secondary | ICD-10-CM | POA: Diagnosis not present

## 2015-07-03 DIAGNOSIS — Z9049 Acquired absence of other specified parts of digestive tract: Secondary | ICD-10-CM

## 2015-07-03 LAB — CBC
HEMATOCRIT: 36.3 % (ref 35.0–47.0)
HEMOGLOBIN: 12.2 g/dL (ref 12.0–16.0)
MCH: 30.5 pg (ref 26.0–34.0)
MCHC: 33.5 g/dL (ref 32.0–36.0)
MCV: 90.9 fL (ref 80.0–100.0)
Platelets: 303 10*3/uL (ref 150–440)
RBC: 3.99 MIL/uL (ref 3.80–5.20)
RDW: 13.5 % (ref 11.5–14.5)
WBC: 12.8 10*3/uL — AB (ref 3.6–11.0)

## 2015-07-03 LAB — COMPREHENSIVE METABOLIC PANEL
ALK PHOS: 64 U/L (ref 38–126)
ALT: 14 U/L (ref 14–54)
AST: 25 U/L (ref 15–41)
Albumin: 3.7 g/dL (ref 3.5–5.0)
Anion gap: 10 (ref 5–15)
BILIRUBIN TOTAL: 0.7 mg/dL (ref 0.3–1.2)
BUN: 26 mg/dL — AB (ref 6–20)
CO2: 28 mmol/L (ref 22–32)
CREATININE: 1.22 mg/dL — AB (ref 0.44–1.00)
Calcium: 8.7 mg/dL — ABNORMAL LOW (ref 8.9–10.3)
Chloride: 97 mmol/L — ABNORMAL LOW (ref 101–111)
GFR calc Af Amer: 45 mL/min — ABNORMAL LOW (ref >60)
GFR, EST NON AFRICAN AMERICAN: 39 mL/min — AB (ref >60)
GLUCOSE: 125 mg/dL — AB (ref 65–99)
Potassium: 4 mmol/L (ref 3.5–5.1)
Sodium: 135 mmol/L (ref 135–145)
TOTAL PROTEIN: 7.3 g/dL (ref 6.5–8.1)

## 2015-07-03 LAB — URINE CULTURE

## 2015-07-03 LAB — TROPONIN I: TROPONIN I: 0.03 ng/mL (ref <0.031)

## 2015-07-03 LAB — BRAIN NATRIURETIC PEPTIDE: B Natriuretic Peptide: 153 pg/mL — ABNORMAL HIGH (ref 0.0–100.0)

## 2015-07-03 MED ORDER — SODIUM CHLORIDE 0.9 % IV SOLN: 250.0000 mL | INTRAVENOUS | Status: DC | PRN

## 2015-07-03 MED ORDER — ALBUTEROL SULFATE (2.5 MG/3ML) 0.083% IN NEBU
2.5000 mg | INHALATION_SOLUTION | Freq: Once | RESPIRATORY_TRACT | Status: AC
  Administered 2015-07-03: 2.5 mg via RESPIRATORY_TRACT

## 2015-07-03 MED ORDER — ONDANSETRON HCL 4 MG PO TABS: 4.0000 mg | ORAL_TABLET | Freq: Four times a day (QID) | ORAL | Status: DC | PRN

## 2015-07-03 MED ORDER — CARVEDILOL 6.25 MG PO TABS
3.1250 mg | ORAL_TABLET | Freq: Two times a day (BID) | ORAL | Status: DC
  Administered 2015-07-03 – 2015-07-05 (×3): 3.125 mg via ORAL
  Filled 2015-07-03 (×4): qty 1

## 2015-07-03 MED ORDER — ENOXAPARIN SODIUM 30 MG/0.3ML ~~LOC~~ SOLN
30.0000 mg | SUBCUTANEOUS | Status: DC
  Administered 2015-07-03 – 2015-07-04 (×2): 30 mg via SUBCUTANEOUS
  Filled 2015-07-03 (×2): qty 0.3

## 2015-07-03 MED ORDER — AZITHROMYCIN 500 MG PO TABS
500.0000 mg | ORAL_TABLET | Freq: Every day | ORAL | Status: AC
  Administered 2015-07-03: 500 mg via ORAL
  Filled 2015-07-03: qty 1

## 2015-07-03 MED ORDER — LEVOTHYROXINE SODIUM 75 MCG PO TABS
75.0000 ug | ORAL_TABLET | Freq: Every day | ORAL | Status: DC
  Administered 2015-07-04 – 2015-07-05 (×2): 75 ug via ORAL
  Filled 2015-07-03 (×2): qty 1

## 2015-07-03 MED ORDER — POTASSIUM CHLORIDE CRYS ER 20 MEQ PO TBCR
20.0000 meq | EXTENDED_RELEASE_TABLET | Freq: Two times a day (BID) | ORAL | Status: DC
  Administered 2015-07-03 – 2015-07-05 (×5): 20 meq via ORAL
  Filled 2015-07-03 (×5): qty 1

## 2015-07-03 MED ORDER — LORATADINE 10 MG PO TABS
10.0000 mg | ORAL_TABLET | Freq: Every day | ORAL | Status: DC
  Administered 2015-07-04 – 2015-07-05 (×2): 10 mg via ORAL
  Filled 2015-07-03 (×2): qty 1

## 2015-07-03 MED ORDER — METHYLPREDNISOLONE SODIUM SUCC 125 MG IJ SOLR
125.0000 mg | Freq: Once | INTRAMUSCULAR | Status: AC
  Administered 2015-07-03: 125 mg via INTRAVENOUS
  Filled 2015-07-03: qty 2

## 2015-07-03 MED ORDER — FUROSEMIDE 40 MG PO TABS
20.0000 mg | ORAL_TABLET | Freq: Every day | ORAL | Status: DC
  Administered 2015-07-04 – 2015-07-05 (×2): 20 mg via ORAL
  Filled 2015-07-03 (×3): qty 1

## 2015-07-03 MED ORDER — POLYETHYLENE GLYCOL 3350 17 G PO PACK: 17.0000 g | PACK | Freq: Every day | ORAL | Status: DC | PRN

## 2015-07-03 MED ORDER — PANTOPRAZOLE SODIUM 40 MG PO TBEC
40.0000 mg | DELAYED_RELEASE_TABLET | Freq: Every day | ORAL | Status: DC
  Administered 2015-07-04 – 2015-07-05 (×2): 40 mg via ORAL
  Filled 2015-07-03 (×2): qty 1

## 2015-07-03 MED ORDER — IPRATROPIUM-ALBUTEROL 0.5-2.5 (3) MG/3ML IN SOLN
3.0000 mL | RESPIRATORY_TRACT | Status: DC
  Administered 2015-07-03 – 2015-07-05 (×12): 3 mL via RESPIRATORY_TRACT
  Filled 2015-07-03 (×11): qty 3

## 2015-07-03 MED ORDER — SODIUM CHLORIDE 0.9% FLUSH
3.0000 mL | Freq: Two times a day (BID) | INTRAVENOUS | Status: DC
  Administered 2015-07-03 – 2015-07-05 (×5): 3 mL via INTRAVENOUS

## 2015-07-03 MED ORDER — METHYLPREDNISOLONE SODIUM SUCC 125 MG IJ SOLR
60.0000 mg | INTRAMUSCULAR | Status: DC
  Administered 2015-07-03 – 2015-07-04 (×2): 60 mg via INTRAVENOUS
  Filled 2015-07-03 (×2): qty 2

## 2015-07-03 MED ORDER — DICLOFENAC SODIUM 1 % TD GEL
2.0000 g | Freq: Four times a day (QID) | TRANSDERMAL | Status: DC | PRN
  Filled 2015-07-03: qty 100

## 2015-07-03 MED ORDER — ONDANSETRON HCL 4 MG/2ML IJ SOLN
4.0000 mg | Freq: Four times a day (QID) | INTRAMUSCULAR | Status: DC | PRN
Start: 2015-07-03 — End: 2015-07-05

## 2015-07-03 MED ORDER — DM-GUAIFENESIN ER 30-600 MG PO TB12
1.0000 | ORAL_TABLET | Freq: Two times a day (BID) | ORAL | Status: DC | PRN
  Filled 2015-07-03: qty 1

## 2015-07-03 MED ORDER — DEXTROMETHORPHAN POLISTIREX ER 30 MG/5ML PO SUER
30.0000 mg | Freq: Two times a day (BID) | ORAL | Status: DC | PRN
  Administered 2015-07-03 – 2015-07-05 (×3): 30 mg via ORAL
  Filled 2015-07-03 (×4): qty 5

## 2015-07-03 MED ORDER — ACETAMINOPHEN 325 MG PO TABS: 650.0000 mg | ORAL_TABLET | Freq: Four times a day (QID) | ORAL | Status: DC | PRN

## 2015-07-03 MED ORDER — SODIUM CHLORIDE 0.9% FLUSH
3.0000 mL | INTRAVENOUS | Status: DC | PRN
  Administered 2015-07-03: 3 mL via INTRAVENOUS
  Filled 2015-07-03: qty 3

## 2015-07-03 MED ORDER — MELOXICAM 7.5 MG PO TABS
15.0000 mg | ORAL_TABLET | Freq: Every day | ORAL | Status: DC
  Administered 2015-07-04 – 2015-07-05 (×2): 15 mg via ORAL
  Filled 2015-07-03 (×2): qty 2

## 2015-07-03 MED ORDER — CLOPIDOGREL BISULFATE 75 MG PO TABS
75.0000 mg | ORAL_TABLET | Freq: Every day | ORAL | Status: DC
  Administered 2015-07-04 – 2015-07-05 (×2): 75 mg via ORAL
  Filled 2015-07-03 (×2): qty 1

## 2015-07-03 MED ORDER — MAGNESIUM SULFATE 2 GM/50ML IV SOLN
2.0000 g | Freq: Once | INTRAVENOUS | Status: AC
  Administered 2015-07-03: 2 g via INTRAVENOUS
  Filled 2015-07-03: qty 50

## 2015-07-03 MED ORDER — AZITHROMYCIN 250 MG PO TABS
250.0000 mg | ORAL_TABLET | Freq: Every day | ORAL | Status: DC
  Administered 2015-07-04 – 2015-07-05 (×2): 250 mg via ORAL
  Filled 2015-07-03 (×2): qty 1

## 2015-07-03 MED ORDER — GUAIFENESIN ER 600 MG PO TB12
1200.0000 mg | ORAL_TABLET | Freq: Two times a day (BID) | ORAL | Status: DC | PRN
  Administered 2015-07-04: 1200 mg via ORAL
  Filled 2015-07-03: qty 2

## 2015-07-03 MED ORDER — DOCUSATE SODIUM 100 MG PO CAPS
100.0000 mg | ORAL_CAPSULE | Freq: Two times a day (BID) | ORAL | Status: DC
  Administered 2015-07-03 – 2015-07-05 (×5): 100 mg via ORAL
  Filled 2015-07-03 (×5): qty 1

## 2015-07-03 MED ORDER — ACETAMINOPHEN 650 MG RE SUPP: 650.0000 mg | Freq: Four times a day (QID) | RECTAL | Status: DC | PRN

## 2015-07-03 MED ORDER — ASPIRIN EC 81 MG PO TBEC
81.0000 mg | DELAYED_RELEASE_TABLET | Freq: Every day | ORAL | Status: DC
  Administered 2015-07-03 – 2015-07-05 (×3): 81 mg via ORAL
  Filled 2015-07-03 (×3): qty 1

## 2015-07-03 MED ORDER — BISACODYL 5 MG PO TBEC: 5.0000 mg | DELAYED_RELEASE_TABLET | Freq: Every day | ORAL | Status: DC | PRN

## 2015-07-03 NOTE — ED Notes (Signed)
Pt arrived to triage with COPD exacerbation. Pt was roomed in ED05 immediately and placed on nonrebreather. Pt now stating at 100 on 15L.

## 2015-07-03 NOTE — Progress Notes (Signed)
Anticoagulation monitoring(Lovenox):  82 yo  ordered Lovenox 40 mg Q24h  Filed Weights   07/03/15 1112  Weight: 129 lb (58.514 kg)   BMI  Lab Results  Component Value Date   CREATININE 1.22* 07/03/2015   CREATININE 1.01* 06/30/2015   CREATININE 1.14* 05/25/2015   Estimated Creatinine Clearance: 26 mL/min (by C-G formula based on Cr of 1.22). Hemoglobin & Hematocrit     Component Value Date/Time   HGB 12.2 07/03/2015 1056   HGB 11.7* 01/27/2014 1716   HCT 36.3 07/03/2015 1056   HCT 37.7 06/30/2015 1000   HCT 36.5 01/27/2014 1716     Per Protocol for Patient with estCrcl< 30 ml/min and BMI < 40, will transition to Lovenox 30 mg Q24h.

## 2015-07-03 NOTE — ED Notes (Signed)
Patient turned down to 6L [place]. NSR on monitor. Patient is still tachypneic but is resting more comfortably. SpO2 97%.

## 2015-07-03 NOTE — ED Provider Notes (Signed)
[facility] Emergency Department Provider Note  ____________________________________________    I have reviewed the triage vital signs and the nursing notes.   HISTORY  Chief Complaint COPD  History limited by respiratory distress  HPI Cynthia [name] is a 82 y.o. female who presents withshortness of breath. Patient is a history of COPD and wears oxygen at home. She has been apparently treated for a viral illness by her PCP  This became acutely worse today.     Past Medical History  Diagnosis Date  . Abdominal pain, epigastric   . Aneurysm of aorta (HCC)   . Osteoarthritis   . GERD (gastroesophageal reflux disease)   . Spinal stenosis of lumbar region without neurogenic claudication   . Insomnia   . Hypercholesteremia   . Osteoporosis   . Hypertension   . Stomatitis monilial   . CAD (coronary artery disease)   . Renal failure   . UTI symptoms   . Stroke (HCC)   . Allergic drug reaction   . Breathlessness on exertion   . Asthma   . COPD (chronic obstructive pulmonary disease) (HCC)   . Seborrheic keratoses   . Olecranon bursitis   . History of partial colectomy   . Pneumonia   . Hypoxia   . Nocturia   . Vaginal atrophy   . Hypothyroidism     Patient Active Problem List   Diagnosis Date Noted  . Ischemic leg 04/20/2015  . Cellulitis 02/08/2015  . COPD with acute exacerbation (HCC) 02/06/2015  . Atherosclerosis of coronary artery 11/11/2014  . Centriacinar emphysema (HCC) 11/11/2014  . CAFL (chronic airflow limitation) (HCC) 11/11/2014  . Polypharmacy 11/11/2014  . H/O hemicolectomy 11/11/2014  . Adult hypothyroidism 11/11/2014  . Hypoxia 11/11/2014  . Bursitis of elbow 11/11/2014  . Degenerative arthritis of hip 11/11/2014  . Basal cell papilloma 11/11/2014  . H/O respiratory system disease 07/29/2014  . Inflammation of sacroiliac joint (HCC) 09/29/2013  . AA (aortic aneurysm) (HCC) 07/19/2009  . Arthritis of hand, degenerative  11/12/2008  . Acid reflux 11/27/2007  . Lumbar canal stenosis 09/16/2007  . Hypercholesteremia 01/31/2007  . BP (high blood pressure) 11/20/2005  . OP (osteoporosis) 11/20/2005    Past Surgical History  Procedure Laterality Date  . Multilevel spinal fusion and laminectomy  2010    Due to spinal stenosis Dr. Gerrit Heck  . Rotator cuff surgery  2007  . Vaginal hysterectomy  1960  . Wrist surgery      lump removed due to pain  . Colon surgery      Hole in colon that was repaired  . Appendectomy    . Peripheral vascular catheterization N/A 03/16/2015    Procedure: Abdominal Aortogram w/Lower Extremity;  Surgeon: Renford Dills, MD;  Location: [facility];  Service: Cardiovascular;  Laterality: N/A;  . Peripheral vascular catheterization  03/16/2015    Procedure: Lower Extremity Intervention;  Surgeon: Renford Dills, MD;  Location: [facility];  Service: Cardiovascular;;  . Peripheral vascular catheterization Bilateral 04/20/2015    Procedure: Endovascular Repair/Stent Graft;  Surgeon: Renford Dills, MD;  Location: [facility];  Service: Cardiovascular;  Laterality: Bilateral;  . Back surgery    . Eye surgery Bilateral     Cataract extraction with IOL  . Wound debridement Right 05/26/2015    Procedure: DEBRIDEMENT WOUND ( RIGHT GROIN DEBRIDEMENT );  Surgeon: Annice Needy, MD;  Location: [facility];  Service: Vascular;  Laterality: Right;  . Application  of wound vac Right 05/26/2015    Procedure: APPLICATION OF WOUND VAC;  Surgeon: Annice Needy, MD;  Location: [facility];  Service: Vascular;  Laterality: Right;    Current Outpatient Rx  Name  Route  Sig  Dispense  Refill  . acetaminophen (TYLENOL) 325 MG tablet   Oral   Take 1-2 tablets (325-650 mg total) by mouth every 4 (four) hours as needed for mild pain (or temp >/= 101 F).         Marland Kitchen albuterol (PROVENTIL HFA;VENTOLIN HFA) 108 (90 Base) MCG/ACT inhaler   Inhalation   Inhale 2 puffs into the lungs every 6  (six) hours as needed for wheezing or shortness of breath.   3 Inhaler   4   . albuterol (PROVENTIL) (2.5 MG/3ML) 0.083% nebulizer solution   Nebulization   Take 2.5 mg by nebulization every 6 (six) hours as needed for wheezing or shortness of breath. Pt mixes with her Atrovent nebulizer solution.         Marland Kitchen aspirin EC 81 MG tablet   Oral   Take 81 mg by mouth daily.          Marland Kitchen azithromycin (ZITHROMAX) 250 MG tablet      Take 2 tablets by mouth today then one daily for 4 days.   6 tablet   0   . carvedilol (COREG) 3.125 MG tablet   Oral   Take 3.125 mg by mouth 2 (two) times daily with a meal.         . cholecalciferol (VITAMIN D) 1000 UNITS tablet   Oral   Take 1,000 Units by mouth daily. Reported on 06/14/2015         . clopidogrel (PLAVIX) 75 MG tablet   Oral   Take 1 tablet (75 mg total) by mouth daily.         Marland Kitchen dextromethorphan-guaiFENesin (MUCINEX DM) 30-600 MG 12hr tablet   Oral   Take by mouth.         . diclofenac sodium (VOLTAREN) 1 % GEL   Topical   Apply 2 g topically 4 (four) times daily as needed (for pain). Reported on 06/14/2015         . ferrous sulfate 325 (65 FE) MG tablet   Oral   Take 325 mg by mouth daily.          . fexofenadine (ALLEGRA) 180 MG tablet   Oral   Take 180 mg by mouth daily.         . Fluticasone Furoate-Vilanterol (BREO ELLIPTA) 200-25 MCG/INH AEPB   Inhalation   Inhale 1 puff into the lungs daily.         . furosemide (LASIX) 20 MG tablet   Oral   Take 20 mg by mouth daily.         Marland Kitchen HYDROcodone-acetaminophen (NORCO) 5-325 MG tablet   Oral   Take 1 tablet by mouth every 6 (six) hours as needed for moderate pain.   30 tablet   0   . ipratropium (ATROVENT) 0.02 % nebulizer solution   Nebulization   Take 2.5 mLs (0.5 mg total) by nebulization every 6 (six) hours as needed for wheezing or shortness of breath. Pt mixes with her albuterol nebulizer solution.   75 mL   12   . ketorolac (TORADOL) 10 MG  tablet   Oral   Take 1 tablet (10 mg total) by mouth every 6 (six) hours as needed for moderate pain.   20  tablet   0   . lansoprazole (PREVACID) 30 MG capsule   Oral   Take 30 mg by mouth daily.          Marland Kitchen. levothyroxine (SYNTHROID, LEVOTHROID) 75 MCG tablet   Oral   Take 75 mcg by mouth daily before breakfast.         . meloxicam (MOBIC) 15 MG tablet      TAKE ONE TABLET BY MOUTH ONCE DAILY FOR ARTHRITIS   30 tablet   0   . multivitamin-lutein (OCUVITE-LUTEIN) CAPS capsule   Oral   Take 1 capsule by mouth daily. Reported on 06/14/2015         . nitroGLYCERIN (NITROSTAT) 0.4 MG SL tablet   Sublingual   Place 0.4 mg under the tongue every 5 (five) minutes as needed for chest pain. Reported on 06/14/2015         . Omega-3 Fatty Acids (FISH OIL) 1000 MG CAPS   Oral   Take 1,000 mg by mouth daily. Reported on 06/14/2015         . Potassium Gluconate 595 MG CAPS   Oral   Take 1 capsule by mouth every morning.         . predniSONE (DELTASONE) 10 MG tablet   Oral   Take 1 tablet (10 mg total) by mouth daily with breakfast. RESTART THIS AFTER FINISHING OFF THE PREDNISONE TAPER IN 12 DAYS   30 tablet   0   . vitamin C (ASCORBIC ACID) 500 MG tablet   Oral   Take 500 mg by mouth daily. Reported on 06/14/2015         . vitamin E 100 UNIT capsule   Oral   Take by mouth daily.           Allergies Nitrofurantoin monohyd macro; Levaquin; Adhesive; Codeine; Crestor; Duratuss g; Oxycodone-acetaminophen; Penicillins; Propoxyphene; Tramadol; and Morphine  Family History  Problem Relation Age of Onset  . COPD Sister   . Diabetes Sister   . Heart disease Brother   . Hypertension Brother   . Diabetes Brother     Social History Social History  Substance Use Topics  . Smoking status: Former Smoker -- 0.50 packs/day    Types: Cigarettes  . Smokeless tobacco: Never Used     Comment: quit 11 years ago  . Alcohol Use: No     Comment: 2 oz of wine at night     Review of Systems Obtained after patient stabilized  Constitutional: Negative for fever. Eyes: Negative for redness ENT: Negative for sore throat Cardiovascular: Negative for chest pain Respiratory: Positive for shortness of breath. Gastrointestinal: Negative for abdominal pain Genitourinary: Negative for dysuria. Musculoskeletal: Negative for back pain. Skin: Negative for rash. Neurological: Negative for focal weakness Psychiatric: Significant anxiety, claustrophobia    ____________________________________________   PHYSICAL EXAM:  VITAL SIGNS: ED Triage Vitals  Enc Vitals Group     BP 07/03/15 1056 153/80 mmHg     Pulse Rate 07/03/15 1056 93     Resp 07/03/15 1056 30     Temp --      Temp src --      SpO2 07/03/15 1056 100 %     Weight 07/03/15 1112 129 lb (58.514 kg)     Height 07/03/15 1112 5' (1.524 m)     Head Cir --      Peak Flow --      Pain Score 07/03/15 1112 0     Pain Loc --  Pain Edu? --      Excl. in GC? --      Constitutional: Alert and oriented. Ill-appearing Eyes: Conjunctivae are normal. No erythema or injection ENT   Head: Normocephalic and atraumatic.   Mouth/Throat: Mucous membranes are moist. Cardiovascular: Normal rate, regular rhythm. Normal and symmetric distal pulses are present in the upper extremities.  Respiratory: Significant respiratory distress, tripoding position, poor air movement, scattered wheezes Gastrointestinal: Soft and non-tender in all quadrants. No distention. There is no CVA tenderness. Genitourinary: deferred Musculoskeletal: Nontender with normal range of motion in all extremities. No lower extremity tenderness nor edema. Neurologic:  Normal speech and language. No gross focal neurologic deficits are appreciated. Skin:  Skin is warm, dry and intact. No rash noted. Psychiatric: Unable to examine due to respiratory distress  ____________________________________________    LABS (pertinent  positives/negatives)  Labs Reviewed  CBC - Abnormal; Notable for the following:    WBC 12.8 (*)    All other components within normal limits  COMPREHENSIVE METABOLIC PANEL - Abnormal; Notable for the following:    Chloride 97 (*)    Glucose, Bld 125 (*)    BUN 26 (*)    Creatinine, Ser 1.22 (*)    Calcium 8.7 (*)    GFR calc non Af Amer 39 (*)    GFR calc Af Amer 45 (*)    All other components within normal limits  BRAIN NATRIURETIC PEPTIDE - Abnormal; Notable for the following:    B Natriuretic Peptide 153.0 (*)    All other components within normal limits  TROPONIN I    ____________________________________________   EKG  ED ECG REPORT I, Jene Every, the attending physician, personally viewed and interpreted this ECG.  Date: 07/03/2015 EKG Time: 12:12 PM Rate: 73 Rhythm: normal sinus rhythm QRS Axis: normal Intervals: normal ST/T Wave abnormalities: normal Conduction Disturbances: Right bundle branch block Narrative Interpretation: unremarkable   ____________________________________________    RADIOLOGY  Chest x-ray unremarkable  ____________________________________________   PROCEDURES  Procedure(s) performed: none  Critical Care performed: yes  CRITICAL CARE Performed by: Jene Every   Total critical care time: 30 minutes  Critical care time was exclusive of separately billable procedures and treating other patients.  Critical care was necessary to treat or prevent imminent or life-threatening deterioration.  Critical care was time spent personally by me on the following activities: development of treatment plan with patient and/or surrogate as well as nursing, discussions with consultants, evaluation of patient's response to treatment, examination of patient, obtaining history from patient or surrogate, ordering and performing treatments and interventions, ordering and review of laboratory studies, ordering and review of radiographic  studies, pulse oximetry and re-evaluation of patient's condition.    ____________________________________________   INITIAL IMPRESSION / ASSESSMENT AND PLAN / ED COURSE  Pertinent labs & imaging results that were available during my care of the patient were reviewed by me and considered in my medical decision making (see chart for details).  Patient presents in acute respiratory distress, initial pulse ox 87% on 2.5 L. Initially called for BiPAP the patient reports she would not tolerate it. She was able to tolerate a nonrebreather held to her face with nebulizers. 2 g of IV magnesium and IV solu Medrol ordered as well for likely severe COPD exacerbation.  ----------------------------------------- 11:55 AM on 07/03/2015 -----------------------------------------  Patient appears improved, still poor air movement on exam. But now she is tolerating nasal cannula. Lab tests are fairly unremarkable, chest x-ray is benign.  ____________________________________________   FINAL  CLINICAL IMPRESSION(S) / ED DIAGNOSES  Final diagnoses:  Acute respiratory failure with hypoxia (HCC)          Jene Every, MD 07/03/15 1246

## 2015-07-03 NOTE — H&P (Signed)
[facility] - [place] at [facility]   PATIENT NAME: Cynthia [name] Omlor    MR#:  [id number]  DATE OF BIRTH:  04/12/1925  DATE OF ADMISSION:  07/03/2015  PRIMARY CARE PHYSICIAN: Mila Merryonald Fisher, MD   REQUESTING/REFERRING PHYSICIAN: Dr. Cyril LoosenKinner  CHIEF COMPLAINT:   Chief Complaint  Patient presents with  . COPD    HISTORY OF PRESENT ILLNESS:  Cynthia [name] [place]  is a 82 y.o. female with a known history of CAD, COPD, chronic respiratory failure, hypertension, peripheral arterial disease, CKD stage III presents to the emergency room complaining of progressively worsening shortness of breath of 3 days. Patient has had some cough and increased sputum production for 10 days. But over the last 3 days she has worsened significantly. Initially patient had to be placed on a Ventimask. Later steroids and breathing treatments were administered in the emergency room and presently patient feels a little better. Although she is on 5 L oxygen. She was thought to be a candidate for BiPAP support but she refused it due to claustrophobia. She has yellow sputum. No fever. No recent antibiotic use. Follows with pulmonary Dr. Welton FlakesKhan. Has mild chronic orthopnea. No lower extremity edema. Chest x-ray shows no acute changes other than COPD.  PAST MEDICAL HISTORY:   Past Medical History  Diagnosis Date  . Abdominal pain, epigastric   . Aneurysm of aorta (HCC)   . Osteoarthritis   . GERD (gastroesophageal reflux disease)   . Spinal stenosis of lumbar region without neurogenic claudication   . Insomnia   . Hypercholesteremia   . Osteoporosis   . Hypertension   . Stomatitis monilial   . CAD (coronary artery disease)   . Renal failure   . UTI symptoms   . Stroke (HCC)   . Allergic drug reaction   . Breathlessness on exertion   . Asthma   . COPD (chronic obstructive pulmonary disease) (HCC)   . Seborrheic keratoses   . Olecranon bursitis   . History of partial colectomy   . Pneumonia   .  Hypoxia   . Nocturia   . Vaginal atrophy   . Hypothyroidism     PAST SURGICAL HISTORY:   Past Surgical History  Procedure Laterality Date  . Multilevel spinal fusion and laminectomy  2010    Due to spinal stenosis Dr. Gerrit Heckaliff  . Rotator cuff surgery  2007  . Vaginal hysterectomy  1960  . Wrist surgery      lump removed due to pain  . Colon surgery      Hole in colon that was repaired  . Appendectomy    . Peripheral vascular catheterization N/A 03/16/2015    Procedure: Abdominal Aortogram w/Lower Extremity;  Surgeon: Renford DillsGregory G Schnier, MD;  Location: [facility] INVASIVE CV LAB;  Service: Cardiovascular;  Laterality: N/A;  . Peripheral vascular catheterization  03/16/2015    Procedure: Lower Extremity Intervention;  Surgeon: Renford DillsGregory G Schnier, MD;  Location: [facility] INVASIVE CV LAB;  Service: Cardiovascular;;  . Peripheral vascular catheterization Bilateral 04/20/2015    Procedure: Endovascular Repair/Stent Graft;  Surgeon: Renford DillsGregory G Schnier, MD;  Location: [facility] INVASIVE CV LAB;  Service: Cardiovascular;  Laterality: Bilateral;  . Back surgery    . Eye surgery Bilateral     Cataract extraction with IOL  . Wound debridement Right 05/26/2015    Procedure: DEBRIDEMENT WOUND ( RIGHT GROIN DEBRIDEMENT );  Surgeon: Annice NeedyJason S Dew, MD;  Location: [facility] ORS;  Service: Vascular;  Laterality: Right;  . Application of wound vac Right  05/26/2015    Procedure: APPLICATION OF WOUND VAC;  Surgeon: Annice Needy, MD;  Location: [facility] ORS;  Service: Vascular;  Laterality: Right;    SOCIAL HISTORY:   Social History  Substance Use Topics  . Smoking status: Former Smoker -- 0.50 packs/day    Types: Cigarettes  . Smokeless tobacco: Never Used     Comment: quit 11 years ago  . Alcohol Use: No     Comment: 2 oz of wine at night    FAMILY HISTORY:   Family History  Problem Relation Age of Onset  . COPD Sister   . Diabetes Sister   . Heart disease Brother   . Hypertension Brother   . Diabetes Brother     DRUG  ALLERGIES:   Allergies  Allergen Reactions  . Nitrofurantoin Monohyd Macro Shortness Of Breath  . Levaquin [Levofloxacin] Rash  . Adhesive [Tape] Other (See Comments)    "pull skin off"  . Codeine Itching  . Crestor [Rosuvastatin] Other (See Comments)    Reaction:  Dizziness   . Duratuss G [Guaifenesin] Nausea And Vomiting  . Oxycodone-Acetaminophen Itching  . Penicillins Other (See Comments)    Unsure   . Propoxyphene Itching  . Tramadol Itching  . Morphine Itching and Rash    REVIEW OF SYSTEMS:   Review of Systems  Constitutional: Positive for malaise/fatigue. Negative for fever, chills and weight loss.  HENT: Negative for hearing loss and nosebleeds.   Eyes: Negative for blurred vision, double vision and pain.  Respiratory: Positive for cough, sputum production, shortness of breath and wheezing. Negative for hemoptysis.   Cardiovascular: Negative for chest pain, palpitations, orthopnea and leg swelling.  Gastrointestinal: Negative for nausea, vomiting, abdominal pain, diarrhea and constipation.  Genitourinary: Negative for dysuria and hematuria.  Musculoskeletal: Positive for back pain. Negative for myalgias and falls.  Skin: Negative for rash.  Neurological: Positive for weakness. Negative for dizziness, tremors, sensory change, speech change, focal weakness, seizures and headaches.  Endo/Heme/Allergies: Does not bruise/bleed easily.  Psychiatric/Behavioral: Negative for depression and memory loss. The patient is not nervous/anxious.     MEDICATIONS AT HOME:   Prior to Admission medications   Medication Sig Start Date End Date Taking? Authorizing Provider  acetaminophen (TYLENOL) 325 MG tablet Take 1-2 tablets (325-650 mg total) by mouth every 4 (four) hours as needed for mild pain (or temp >/= 101 F). 04/23/15  Yes Kimberly A Stegmayer, PA-C  albuterol (PROVENTIL HFA;VENTOLIN HFA) 108 (90 Base) MCG/ACT inhaler Inhale 2 puffs into the lungs every 6 (six) hours as needed  for wheezing or shortness of breath. 02/15/15  Yes Dennis E Chrismon, PA  albuterol (PROVENTIL) (2.5 MG/3ML) 0.083% nebulizer solution Take 2.5 mg by nebulization every 6 (six) hours as needed for wheezing or shortness of breath. Pt mixes with her Atrovent nebulizer solution.   Yes Historical Provider, MD  aspirin EC 81 MG tablet Take 81 mg by mouth daily.    Yes Historical Provider, MD  carvedilol (COREG) 3.125 MG tablet Take 3.125 mg by mouth 2 (two) times daily with a meal.   Yes Historical Provider, MD  cholecalciferol (VITAMIN D) 1000 UNITS tablet Take 1,000 Units by mouth daily. Reported on 06/14/2015   Yes Historical Provider, MD  clopidogrel (PLAVIX) 75 MG tablet Take 1 tablet (75 mg total) by mouth daily. 04/23/15  Yes Kimberly A Stegmayer, PA-C  dextromethorphan-guaiFENesin (MUCINEX DM) 30-600 MG 12hr tablet Take 1 tablet by mouth 2 (two) times daily as needed.    Yes  Historical Provider, MD  diclofenac sodium (VOLTAREN) 1 % GEL Apply 2 g topically 4 (four) times daily as needed (for pain). Reported on 06/14/2015   Yes Historical Provider, MD  ferrous sulfate 325 (65 FE) MG tablet Take 325 mg by mouth daily.    Yes Historical Provider, MD  fexofenadine (ALLEGRA) 180 MG tablet Take 180 mg by mouth daily.   Yes Historical Provider, MD  furosemide (LASIX) 20 MG tablet Take 20 mg by mouth daily.   Yes Historical Provider, MD  ipratropium (ATROVENT) 0.02 % nebulizer solution Take 2.5 mLs (0.5 mg total) by nebulization every 6 (six) hours as needed for wheezing or shortness of breath. Pt mixes with her albuterol nebulizer solution. 04/23/15  Yes Kimberly A Stegmayer, PA-C  lansoprazole (PREVACID) 30 MG capsule Take 30 mg by mouth daily.    Yes Historical Provider, MD  levothyroxine (SYNTHROID, LEVOTHROID) 75 MCG tablet Take 75 mcg by mouth daily before breakfast.   Yes Historical Provider, MD  meloxicam (MOBIC) 15 MG tablet TAKE ONE TABLET BY MOUTH ONCE DAILY FOR ARTHRITIS 04/01/15  Yes Dennis E Chrismon,  PA  nitroGLYCERIN (NITROSTAT) 0.4 MG SL tablet Place 0.4 mg under the tongue every 5 (five) minutes as needed for chest pain. Reported on 06/14/2015   Yes Historical Provider, MD  Omega-3 Fatty Acids (FISH OIL) 1000 MG CAPS Take 1,000 mg by mouth daily. Reported on 06/14/2015   Yes Historical Provider, MD  potassium chloride SA (K-DUR,KLOR-CON) 20 MEQ tablet Take 20 mEq by mouth 2 (two) times daily.   Yes Historical Provider, MD  Potassium Gluconate 595 MG CAPS Take 1 capsule by mouth every morning.   Yes Historical Provider, MD  predniSONE (DELTASONE) 10 MG tablet Take 1 tablet (10 mg total) by mouth daily with breakfast. RESTART THIS AFTER FINISHING OFF THE PREDNISONE TAPER IN 12 DAYS 02/08/15  Yes Enid Baas, MD  vitamin C (ASCORBIC ACID) 500 MG tablet Take 500 mg by mouth daily. Reported on 06/14/2015   Yes Historical Provider, MD  azithromycin (ZITHROMAX) 250 MG tablet Take 2 tablets by mouth today then one daily for 4 days. Patient not taking: Reported on 07/03/2015 06/30/15   Jodell Cipro Chrismon, PA  HYDROcodone-acetaminophen (NORCO) 5-325 MG tablet Take 1 tablet by mouth every 6 (six) hours as needed for moderate pain. Patient not taking: Reported on 07/03/2015 05/26/15   Annice Needy, MD  ketorolac (TORADOL) 10 MG tablet Take 1 tablet (10 mg total) by mouth every 6 (six) hours as needed for moderate pain. Patient not taking: Reported on 07/03/2015 04/23/15   Ranae Plumber Stegmayer, PA-C     VITAL SIGNS:  Blood pressure 113/59, pulse 77, resp. rate 24, height 5' (1.524 m), weight 58.514 kg (129 lb), SpO2 95 %.  PHYSICAL EXAMINATION:  Physical Exam  GENERAL:  82 y.o.-year-old patient lying in the bed with Conversational dyspnea EYES: Pupils equal, round, reactive to light and accommodation. No scleral icterus. Extraocular muscles intact.  HEENT: Head atraumatic, normocephalic. Oropharynx and nasopharynx clear. No oropharyngeal erythema, moist oral mucosa  NECK:  Supple, no jugular venous  distention. No thyroid enlargement, no tenderness.  LUNGS: Increased work of breathing. Poor air entry bilaterally. Wheezing. CARDIOVASCULAR: S1, S2 normal. No murmurs, rubs, or gallops.  ABDOMEN: Soft, nontender, nondistended. Bowel sounds present. No organomegaly or mass.  EXTREMITIES: No pedal edema, cyanosis, or clubbing. + 2 pedal & radial pulses b/l.   NEUROLOGIC: Cranial nerves II through XII are intact. No focal Motor or sensory deficits appreciated  b/l PSYCHIATRIC: The patient is alert and oriented x 3. Good affect.  SKIN: No obvious rash, lesion, or ulcer.   LABORATORY PANEL:   CBC  Recent Labs Lab 07/03/15 1056  WBC 12.8*  HGB 12.2  HCT 36.3  PLT 303   ------------------------------------------------------------------------------------------------------------------  Chemistries   Recent Labs Lab 07/03/15 1056  NA 135  K 4.0  CL 97*  CO2 28  GLUCOSE 125*  BUN 26*  CREATININE 1.22*  CALCIUM 8.7*  AST 25  ALT 14  ALKPHOS 64  BILITOT 0.7   ------------------------------------------------------------------------------------------------------------------  Cardiac Enzymes  Recent Labs Lab 07/03/15 1056  TROPONINI 0.03   ------------------------------------------------------------------------------------------------------------------  RADIOLOGY:  Dg Chest Portable 1 View  07/03/2015  CLINICAL DATA:  Shortness of breath, COPD exacerbation EXAM: PORTABLE CHEST 1 VIEW COMPARISON:  04/11/2015 FINDINGS: Mild patchy left basilar opacity, favored to reflect chronic scarring. No focal consolidation. Underlying chronic fissure markings/emphysematous changes. No pleural effusion or pneumothorax. The heart is normal in size. IMPRESSION: No evidence of acute cardiopulmonary disease. Electronically Signed   By: Charline Bills M.D.   On: 07/03/2015 11:51     IMPRESSION AND PLAN:   * Acute COPD exacerbation with acute on chronic respiratory failure -IV steroids,  Antibiotics - Scheduled Nebulizers - Inhalers -Wean O2 as tolerated - Consult pulmonary if no improvement  * Hypertension Continue home medications  * CAD Stable  * CKD stage III is stable  * Mild elevation of BNP No signs of fluid overload at this time. No CHF.  All the records are reviewed and case discussed with ED provider. Management plans discussed with the patient, family and they are in agreement.  CODE STATUS: DNR  TOTAL TIME TAKING CARE OF THIS PATIENT: 45 minutes.   Milagros Loll R M.D on 07/03/2015 at 12:50 PM  Between 7am to 6pm - Pager - [phone]  After 6pm go to [email or web address] - password [email or web address]  [facility]  Office  [phone]  CC: Primary care physician; Mila Merry, MD  Note: This dictation was prepared with Dragon dictation along with smaller phrase technology. Any transcriptional errors that result from this process are unintentional.

## 2015-07-04 ENCOUNTER — Telehealth: Payer: Self-pay | Admitting: Family Medicine

## 2015-07-04 LAB — CBC
HCT: 33.9 % — ABNORMAL LOW (ref 35.0–47.0)
HEMOGLOBIN: 11.5 g/dL — AB (ref 12.0–16.0)
MCH: 31.4 pg (ref 26.0–34.0)
MCHC: 33.9 g/dL (ref 32.0–36.0)
MCV: 92.8 fL (ref 80.0–100.0)
Platelets: 242 10*3/uL (ref 150–440)
RBC: 3.65 MIL/uL — AB (ref 3.80–5.20)
RDW: 13.1 % (ref 11.5–14.5)
WBC: 7.2 10*3/uL (ref 3.6–11.0)

## 2015-07-04 LAB — BASIC METABOLIC PANEL
ANION GAP: 9 (ref 5–15)
BUN: 32 mg/dL — ABNORMAL HIGH (ref 6–20)
CALCIUM: 8.5 mg/dL — AB (ref 8.9–10.3)
CO2: 27 mmol/L (ref 22–32)
CREATININE: 1.24 mg/dL — AB (ref 0.44–1.00)
Chloride: 98 mmol/L — ABNORMAL LOW (ref 101–111)
GFR calc non Af Amer: 38 mL/min — ABNORMAL LOW (ref >60)
GFR, EST AFRICAN AMERICAN: 44 mL/min — AB (ref >60)
GLUCOSE: 146 mg/dL — AB (ref 65–99)
Potassium: 4.6 mmol/L (ref 3.5–5.1)
Sodium: 134 mmol/L — ABNORMAL LOW (ref 135–145)

## 2015-07-04 LAB — EXPECTORATED SPUTUM ASSESSMENT W REFEX TO RESP CULTURE: SPECIAL REQUESTS: NORMAL

## 2015-07-04 LAB — EXPECTORATED SPUTUM ASSESSMENT W GRAM STAIN, RFLX TO RESP C

## 2015-07-04 NOTE — Telephone Encounter (Signed)
Nyra MarketFYI  [name] was advised of urine culture, Arline AspCindy states patient ended up going to the hospital and should be getting out today she states and then they will follow up-aa

## 2015-07-04 NOTE — Telephone Encounter (Signed)
Acknowledged.

## 2015-07-04 NOTE — Telephone Encounter (Signed)
Jola BabinskiCindy Cates stated she was returning Ana's call. Arline AspCindy stated that pt is in the hospital and hopes that pt will be discharged tomorrow. Thanks TNP

## 2015-07-04 NOTE — Progress Notes (Signed)
[facility] Physicians - [place] at [facility]   PATIENT NAME: Cynthia [name]    MR#:  [id number]  DATE OF BIRTH:  [birth year]  SUBJECTIVE:  CHIEF COMPLAINT:   Chief Complaint  Patient presents with  . COPD   Still short of breath. Wheezing. Yellow sputum.  REVIEW OF SYSTEMS:    Review of Systems  Constitutional: Negative for fever and chills.  HENT: Negative for sore throat.   Eyes: Negative for blurred vision, double vision and pain.  Respiratory: Positive for cough, shortness of breath and wheezing. Negative for hemoptysis.   Cardiovascular: Negative for chest pain, palpitations, orthopnea and leg swelling.  Gastrointestinal: Negative for heartburn, nausea, vomiting, abdominal pain, diarrhea and constipation.  Genitourinary: Negative for dysuria and hematuria.  Musculoskeletal: Negative for back pain and joint pain.  Skin: Negative for rash.  Neurological: Negative for sensory change, speech change, focal weakness and headaches.  Endo/Heme/Allergies: Does not bruise/bleed easily.  Psychiatric/Behavioral: Negative for depression. The patient is nervous/anxious.     DRUG ALLERGIES:   Allergies  Allergen Reactions  . Nitrofurantoin Monohyd Macro Shortness Of Breath  . Levaquin [Levofloxacin] Rash  . Adhesive [Tape] Other (See Comments)    "pull skin off"  . Codeine Itching  . Crestor [Rosuvastatin] Other (See Comments)    Reaction:  Dizziness   . Duratuss G [Guaifenesin] Nausea And Vomiting  . Oxycodone-Acetaminophen Itching  . Penicillins Other (See Comments)    Unsure   . Propoxyphene Itching  . Tramadol Itching  . Morphine Itching and Rash    VITALS:  Blood pressure 130/49, pulse 80, temperature 97.7 F (36.5 C), temperature source Oral, resp. rate 26, height 5' (1.524 m), weight 58.514 kg (129 lb), SpO2 93 %.  PHYSICAL EXAMINATION:   Physical Exam  GENERAL:  82 y.o.-year-old patient lying in the bed with Conversational dyspnea EYES: Pupils  equal, round, reactive to light and accommodation. No scleral icterus. Extraocular muscles intact.  HEENT: Head atraumatic, normocephalic. Oropharynx and nasopharynx clear.  NECK:  Supple, no jugular venous distention. No thyroid enlargement, no tenderness.  LUNGS: Increased work of breathing with bilateral wheezing and poor air entry. CARDIOVASCULAR: S1, S2 normal. No murmurs, rubs, or gallops.  ABDOMEN: Soft, nontender, nondistended. Bowel sounds present. No organomegaly or mass.  EXTREMITIES: No cyanosis, clubbing or edema b/l.    NEUROLOGIC: Cranial nerves II through XII are intact. No focal Motor or sensory deficits b/l.   PSYCHIATRIC: The patient is alert and oriented x 3.  SKIN: No obvious rash, lesion, or ulcer.   LABORATORY PANEL:   CBC  Recent Labs Lab 07/04/15 0459  WBC 7.2  HGB 11.5*  HCT 33.9*  PLT 242   ------------------------------------------------------------------------------------------------------------------ Chemistries   Recent Labs Lab 07/03/15 1056 07/04/15 0459  NA 135 134*  K 4.0 4.6  CL 97* 98*  CO2 28 27  GLUCOSE 125* 146*  BUN 26* 32*  CREATININE 1.22* 1.24*  CALCIUM 8.7* 8.5*  AST 25  --   ALT 14  --   ALKPHOS 64  --   BILITOT 0.7  --    ------------------------------------------------------------------------------------------------------------------  Cardiac Enzymes  Recent Labs Lab 07/03/15 1056  TROPONINI 0.03   ------------------------------------------------------------------------------------------------------------------  RADIOLOGY:  Dg Chest Portable 1 View  07/03/2015  CLINICAL DATA:  Shortness of breath, COPD exacerbation EXAM: PORTABLE CHEST 1 VIEW COMPARISON:  04/11/2015 FINDINGS: Mild patchy left basilar opacity, favored to reflect chronic scarring. No focal consolidation. Underlying chronic fissure markings/emphysematous changes. No pleural effusion or pneumothorax. The heart is  normal in size. IMPRESSION: No  evidence of acute cardiopulmonary disease. Electronically Signed   By: Charline BillsSriyesh  Krishnan M.D.   On: 07/03/2015 11:51     ASSESSMENT AND PLAN:   * Acute COPD exacerbation with acute on chronic respiratory failure -IV steroids, Antibiotics - Scheduled Nebulizers - Inhalers - Consult pulmonary if no improvement Patient feels mildly improved today. Continues to be on 4 L oxygen. Still has significant wheezing on examination. Will need inpatient treatment.  * Hypertension Continue home medications  * CAD Stable  * CKD stage III is stable  * Mild elevation of BNP No signs of fluid overload at this time. No CHF.  All the records are reviewed and case discussed with Care Management/Social Workerr. Management plans discussed with the patient, family and they are in agreement.  CODE STATUS: DNR  DVT Prophylaxis: SCDs  TOTAL TIME TAKING CARE OF THIS PATIENT: 30 minutes.   POSSIBLE D/C IN 1-2 DAYS, DEPENDING ON CLINICAL CONDITION.  Milagros LollSudini, Eniola Cerullo R M.D on 07/04/2015 at 1:36 PM  Between 7am to 6pm - Pager - [phone]  After 6pm go to [email or web address] - password [email or web address]  [facility] [place]  Office  [phone]  CC: Primary care physician; Mila Merryonald Fisher, MD  Note: This dictation was prepared with Dragon dictation along with smaller phrase technology. Any transcriptional errors that result from this process are unintentional.

## 2015-07-05 DIAGNOSIS — J9611 Chronic respiratory failure with hypoxia: Secondary | ICD-10-CM | POA: Diagnosis not present

## 2015-07-05 DIAGNOSIS — J441 Chronic obstructive pulmonary disease with (acute) exacerbation: Secondary | ICD-10-CM | POA: Diagnosis not present

## 2015-07-05 MED ORDER — METHYLPREDNISOLONE SODIUM SUCC 125 MG IJ SOLR: 125.0000 mg | INTRAMUSCULAR | Status: DC

## 2015-07-05 MED ORDER — ALBUTEROL SULFATE (2.5 MG/3ML) 0.083% IN NEBU: 2.5000 mg | INHALATION_SOLUTION | Freq: Once | RESPIRATORY_TRACT | Status: DC

## 2015-07-05 MED ORDER — PREDNISONE 50 MG PO TABS: 50.0000 mg | ORAL_TABLET | Freq: Every day | ORAL | Status: DC

## 2015-07-05 MED ORDER — FUROSEMIDE 10 MG/ML IJ SOLN
20.0000 mg | Freq: Once | INTRAMUSCULAR | Status: AC
  Administered 2015-07-05: 20 mg via INTRAVENOUS
  Filled 2015-07-05: qty 2

## 2015-07-05 MED ORDER — METHYLPREDNISOLONE SODIUM SUCC 125 MG IJ SOLR
60.0000 mg | Freq: Once | INTRAMUSCULAR | Status: AC
  Administered 2015-07-05: 60 mg via INTRAVENOUS
  Filled 2015-07-05: qty 2

## 2015-07-05 MED ORDER — MORPHINE SULFATE (PF) 2 MG/ML IV SOLN
1.0000 mg | INTRAVENOUS | Status: DC | PRN
  Administered 2015-07-05: 1 mg via INTRAVENOUS
  Filled 2015-07-05: qty 1

## 2015-07-05 MED ORDER — AZITHROMYCIN 250 MG PO TABS: 250.0000 mg | ORAL_TABLET | Freq: Every day | ORAL | Status: DC

## 2015-07-05 NOTE — Progress Notes (Signed)
Patient wants to wait for her POA to come up to the floor to sign discharge paperwork. Patient already has oxygen at home. No acute distress noted.

## 2015-07-05 NOTE — Care Management Important Message (Signed)
Important Message  Patient Details  Name: Cynthia [name] MRN: [id number] Date of Birth: 1925-08-16   Medicare Important Message Given:  Yes    Olegario MessierKathy A Tristram Milian 07/05/2015, 11:29 AM

## 2015-07-05 NOTE — Care Management (Signed)
Found that [facility].  Obtained order from attending to resume these services and updated [facility].  Patient's niece to transport patient home.

## 2015-07-05 NOTE — Progress Notes (Signed)
Patient discharged to home as ordered. Patient on 2.5 liters of oxygen. Daughter at the bedside and given discharge orders.. Patient on her way to see Dr. Welton FlakesKhan now in his clinic. Patient taken out via wheelchair, no acute distress noted.

## 2015-07-05 NOTE — Progress Notes (Signed)
Called Dr. Sheryle Hailiamond regarding prn medications to help patient breath better.  Doctor put in appropriate orders.  Arturo MortonClay, Phillp Dolores N  07/05/2015  2:50 AM

## 2015-07-05 NOTE — Discharge Instructions (Signed)
°  DIET:  Cardiac diet  DISCHARGE CONDITION:  Stable  ACTIVITY:  Activity as tolerated  OXYGEN:  Home Oxygen: Yes.     Oxygen Delivery: 2 liters/min via Patient connected to nasal cannula oxygen  DISCHARGE LOCATION:  home   If you experience worsening of your admission symptoms, develop shortness of breath, life threatening emergency, suicidal or homicidal thoughts you must seek medical attention immediately by calling 911 or calling your MD immediately  if symptoms less severe.  You Must read complete instructions/literature along with all the possible adverse reactions/side effects for all the Medicines you take and that have been prescribed to you. Take any new Medicines after you have completely understood and accpet all the possible adverse reactions/side effects.   Please note  You were cared for by a hospitalist during your hospital stay. If you have any questions about your discharge medications or the care you received while you were in the hospital after you are discharged, you can call the unit and asked to speak with the hospitalist on call if the hospitalist that took care of you is not available. Once you are discharged, your primary care physician will handle any further medical issues. Please note that NO REFILLS for any discharge medications will be authorized once you are discharged, as it is imperative that you return to your primary care physician (or establish a relationship with a primary care physician if you do not have one) for your aftercare needs so that they can reassess your need for medications and monitor your lab values.   Restart Home dose  prednisone after finishing 50 mg tablets prescribed.  Please return to the emergency room if there is any worsening breathing.

## 2015-07-05 NOTE — Consult Note (Signed)
   [facility] Inpatient Consult   07/05/2015  Cynthia [name] 1925-06-28 [id number]   Patient is currently active with [facility] for chronic disease management services.  Patient has been engaged by a [employer].  Our community based plan of care has focused on disease management and community resource support.  Patient will receive a post discharge transition of care call and will be evaluated for monthly home visits for assessments and disease process education. Met with patient at bedside, talked with patient about being in the hospital and let her know she would be receiving post hospital follow up calls. Patient plans to d/c to home today. Made Inpatient Case Manager aware that [facility] following. Of note, [facility] Care Management services does not replace or interfere with any services that are needed or arranged by inpatient case management or social work.  For additional questions or referrals please contact:  Kitty Cadavid RN, [place] [facility] Liaison  [phone]) [place] [phone]) Toll free office

## 2015-07-05 NOTE — Care Management (Signed)
Patient presents from home.  She is on chronic home 02 but does not know the name of the agency that provides.  She is receiving home health nurse three times a week for wound car but does not know the name of the agency that provides service. Have reached out to area home health agencies and left voice mail message for patient's niece Arline AspCindy.

## 2015-07-06 ENCOUNTER — Other Ambulatory Visit: Payer: Self-pay | Admitting: *Deleted

## 2015-07-06 DIAGNOSIS — J9611 Chronic respiratory failure with hypoxia: Secondary | ICD-10-CM | POA: Diagnosis not present

## 2015-07-06 DIAGNOSIS — I70223 Atherosclerosis of native arteries of extremities with rest pain, bilateral legs: Secondary | ICD-10-CM | POA: Diagnosis not present

## 2015-07-06 DIAGNOSIS — I2510 Atherosclerotic heart disease of native coronary artery without angina pectoris: Secondary | ICD-10-CM | POA: Diagnosis not present

## 2015-07-06 DIAGNOSIS — J449 Chronic obstructive pulmonary disease, unspecified: Secondary | ICD-10-CM | POA: Diagnosis not present

## 2015-07-06 DIAGNOSIS — T8189XD Other complications of procedures, not elsewhere classified, subsequent encounter: Secondary | ICD-10-CM | POA: Diagnosis not present

## 2015-07-06 DIAGNOSIS — J441 Chronic obstructive pulmonary disease with (acute) exacerbation: Secondary | ICD-10-CM | POA: Diagnosis not present

## 2015-07-06 DIAGNOSIS — I10 Essential (primary) hypertension: Secondary | ICD-10-CM | POA: Diagnosis not present

## 2015-07-06 NOTE — Patient Outreach (Signed)
F/u phone call - check on status, complete transition of care. Pt reports did her nebulizer treatment, not much better, getting some mucous out.  Pt reports has been taking Muccinex, not helping, to f/u with Dr. Welton FlakesKhan today.    Pt reports she was up all night, sister in law stayed with her.  Pt reports niece is coming this afternoon.  Pt reports she is sitting up now, helping some.  Pt reports appetite poor, no fever or chest pain.     Plan to f/u with pt again 6/2- home visit.    [name]   Caysen Whang RN CCM [facility] Care [facility]  [phone]

## 2015-07-06 NOTE — Patient Outreach (Signed)
Transition of care call (week 1, discharged 5/23).    Pt reports not doing so good, it's my breathing, did a nebulizer treatment, not helping, has her O2  on 3.5 liters [place].    Pt reports f/u with Dr. Welton FlakesKhan yesterday 5/23, told me should not have come out to the hospital.  Pt reports she gets claustrophobic staying in the hospital.   Pt reports she is to see Dr. Welton FlakesKhan again today at 4 pm.  Pt reports she is taking her antibiotic and Prednisone ordered post discharge, niece does her medications (per discharge summary).   RN CM discussed with pt f/u at ED if needed to which pt reports does not want to go back to the hospital, what can they do that I am not doing. Pt inquired what can she do to bring up these secretions to which RN CM discussed continue with nebulizer treatments.   Pt reports sister in law is with her now, gave me my medications.   Pt reports wound vac came off (right groin site), wound getting better.  Discussed with pt transition of care program, plan to f/u 31 days post discharge (weekly phone calls, home visit - previously scheduled for 6/2).     Addendum- 9:36 am, f/u phone call to pt.  Person answering states pt is doing a breathing treatment now.  RN CM to f/u later today.    [name]   Pierzchala RN CCM [facility] Care Management  [phone]

## 2015-07-07 DIAGNOSIS — J449 Chronic obstructive pulmonary disease, unspecified: Secondary | ICD-10-CM | POA: Diagnosis not present

## 2015-07-07 DIAGNOSIS — J9611 Chronic respiratory failure with hypoxia: Secondary | ICD-10-CM | POA: Diagnosis not present

## 2015-07-07 LAB — CULTURE, RESPIRATORY W GRAM STAIN: Special Requests: NORMAL

## 2015-07-07 LAB — CULTURE, RESPIRATORY: CULTURE: NORMAL

## 2015-07-07 NOTE — Discharge Summary (Signed)
[facility] [place] at [facility]   PATIENT NAME: Cynthia [name]    MR#:  [id number]  DATE OF BIRTH:  02-17-1925  DATE OF ADMISSION:  07/03/2015 ADMITTING PHYSICIAN: Milagros Loll, MD  DATE OF DISCHARGE: 07/05/2015  3:39 PM  PRIMARY CARE PHYSICIAN: Mila Merry, MD   ADMISSION DIAGNOSIS:  Acute respiratory failure with hypoxia (HCC) [J96.01]  DISCHARGE DIAGNOSIS:  Active Problems:   COPD exacerbation (HCC)   SECONDARY DIAGNOSIS:   Past Medical History  Diagnosis Date  . Abdominal pain, epigastric   . Aneurysm of aorta (HCC)   . Osteoarthritis   . GERD (gastroesophageal reflux disease)   . Spinal stenosis of lumbar region without neurogenic claudication   . Insomnia   . Hypercholesteremia   . Osteoporosis   . Hypertension   . Stomatitis monilial   . CAD (coronary artery disease)   . Renal failure   . UTI symptoms   . Stroke (HCC)   . Allergic drug reaction   . Breathlessness on exertion   . Asthma   . COPD (chronic obstructive pulmonary disease) (HCC)   . Seborrheic keratoses   . Olecranon bursitis   . History of partial colectomy   . Pneumonia   . Hypoxia   . Nocturia   . Vaginal atrophy   . Hypothyroidism      ADMITTING HISTORY  Cynthia [name] is a 82 y.o. female with a known history of CAD, COPD, chronic respiratory failure, hypertension, peripheral arterial disease, CKD stage III presents to the emergency room complaining of progressively worsening shortness of breath of 3 days. Patient has had some cough and increased sputum production for 10 days. But over the last 3 days she has worsened significantly. Initially patient had to be placed on a Ventimask. Later steroids and breathing treatments were administered in the emergency room and presently patient feels a little better. Although she is on 5 L oxygen. She was thought to be a candidate for BiPAP support but she refused it due to claustrophobia. She has yellow sputum. No fever. No  recent antibiotic use. Follows with pulmonary Dr. Welton Flakes. Has mild chronic orthopnea. No lower extremity edema. Chest x-ray shows no acute changes other than COPD.  HOSPITAL COURSE:   * Acute COPD exacerbation with acute on chronic respiratory failure Patient was aggressively treated with oxygen support. She refused BiPAP during the hospital stay. IV steroids, scheduled nebulizers and antibiotics used. Patient improved slowly close to baseline. On the day of discharge she continues to have wheezing but she feels back to normal and is wheezing on regular days. Discharge home on oral prednisone and antibiotics. Continue home nebulizer treatment. Advised to return if there is any worsening.  * Hypertension Continue home medications  * CAD Stable  * CKD stage III is stable  * Mild elevation of BNP No signs of fluid overload at this time. No CHF.  Stable for discharge home on 2 L oxygen.  CONSULTS OBTAINED:     DRUG ALLERGIES:   Allergies  Allergen Reactions  . Nitrofurantoin Monohyd Macro Shortness Of Breath  . Levaquin [Levofloxacin] Rash  . Adhesive [Tape] Other (See Comments)    "pull skin off"  . Codeine Itching  . Crestor [Rosuvastatin] Other (See Comments)    Reaction:  Dizziness   . Duratuss G [Guaifenesin] Nausea And Vomiting  . Oxycodone-Acetaminophen Itching  . Penicillins Other (See Comments)    Unsure   . Propoxyphene Itching  . Tramadol Itching  . Morphine Itching  and Rash    DISCHARGE MEDICATIONS:   Discharge Medication List as of 07/05/2015  2:49 PM    START taking these medications   Details  !! predniSONE (DELTASONE) 50 MG tablet Take 1 tablet (50 mg total) by mouth daily with breakfast., Starting 07/05/2015, Until Discontinued, Normal     !! - Potential duplicate medications found. Please discuss with provider.    CONTINUE these medications which have CHANGED   Details  azithromycin (ZITHROMAX) 250 MG tablet Take 1 tablet (250 mg total) by mouth  daily., Starting 07/05/2015, Until Discontinued, Normal      CONTINUE these medications which have NOT CHANGED   Details  acetaminophen (TYLENOL) 325 MG tablet Take 1-2 tablets (325-650 mg total) by mouth every 4 (four) hours as needed for mild pain (or temp >/= 101 F)., Starting 04/23/2015, Until Discontinued, OTC    albuterol (PROVENTIL HFA;VENTOLIN HFA) 108 (90 Base) MCG/ACT inhaler Inhale 2 puffs into the lungs every 6 (six) hours as needed for wheezing or shortness of breath., Starting 02/15/2015, Until Discontinued, Normal    albuterol (PROVENTIL) (2.5 MG/3ML) 0.083% nebulizer solution Take 2.5 mg by nebulization every 6 (six) hours as needed for wheezing or shortness of breath. Pt mixes with her Atrovent nebulizer solution., Until Discontinued, Historical Med    aspirin EC 81 MG tablet Take 81 mg by mouth daily. , Until Discontinued, Historical Med    carvedilol (COREG) 3.125 MG tablet Take 3.125 mg by mouth 2 (two) times daily with a meal., Until Discontinued, Historical Med    cholecalciferol (VITAMIN D) 1000 UNITS tablet Take 1,000 Units by mouth daily. Reported on 06/14/2015, Until Discontinued, Historical Med    clopidogrel (PLAVIX) 75 MG tablet Take 1 tablet (75 mg total) by mouth daily., Starting 04/23/2015, Until Discontinued, No Print    dextromethorphan-guaiFENesin (MUCINEX DM) 30-600 MG 12hr tablet Take 1 tablet by mouth 2 (two) times daily as needed. , Until Discontinued, Historical Med    diclofenac sodium (VOLTAREN) 1 % GEL Apply 2 g topically 4 (four) times daily as needed (for pain). Reported on 06/14/2015, Until Discontinued, Historical Med    ferrous sulfate 325 (65 FE) MG tablet Take 325 mg by mouth daily. , Until Discontinued, Historical Med    fexofenadine (ALLEGRA) 180 MG tablet Take 180 mg by mouth daily., Until Discontinued, Historical Med    furosemide (LASIX) 20 MG tablet Take 20 mg by mouth daily., Until Discontinued, Historical Med    ipratropium (ATROVENT) 0.02  % nebulizer solution Take 2.5 mLs (0.5 mg total) by nebulization every 6 (six) hours as needed for wheezing or shortness of breath. Pt mixes with her albuterol nebulizer solution., Starting 04/23/2015, Until Discontinued, Normal    lansoprazole (PREVACID) 30 MG capsule Take 30 mg by mouth daily. , Until Discontinued, Historical Med    levothyroxine (SYNTHROID, LEVOTHROID) 75 MCG tablet Take 75 mcg by mouth daily before breakfast., Until Discontinued, Historical Med    meloxicam (MOBIC) 15 MG tablet TAKE ONE TABLET BY MOUTH ONCE DAILY FOR ARTHRITIS, Normal    nitroGLYCERIN (NITROSTAT) 0.4 MG SL tablet Place 0.4 mg under the tongue every 5 (five) minutes as needed for chest pain. Reported on 06/14/2015, Until Discontinued, Historical Med    Omega-3 Fatty Acids (FISH OIL) 1000 MG CAPS Take 1,000 mg by mouth daily. Reported on 06/14/2015, Until Discontinued, Historical Med    potassium chloride SA (K-DUR,KLOR-CON) 20 MEQ tablet Take 20 mEq by mouth 2 (two) times daily., Until Discontinued, Historical Med    Potassium  Gluconate 595 MG CAPS Take 1 capsule by mouth every morning., Until Discontinued, Historical Med    !! predniSONE (DELTASONE) 10 MG tablet Take 1 tablet (10 mg total) by mouth daily with breakfast. RESTART THIS AFTER FINISHING OFF THE PREDNISONE TAPER IN 12 DAYS, Starting 02/08/2015, Until Discontinued, Normal    vitamin C (ASCORBIC ACID) 500 MG tablet Take 500 mg by mouth daily. Reported on 06/14/2015, Until Discontinued, Historical Med    HYDROcodone-acetaminophen (NORCO) 5-325 MG tablet Take 1 tablet by mouth every 6 (six) hours as needed for moderate pain., Starting 05/26/2015, Until Discontinued, Print    ketorolac (TORADOL) 10 MG tablet Take 1 tablet (10 mg total) by mouth every 6 (six) hours as needed for moderate pain., Starting 04/23/2015, Until Discontinued, Phone In     !! - Potential duplicate medications found. Please discuss with provider.      Today   VITAL SIGNS:  Blood  pressure 109/55, pulse 69, temperature 98 F (36.7 C), temperature source Oral, resp. rate 24, height 5' (1.524 m), weight 56.291 kg (124 lb 1.6 oz), SpO2 94 %.  I/O:  No intake or output data in the 24 hours ending 07/07/15 1622  PHYSICAL EXAMINATION:  Physical Exam  GENERAL:  82 y.o.-year-old patient lying in the bed with no acute distress.  LUNGS: Normal breath sounds bilaterally, no wheezing, rales,rhonchi or crepitation. No use of accessory muscles of respiration.  CARDIOVASCULAR: S1, S2 normal. No murmurs, rubs, or gallops.  ABDOMEN: Soft, non-tender, non-distended. Bowel sounds present. No organomegaly or mass.  NEUROLOGIC: Moves all 4 extremities. PSYCHIATRIC: The patient is alert and oriented x 3.  SKIN: No obvious rash, lesion, or ulcer.   DATA REVIEW:   CBC  Recent Labs Lab 07/04/15 0459  WBC 7.2  HGB 11.5*  HCT 33.9*  PLT 242    Chemistries   Recent Labs Lab 07/03/15 1056 07/04/15 0459  NA 135 134*  K 4.0 4.6  CL 97* 98*  CO2 28 27  GLUCOSE 125* 146*  BUN 26* 32*  CREATININE 1.22* 1.24*  CALCIUM 8.7* 8.5*  AST 25  --   ALT 14  --   ALKPHOS 64  --   BILITOT 0.7  --     Cardiac Enzymes  Recent Labs Lab 07/03/15 1056  TROPONINI 0.03    Microbiology Results  Results for orders placed or performed during the hospital encounter of 07/03/15  Culture, sputum-assessment     Status: None   Collection Time: 07/04/15  2:30 AM  Result Value Ref Range Status   Specimen Description EXPECTORATED SPUTUM  Final   Special Requests Normal  Final   Sputum evaluation THIS SPECIMEN IS ACCEPTABLE FOR SPUTUM CULTURE  Final   Report Status 07/04/2015 FINAL  Final  Culture, respiratory (NON-Expectorated)     Status: None   Collection Time: 07/04/15  2:30 AM  Result Value Ref Range Status   Specimen Description EXPECTORATED SPUTUM  Final   Special Requests Normal Reflexed from [id number]  Final   Gram Stain   Final    RARE WBC SEEN FEW GRAM POSITIVE COCCI FEW  GRAM NEGATIVE RODS RARE GRAM NEGATIVE COCCOBACILLI    Culture Consistent with normal respiratory flora.  Final   Report Status 07/07/2015 FINAL  Final    RADIOLOGY:  No results found.  Follow up with PCP in 1 week.  Management plans discussed with the patient, family and they are in agreement.  CODE STATUS:  Code Status History    Date Active Date  Inactive Code Status Order ID Comments User Context   07/03/2015 12:49 PM 07/05/2015  6:39 PM DNR [id number]  Milagros Loll, MD ED   04/21/2015  9:29 AM 04/23/2015  5:46 PM Full Code [id number]  Erin Fulling, MD Inpatient   02/07/2015 12:11 AM 02/08/2015  4:53 PM DNR [id number]  Ramonita Lab, MD Inpatient   02/06/2015  9:48 PM 02/07/2015 12:11 AM DNR [id number]  Ramonita Lab, MD ED    Questions for Most Recent Historical Code Status (Order [id number])    Question Answer Comment   In the event of cardiac or respiratory ARREST Do not call a "code blue"    In the event of cardiac or respiratory ARREST Do not perform Intubation, CPR, defibrillation or ACLS    In the event of cardiac or respiratory ARREST Use medication by any route, position, wound care, and other measures to relive pain and suffering. May use oxygen, suction and manual treatment of airway obstruction as needed for comfort.     Advance Directive Documentation        Most Recent Value   Type of Advance Directive  Healthcare Power of Attorney, Living will   Pre-existing out of facility DNR order (yellow form or pink MOST form)     "MOST" Form in Place?        TOTAL TIME TAKING CARE OF THIS PATIENT ON DAY OF DISCHARGE: more than 30 minutes.   Milagros Loll R M.D on 07/07/2015 at 4:22 PM  Between 7am to 6pm - Pager - [phone]  After 6pm go to [email or web address] - password [email or web address]  [facility]  Office  [phone]  CC: Primary care physician; Mila Merry, MD  Note: This dictation was prepared with Dragon dictation along with smaller phrase technology. Any  transcriptional errors that result from this process are unintentional.

## 2015-07-09 ENCOUNTER — Other Ambulatory Visit: Payer: Self-pay | Admitting: Family Medicine

## 2015-07-12 DIAGNOSIS — T8189XD Other complications of procedures, not elsewhere classified, subsequent encounter: Secondary | ICD-10-CM | POA: Diagnosis not present

## 2015-07-12 DIAGNOSIS — I70223 Atherosclerosis of native arteries of extremities with rest pain, bilateral legs: Secondary | ICD-10-CM | POA: Diagnosis not present

## 2015-07-12 DIAGNOSIS — J449 Chronic obstructive pulmonary disease, unspecified: Secondary | ICD-10-CM | POA: Diagnosis not present

## 2015-07-12 DIAGNOSIS — I10 Essential (primary) hypertension: Secondary | ICD-10-CM | POA: Diagnosis not present

## 2015-07-12 DIAGNOSIS — I2510 Atherosclerotic heart disease of native coronary artery without angina pectoris: Secondary | ICD-10-CM | POA: Diagnosis not present

## 2015-07-15 ENCOUNTER — Inpatient Hospital Stay: Payer: Commercial Managed Care - HMO | Admitting: Family Medicine

## 2015-07-15 ENCOUNTER — Other Ambulatory Visit: Payer: Self-pay

## 2015-07-15 ENCOUNTER — Other Ambulatory Visit: Payer: Self-pay | Admitting: *Deleted

## 2015-07-15 DIAGNOSIS — I70223 Atherosclerosis of native arteries of extremities with rest pain, bilateral legs: Secondary | ICD-10-CM | POA: Diagnosis not present

## 2015-07-15 DIAGNOSIS — J449 Chronic obstructive pulmonary disease, unspecified: Secondary | ICD-10-CM | POA: Diagnosis not present

## 2015-07-15 DIAGNOSIS — I10 Essential (primary) hypertension: Secondary | ICD-10-CM | POA: Diagnosis not present

## 2015-07-15 DIAGNOSIS — T8189XD Other complications of procedures, not elsewhere classified, subsequent encounter: Secondary | ICD-10-CM | POA: Diagnosis not present

## 2015-07-15 DIAGNOSIS — I2510 Atherosclerotic heart disease of native coronary artery without angina pectoris: Secondary | ICD-10-CM | POA: Diagnosis not present

## 2015-07-15 MED ORDER — LANSOPRAZOLE 30 MG PO CPDR: 30.0000 mg | DELAYED_RELEASE_CAPSULE | Freq: Every day | ORAL | Status: DC

## 2015-07-15 NOTE — Patient Outreach (Signed)
[facility] ([facility]) Care Management   07/15/2015  Torrin W Mccain 05/08/1925 [id number]  Landis W Matulich is an 82 y.o. female  Subjective:   Pt reports breathing better,finished antibiotic,  had 3 good days, cooked.   Pt reports saw vein MD's PA, wound vac came off prior to visit, told to keep off, right groin wound healed.  Pt reports saw Dr. Khan 2 days, to f/u again 6/6.   Pt reports has an occasional cough, secretions white, doing her nebulizer treatment.   Pt reports appetite is good.      Objective:   Filed Vitals:   07/15/15 1047  BP: 130/58  Pulse: 73  Resp: 24    ROS  Physical Exam  Constitutional: She is oriented to person, place, and time. She appears well-developed and well-nourished.  Cardiovascular: Normal rate, regular rhythm and normal heart sounds.   Respiratory: Effort normal and breath sounds normal.  On Oxygen 2.5 L [place]   GI: Soft. Bowel sounds are normal.  Musculoskeletal: Normal range of motion.  Neurological: She is alert and oriented to person, place, and time.  Skin: Skin is warm and dry.  Right groin wound healed. Left groin site has scab intact, no s/s of infection noted.   Psychiatric: She has a normal mood and affect. Her behavior is normal. Judgment and thought content normal.    Encounter Medications:   Outpatient Encounter Prescriptions as of 07/15/2015  Medication Sig Note  . acetaminophen (TYLENOL) 325 MG tablet Take 1-2 tablets (325-650 mg total) by mouth every 4 (four) hours as needed for mild pain (or temp >/= 101 F). 06/14/2015: As needed   . albuterol (PROVENTIL HFA;VENTOLIN HFA) 108 (90 Base) MCG/ACT inhaler Inhale 2 puffs into the lungs every 6 (six) hours as needed for wheezing or shortness of breath.   . albuterol (PROVENTIL) (2.5 MG/3ML) 0.083% nebulizer solution Take 2.5 mg by nebulization every 6 (six) hours as needed for wheezing or shortness of breath. Pt mixes with her Atrovent nebulizer solution.   . aspirin EC 81 MG tablet Take  81 mg by mouth daily.    . carvedilol (COREG) 3.125 MG tablet Take 3.125 mg by mouth 2 (two) times daily with a meal.   . cholecalciferol (VITAMIN D) 1000 UNITS tablet Take 1,000 Units by mouth daily. Reported on 06/14/2015   . clopidogrel (PLAVIX) 75 MG tablet Take 1 tablet (75 mg total) by mouth daily.   . dextromethorphan-guaiFENesin (MUCINEX DM) 30-600 MG 12hr tablet Take 1 tablet by mouth 2 (two) times daily as needed.    . ferrous sulfate 325 (65 FE) MG tablet Take 325 mg by mouth daily.    . fexofenadine (ALLEGRA) 180 MG tablet Take 180 mg by mouth daily.   . furosemide (LASIX) 20 MG tablet Take 20 mg by mouth daily.   . ipratropium (ATROVENT) 0.02 % nebulizer solution Take 2.5 mLs (0.5 mg total) by nebulization every 6 (six) hours as needed for wheezing or shortness of breath. Pt mixes with her albuterol nebulizer solution.   . lansoprazole (PREVACID) 30 MG capsule Take 30 mg by mouth daily.    . levothyroxine (SYNTHROID, LEVOTHROID) 75 MCG tablet Take 75 mcg by mouth daily before breakfast.   . nitroGLYCERIN (NITROSTAT) 0.4 MG SL tablet Place 0.4 mg under the tongue every 5 (five) minutes as needed for chest pain. Reported on 06/14/2015 07/15/2015: Available if needed.   . potassium chloride SA (K-DUR,KLOR-CON) 20 MEQ tablet Take 20 mEq by mouth 2 (two)   times daily.   . predniSONE (DELTASONE) 10 MG tablet Take 1 tablet (10 mg total) by mouth daily with breakfast. RESTART THIS AFTER FINISHING OFF THE PREDNISONE TAPER IN 12 DAYS   . azithromycin (ZITHROMAX) 250 MG tablet Take 1 tablet (250 mg total) by mouth daily. (Patient not taking: Reported on 07/15/2015) 07/15/2015: Completed.   . diclofenac sodium (VOLTAREN) 1 % GEL Apply 2 g topically 4 (four) times daily as needed (for pain). Reported on 07/15/2015   . HYDROcodone-acetaminophen (NORCO) 5-325 MG tablet Take 1 tablet by mouth every 6 (six) hours as needed for moderate pain. (Patient not taking: Reported on 07/03/2015)   . ketorolac (TORADOL) 10 MG  tablet Take 1 tablet (10 mg total) by mouth every 6 (six) hours as needed for moderate pain. (Patient not taking: Reported on 07/03/2015)   . meloxicam (MOBIC) 15 MG tablet TAKE ONE TABLET BY MOUTH ONCE DAILY FOR ARTHRITIS (Patient not taking: Reported on 07/15/2015)   . Omega-3 Fatty Acids (FISH OIL) 1000 MG CAPS Take 1,000 mg by mouth daily. Reported on 07/15/2015   . Potassium Gluconate 595 MG CAPS Take 1 capsule by mouth every morning. Reported on 07/15/2015   . predniSONE (DELTASONE) 50 MG tablet Take 1 tablet (50 mg total) by mouth daily with breakfast. (Patient not taking: Reported on 07/15/2015) 07/15/2015: Completed   . vitamin C (ASCORBIC ACID) 500 MG tablet Take 500 mg by mouth daily. Reported on 07/15/2015    No facility-administered encounter medications on file as of 07/15/2015.    Functional Status:   In your present state of health, do you have any difficulty performing the following activities: 07/03/2015 06/14/2015  Hearing? N N  Vision? N Y  Difficulty concentrating or making decisions? N N  Walking or climbing stairs? N Y  Dressing or bathing? N N  Doing errands, shopping? [place]  Preparing Food and eating ? - Y  Using the Toilet? - N  In the past six months, have you accidently leaked urine? - N  Do you have problems with loss of bowel control? - N  Managing your Medications? - N  Managing your Finances? - Y  Housekeeping or managing your Housekeeping? - Y    Fall/Depression Screening:    PHQ 2/9 Scores 06/14/2015 02/18/2015 02/10/2015 01/20/2015  PHQ - 2 Score 1 0 0 0    Assessment:  Pleasant 82 year old woman, lives alone, 2 nieces very supportive assist as needed.                          Home health nurse arrived during home visit.                          COPD- lungs clear. O 2 saturation 96% on 2.5 Liters [place].  No edema.                           Skin integrity- right groin wound healed, left groin has scab- intact.   Plan: Pt to f/u with Dr. Humphrey Rolls 6/6.             Plan to  continue to follow pt for transition of care, update care plan as needed,  f/u again telephonically 6/9.   [facility] CM Care Plan Problem One        Most Recent Value   Care Plan Problem One  Skin integrity -  right groin wound    Role Documenting the Problem One  Care Management [place] for Problem One  Active   [facility] Long Term Goal (31-90 days)  Pt would continue to see healing in right groin wound in the next 40 days    [facility] Long Term Goal Start Date  06/14/15   [facility] Long Term Goal Met Date  07/15/15   Interventions for Problem One Long Term Goal  Reviewed with pt skin integrity- pt reports healing.     [facility] CM Care Plan Problem Three        Most Recent Value   Care Plan Problem Three  Risk for readmission related to recent hospitalization for COPD    Role Documenting the Problem Three  Care Management Coordinator   Care Plan for Problem Three  Active   [facility] Long Term Goal (31-90) days  Pt would not readmit 31 days from day of discharge    [place] Term Goal Start Date  07/06/15   Interventions for Problem Three Long Term Goal  Discussed with pt avoiding going outdoors with extreme weather.    [facility] CM Short Term Goal #1 (0-30 days)  Pt would continue to be compliant with respiratory medications in the next 30 days    [facility] CM Short Term Goal #1 Start Date  07/06/15   Interventions for Short Term Goal #1  Reinforced with pt ongoing compliance with respiratory medications, use of rescue inhaler as needed.       Zara Chess.   [place] [facility] Care Management  [phone]

## 2015-07-15 NOTE — Telephone Encounter (Signed)
Refill request from [facility] pharmacy, please review-aa 

## 2015-07-18 DIAGNOSIS — I70223 Atherosclerosis of native arteries of extremities with rest pain, bilateral legs: Secondary | ICD-10-CM | POA: Diagnosis not present

## 2015-07-18 DIAGNOSIS — I10 Essential (primary) hypertension: Secondary | ICD-10-CM | POA: Diagnosis not present

## 2015-07-18 DIAGNOSIS — J449 Chronic obstructive pulmonary disease, unspecified: Secondary | ICD-10-CM | POA: Diagnosis not present

## 2015-07-18 DIAGNOSIS — I2510 Atherosclerotic heart disease of native coronary artery without angina pectoris: Secondary | ICD-10-CM | POA: Diagnosis not present

## 2015-07-18 DIAGNOSIS — T8189XD Other complications of procedures, not elsewhere classified, subsequent encounter: Secondary | ICD-10-CM | POA: Diagnosis not present

## 2015-07-21 DIAGNOSIS — Z7982 Long term (current) use of aspirin: Secondary | ICD-10-CM | POA: Diagnosis not present

## 2015-07-21 DIAGNOSIS — Z9981 Dependence on supplemental oxygen: Secondary | ICD-10-CM | POA: Diagnosis not present

## 2015-07-21 DIAGNOSIS — Z7902 Long term (current) use of antithrombotics/antiplatelets: Secondary | ICD-10-CM | POA: Diagnosis not present

## 2015-07-21 DIAGNOSIS — I2510 Atherosclerotic heart disease of native coronary artery without angina pectoris: Secondary | ICD-10-CM | POA: Diagnosis not present

## 2015-07-21 DIAGNOSIS — J449 Chronic obstructive pulmonary disease, unspecified: Secondary | ICD-10-CM | POA: Diagnosis not present

## 2015-07-21 DIAGNOSIS — I10 Essential (primary) hypertension: Secondary | ICD-10-CM | POA: Diagnosis not present

## 2015-07-21 DIAGNOSIS — I70223 Atherosclerosis of native arteries of extremities with rest pain, bilateral legs: Secondary | ICD-10-CM | POA: Diagnosis not present

## 2015-07-21 DIAGNOSIS — T8189XD Other complications of procedures, not elsewhere classified, subsequent encounter: Secondary | ICD-10-CM | POA: Diagnosis not present

## 2015-07-22 ENCOUNTER — Other Ambulatory Visit: Payer: Self-pay | Admitting: *Deleted

## 2015-07-22 NOTE — Patient Outreach (Signed)
Transition of care (week 3, discharged 5/23).  Pt reports left groin scab came off, wound healing.  Pt reports her breathing is good, cleaned her house today.   Pt reports her brother died 6/6, cancelled appointment with Dr. Welton FlakesKhan, rescheduled for 6/13.  Pt reports taking all of her respiratory medications, HH RN to discharge next week.  As discussed with pt, plan to f/u again telephonically 6/16- part of ongoing transition of care.     [name]   Pierzchala RN CCM [facility] Care Management  [phone]

## 2015-07-24 ENCOUNTER — Encounter: Payer: Self-pay | Admitting: Family Medicine

## 2015-07-26 DIAGNOSIS — R0602 Shortness of breath: Secondary | ICD-10-CM | POA: Diagnosis not present

## 2015-07-26 DIAGNOSIS — J449 Chronic obstructive pulmonary disease, unspecified: Secondary | ICD-10-CM | POA: Diagnosis not present

## 2015-07-26 DIAGNOSIS — J9611 Chronic respiratory failure with hypoxia: Secondary | ICD-10-CM | POA: Diagnosis not present

## 2015-07-28 DIAGNOSIS — I10 Essential (primary) hypertension: Secondary | ICD-10-CM | POA: Diagnosis not present

## 2015-07-28 DIAGNOSIS — T8189XD Other complications of procedures, not elsewhere classified, subsequent encounter: Secondary | ICD-10-CM | POA: Diagnosis not present

## 2015-07-28 DIAGNOSIS — J449 Chronic obstructive pulmonary disease, unspecified: Secondary | ICD-10-CM | POA: Diagnosis not present

## 2015-07-28 DIAGNOSIS — I2510 Atherosclerotic heart disease of native coronary artery without angina pectoris: Secondary | ICD-10-CM | POA: Diagnosis not present

## 2015-07-28 DIAGNOSIS — J441 Chronic obstructive pulmonary disease with (acute) exacerbation: Secondary | ICD-10-CM | POA: Diagnosis not present

## 2015-07-28 DIAGNOSIS — I70223 Atherosclerosis of native arteries of extremities with rest pain, bilateral legs: Secondary | ICD-10-CM | POA: Diagnosis not present

## 2015-07-29 ENCOUNTER — Other Ambulatory Visit: Payer: Self-pay | Admitting: *Deleted

## 2015-07-29 NOTE — Patient Outreach (Signed)
Transition of care call successful (week 4, discharged 5/23).   Spoke with Cynthia [name] who reports left groin wound is healed, HH RN released her yesterday.  Cynthia [name] reports on recent visit with Dr. Welton FlakesKhan, got a good report, to f/u  With MD again in 3 months.  Cynthia [name] reports appetite good, taking all of her medications.   Discussed with Cynthia [name] coworker Pam RN CM (covering for this RN CM) will be f/u with her next week for final transition of care call.     [facility] (covering for this RN CM part of next week).   [name]   Pierzchala RN CCM [facility] Care Management  [phone]

## 2015-08-05 ENCOUNTER — Other Ambulatory Visit: Payer: Self-pay

## 2015-08-05 DIAGNOSIS — J449 Chronic obstructive pulmonary disease, unspecified: Secondary | ICD-10-CM | POA: Diagnosis not present

## 2015-08-05 NOTE — Patient Outreach (Signed)
This RNCM made successful telephone contact with patient for community care coordination. Patient identified herself by providing date of birth and address.  This RNCM provided purpose of call, in absence of her assigned care coordinator. Patient denies shortness of breath, cough.  Patient reports being consistent with medication regmien and have no other case management needs at this time.  Plan: Update assigned case management upon her return

## 2015-08-09 ENCOUNTER — Encounter: Payer: Self-pay | Admitting: *Deleted

## 2015-08-09 ENCOUNTER — Other Ambulatory Visit: Payer: Self-pay | Admitting: *Deleted

## 2015-08-09 DIAGNOSIS — J441 Chronic obstructive pulmonary disease with (acute) exacerbation: Secondary | ICD-10-CM | POA: Diagnosis not present

## 2015-08-09 NOTE — Patient Outreach (Addendum)
Follow up phone call.  Spoke with pt,check on status as coworker Pam RN CM did final transition of care call 6/23.  Pt reports doing good, no sob, outside now working in the yard.   Pt reports she has a little coughing, secretions white, taking all of her medications, nebulizer treatments help more than her oxygen.  Pt reports she is using her oxygen most of the time, takes off for a few minutes when going outdoors.  RN CM discussed with pt use of portable oxygen, reinforced avoid going outdoors during extreme heat/cold weather to which pt voiced understanding.   As discussed with pt, plan to close case, transition of care program completed, no case management needs at this time.   Plan to send case closure letter to Dr. Mila Merryonald Fisher by in basket.  As discussed with pt, case closure letter to be sent which includes [facility]'s main contact number to call if needs arise in the future.   Plan to inform [facility] care [facility] assistant to close case.   [name]   Tyrease Vandeberg RN CCM [facility] Care Management  [phone]

## 2015-09-02 ENCOUNTER — Encounter: Payer: Self-pay | Admitting: Family Medicine

## 2015-09-02 ENCOUNTER — Ambulatory Visit (INDEPENDENT_AMBULATORY_CARE_PROVIDER_SITE_OTHER): Payer: Commercial Managed Care - HMO | Admitting: Family Medicine

## 2015-09-02 VITALS — BP 122/70 | HR 106 | Temp 98.6°F | Resp 20 | Wt 130.0 lb

## 2015-09-02 DIAGNOSIS — G4700 Insomnia, unspecified: Secondary | ICD-10-CM | POA: Diagnosis not present

## 2015-09-02 DIAGNOSIS — J432 Centrilobular emphysema: Secondary | ICD-10-CM

## 2015-09-02 DIAGNOSIS — J441 Chronic obstructive pulmonary disease with (acute) exacerbation: Secondary | ICD-10-CM | POA: Diagnosis not present

## 2015-09-02 MED ORDER — BENZONATATE 100 MG PO CAPS: 200.0000 mg | ORAL_CAPSULE | Freq: Three times a day (TID) | ORAL | Status: DC | PRN

## 2015-09-02 NOTE — Progress Notes (Signed)
Patient: Cynthia [name] Female    DOB: 1925/03/08   82 y.o.   MRN: [id number] Visit Date: 09/02/2015  Today's Provider: Dortha Kernennis Zulma Court, PA   Chief Complaint  Patient presents with  . Cough   Subjective:    HPI  Patient states that her shortness of breath and cough has gotten worse in the last week. Also has had wheezing. No fever. She has been using inhaler and nebulizer as directed. With the heat outside her oxygen has been increased to 3 liters.   Past Medical History  Diagnosis Date  . Abdominal pain, epigastric   . Aneurysm of aorta (HCC)   . Osteoarthritis   . GERD (gastroesophageal reflux disease)   . Spinal stenosis of lumbar region without neurogenic claudication   . Insomnia   . Hypercholesteremia   . Osteoporosis   . Hypertension   . Stomatitis monilial   . CAD (coronary artery disease)   . Renal failure   . UTI symptoms   . Stroke (HCC)   . Allergic drug reaction   . Breathlessness on exertion   . Asthma   . COPD (chronic obstructive pulmonary disease) (HCC)   . Seborrheic keratoses   . Olecranon bursitis   . History of partial colectomy   . Pneumonia   . Hypoxia   . Nocturia   . Vaginal atrophy   . Hypothyroidism    Past Surgical History  Procedure Laterality Date  . Multilevel spinal fusion and laminectomy  2010    Due to spinal stenosis Dr. Gerrit Heckaliff  . Rotator cuff surgery  2007  . Vaginal hysterectomy  1960  . Wrist surgery      lump removed due to pain  . Colon surgery      Hole in colon that was repaired  . Appendectomy    . Peripheral vascular catheterization N/A 03/16/2015    Procedure: Abdominal Aortogram w/Lower Extremity;  Surgeon: Renford DillsGregory G Schnier, MD;  Location: [facility];  Service: Cardiovascular;  Laterality: N/A;  . Peripheral vascular catheterization  03/16/2015    Procedure: Lower Extremity Intervention;  Surgeon: Renford DillsGregory G Schnier, MD;  Location: [facility];  Service: Cardiovascular;;  . Peripheral  vascular catheterization Bilateral 04/20/2015    Procedure: Endovascular Repair/Stent Graft;  Surgeon: Renford DillsGregory G Schnier, MD;  Location: [facility];  Service: Cardiovascular;  Laterality: Bilateral;  . Back surgery    . Eye surgery Bilateral     Cataract extraction with IOL  . Wound debridement Right 05/26/2015    Procedure: DEBRIDEMENT WOUND ( RIGHT GROIN DEBRIDEMENT );  Surgeon: Annice NeedyJason S Dew, MD;  Location: [facility];  Service: Vascular;  Laterality: Right;  . Application of wound vac Right 05/26/2015    Procedure: APPLICATION OF WOUND VAC;  Surgeon: Annice NeedyJason S Dew, MD;  Location: [facility] OR[facility];  Service: Vascular;  Laterality: Right;   Family History  Problem Relation Age of Onset  . COPD Sister   . Diabetes Sister   . Heart disease Brother   . Cancer Brother   . Hypertension Brother   . Diabetes Brother      Allergies  Allergen Reactions  . Nitrofurantoin Monohyd Macro Shortness Of Breath  . Levaquin [Levofloxacin] Rash  . Adhesive [Tape] Other (See Comments)    "pull skin off"  . Codeine Itching  . Crestor [Rosuvastatin] Other (See Comments)    Reaction:  Dizziness   . Duratuss G [Guaifenesin] Nausea And Vomiting  . Oxycodone-Acetaminophen  Itching  . Penicillins Other (See Comments)    Unsure   . Propoxyphene Itching  . Tramadol Itching  . Morphine Itching and Rash   Current Meds  Medication Sig  . acetaminophen (TYLENOL) 325 MG tablet Take 1-2 tablets (325-650 mg total) by mouth every 4 (four) hours as needed for mild pain (or temp >/= 101 F).  Marland Kitchen albuterol (PROVENTIL HFA;VENTOLIN HFA) 108 (90 Base) MCG/ACT inhaler Inhale 2 puffs into the lungs every 6 (six) hours as needed for wheezing or shortness of breath.  Marland Kitchen albuterol (PROVENTIL) (2.5 MG/3ML) 0.083% nebulizer solution Take 2.5 mg by nebulization every 6 (six) hours as needed for wheezing or shortness of breath. Pt mixes with her Atrovent nebulizer solution.  Marland Kitchen aspirin EC 81 MG tablet Take 81 mg by mouth daily.   .  carvedilol (COREG) 3.125 MG tablet Take 3.125 mg by mouth 2 (two) times daily with a meal.  . cholecalciferol (VITAMIN D) 1000 UNITS tablet Take 1,000 Units by mouth daily. Reported on 06/14/2015  . clopidogrel (PLAVIX) 75 MG tablet Take 1 tablet (75 mg total) by mouth daily.  Marland Kitchen dextromethorphan-guaiFENesin (MUCINEX DM) 30-600 MG 12hr tablet Take 1 tablet by mouth 2 (two) times daily as needed.   . diclofenac sodium (VOLTAREN) 1 % GEL Apply 2 g topically 4 (four) times daily as needed (for pain). Reported on 07/15/2015  . ferrous sulfate 325 (65 FE) MG tablet Take 325 mg by mouth daily.   . fexofenadine (ALLEGRA) 180 MG tablet Take 180 mg by mouth daily.  . fluticasone furoate-vilanterol (BREO ELLIPTA) 200-25 MCG/INH AEPB Inhale 1 puff into the lungs daily.  . furosemide (LASIX) 20 MG tablet Take 20 mg by mouth daily.  Marland Kitchen ipratropium (ATROVENT) 0.02 % nebulizer solution Take 2.5 mLs (0.5 mg total) by nebulization every 6 (six) hours as needed for wheezing or shortness of breath. Pt mixes with her albuterol nebulizer solution.  . lansoprazole (PREVACID) 30 MG capsule Take 1 capsule (30 mg total) by mouth daily.  Marland Kitchen levothyroxine (SYNTHROID, LEVOTHROID) 75 MCG tablet TAKE 1 TABLET EVERY DAY  . Magnesium Oxide 250 MG TABS Take by mouth 2 (two) times daily.  . nitroGLYCERIN (NITROSTAT) 0.4 MG SL tablet Place 0.4 mg under the tongue every 5 (five) minutes as needed for chest pain. Reported on 06/14/2015  . Omega-3 Fatty Acids (FISH OIL) 1000 MG CAPS Take 1,000 mg by mouth daily. Reported on 07/15/2015  . Potassium Gluconate 595 MG CAPS Take 1 capsule by mouth every morning. Reported on 07/15/2015  . predniSONE (DELTASONE) 10 MG tablet Take 1 tablet (10 mg total) by mouth daily with breakfast. RESTART THIS AFTER FINISHING OFF THE PREDNISONE TAPER IN 12 DAYS    Review of Systems  Constitutional: Positive for fatigue.  Respiratory: Positive for cough, chest tightness, shortness of breath and wheezing.     Cardiovascular: Negative.   Musculoskeletal: Positive for arthralgias and gait problem.    Social History  Substance Use Topics  . Smoking status: Former Smoker -- 0.50 packs/day    Types: Cigarettes  . Smokeless tobacco: Never Used     Comment: quit 11 years ago  . Alcohol Use: No     Comment: 2 oz of wine at night   Objective:   BP 122/70 mmHg  Pulse 106  Temp(Src) 98.6 F (37 C)  Resp 20  Wt 130 lb (58.968 kg)  SpO2 97%(on 3LPM oxygen).  Physical Exam  Constitutional: She is oriented to person, place, and time. She appears  well-developed and well-nourished.  HENT:  Head: Normocephalic.  Right Ear: External ear normal.  Left Ear: External ear normal.  Nose: Nose normal.  Eyes: Conjunctivae are normal.  Neck: Neck supple.  Cardiovascular: Normal rate and regular rhythm.   Pulmonary/Chest: She is in respiratory distress. She has wheezes. She has no rales. She exhibits no tenderness.  Mild wheeze with very distant breath sounds. Labored breathing.  Abdominal: Soft. Bowel sounds are normal.  Musculoskeletal: She exhibits no edema.  Fair ROM with weak pulses in feet. Slight redness but no edema.  Neurological: She is alert and oriented to person, place, and time.      Assessment & Plan:     1. COPD exacerbation (HCC) Flare of cough and congestion with dyspnea over the past week without fever or sore throat. Has increased oxygen use to 3 LPM. Using Breo, Albuterol-MDI, Atrovent with Albuterol by nebulizer and Mucinex-DM. Still feels cough is not well controlled and disturbs sleep. Will add Tessalon Perles and recheck prn. Continue pulmonology follow up with Dr. Welton Flakes. - benzonatate (TESSALON) 100 MG capsule; Take 2 capsules (200 mg total) by mouth 3 (three) times daily as needed for cough.  Dispense: 30 capsule; Refill: 3  2. Centriacinar emphysema (HCC) Chronic hypoxia, dyspnea and chronic airflow limitations. Should continue COPD treatment daily and use of oxygen 24  hours a day. Recheck prn.  3. Insomnia Poor sleep pattern associated with emphysema, oxygen use and living alone. May try Melatonin or Tylenol-PM.       Dortha Kern, PA  [facility]

## 2015-09-04 DIAGNOSIS — J449 Chronic obstructive pulmonary disease, unspecified: Secondary | ICD-10-CM | POA: Diagnosis not present

## 2015-10-05 DIAGNOSIS — J449 Chronic obstructive pulmonary disease, unspecified: Secondary | ICD-10-CM | POA: Diagnosis not present

## 2015-10-19 ENCOUNTER — Telehealth: Payer: Self-pay | Admitting: Family Medicine

## 2015-10-19 NOTE — Telephone Encounter (Signed)
Arline AspCindy stated that pt last OV she forgot to get the RX/Order for pt to get a transport wheelchair. Arline AspCindy is requesting we fax the order to her at Fax# [phone] Atten: Jola Babinskiindy Cates. Please advise. Thanks TNP

## 2015-10-19 NOTE — Telephone Encounter (Signed)
OK 

## 2015-10-19 NOTE — Telephone Encounter (Signed)
Please advise 

## 2015-10-20 NOTE — Telephone Encounter (Signed)
Rx for transport wheelchair printed and given to provider to sign.

## 2015-10-21 NOTE — Telephone Encounter (Signed)
Order faxed.

## 2015-10-27 DIAGNOSIS — I10 Essential (primary) hypertension: Secondary | ICD-10-CM | POA: Diagnosis not present

## 2015-10-27 DIAGNOSIS — I714 Abdominal aortic aneurysm, without rupture: Secondary | ICD-10-CM | POA: Diagnosis not present

## 2015-10-27 DIAGNOSIS — R238 Other skin changes: Secondary | ICD-10-CM | POA: Diagnosis not present

## 2015-10-27 DIAGNOSIS — R233 Spontaneous ecchymoses: Secondary | ICD-10-CM | POA: Insufficient documentation

## 2015-10-27 DIAGNOSIS — I739 Peripheral vascular disease, unspecified: Secondary | ICD-10-CM | POA: Diagnosis not present

## 2015-11-01 DIAGNOSIS — R0602 Shortness of breath: Secondary | ICD-10-CM | POA: Diagnosis not present

## 2015-11-01 DIAGNOSIS — Z23 Encounter for immunization: Secondary | ICD-10-CM | POA: Diagnosis not present

## 2015-11-01 DIAGNOSIS — J449 Chronic obstructive pulmonary disease, unspecified: Secondary | ICD-10-CM | POA: Diagnosis not present

## 2015-11-01 DIAGNOSIS — J9611 Chronic respiratory failure with hypoxia: Secondary | ICD-10-CM | POA: Diagnosis not present

## 2015-11-03 DIAGNOSIS — J449 Chronic obstructive pulmonary disease, unspecified: Secondary | ICD-10-CM | POA: Diagnosis not present

## 2015-11-03 DIAGNOSIS — M4806 Spinal stenosis, lumbar region: Secondary | ICD-10-CM | POA: Diagnosis not present

## 2015-11-05 DIAGNOSIS — J449 Chronic obstructive pulmonary disease, unspecified: Secondary | ICD-10-CM | POA: Diagnosis not present

## 2015-11-14 ENCOUNTER — Inpatient Hospital Stay
Admission: EM | Admit: 2015-11-14 | Discharge: 2015-11-17 | DRG: 189 | Disposition: A | Discharge status: Home / Self Care | Payer: Commercial Managed Care - HMO | Attending: Internal Medicine | Admitting: Internal Medicine

## 2015-11-14 ENCOUNTER — Other Ambulatory Visit: Payer: Self-pay

## 2015-11-14 ENCOUNTER — Emergency Department: Payer: Commercial Managed Care - HMO

## 2015-11-14 DIAGNOSIS — Z87891 Personal history of nicotine dependence: Secondary | ICD-10-CM

## 2015-11-14 DIAGNOSIS — J441 Chronic obstructive pulmonary disease with (acute) exacerbation: Secondary | ICD-10-CM | POA: Diagnosis present

## 2015-11-14 DIAGNOSIS — J9621 Acute and chronic respiratory failure with hypoxia: Secondary | ICD-10-CM | POA: Diagnosis not present

## 2015-11-14 DIAGNOSIS — N183 Chronic kidney disease, stage 3 (moderate): Secondary | ICD-10-CM | POA: Diagnosis present

## 2015-11-14 DIAGNOSIS — R600 Localized edema: Secondary | ICD-10-CM | POA: Diagnosis present

## 2015-11-14 DIAGNOSIS — Z66 Do not resuscitate: Secondary | ICD-10-CM | POA: Diagnosis present

## 2015-11-14 DIAGNOSIS — I129 Hypertensive chronic kidney disease with stage 1 through stage 4 chronic kidney disease, or unspecified chronic kidney disease: Secondary | ICD-10-CM | POA: Diagnosis present

## 2015-11-14 DIAGNOSIS — Z881 Allergy status to other antibiotic agents status: Secondary | ICD-10-CM

## 2015-11-14 DIAGNOSIS — Z885 Allergy status to narcotic agent status: Secondary | ICD-10-CM

## 2015-11-14 DIAGNOSIS — Z7902 Long term (current) use of antithrombotics/antiplatelets: Secondary | ICD-10-CM

## 2015-11-14 DIAGNOSIS — Z8673 Personal history of transient ischemic attack (TIA), and cerebral infarction without residual deficits: Secondary | ICD-10-CM

## 2015-11-14 DIAGNOSIS — I214 Non-ST elevation (NSTEMI) myocardial infarction: Secondary | ICD-10-CM | POA: Diagnosis not present

## 2015-11-14 DIAGNOSIS — E7800 Pure hypercholesterolemia, unspecified: Secondary | ICD-10-CM | POA: Diagnosis present

## 2015-11-14 DIAGNOSIS — R739 Hyperglycemia, unspecified: Secondary | ICD-10-CM | POA: Diagnosis present

## 2015-11-14 DIAGNOSIS — K219 Gastro-esophageal reflux disease without esophagitis: Secondary | ICD-10-CM | POA: Diagnosis present

## 2015-11-14 DIAGNOSIS — T447X5A Adverse effect of beta-adrenoreceptor antagonists, initial encounter: Secondary | ICD-10-CM | POA: Diagnosis present

## 2015-11-14 DIAGNOSIS — I959 Hypotension, unspecified: Secondary | ICD-10-CM | POA: Diagnosis not present

## 2015-11-14 DIAGNOSIS — I739 Peripheral vascular disease, unspecified: Secondary | ICD-10-CM | POA: Diagnosis not present

## 2015-11-14 DIAGNOSIS — N289 Disorder of kidney and ureter, unspecified: Secondary | ICD-10-CM

## 2015-11-14 DIAGNOSIS — Z79899 Other long term (current) drug therapy: Secondary | ICD-10-CM | POA: Diagnosis not present

## 2015-11-14 DIAGNOSIS — M810 Age-related osteoporosis without current pathological fracture: Secondary | ICD-10-CM | POA: Diagnosis present

## 2015-11-14 DIAGNOSIS — I248 Other forms of acute ischemic heart disease: Secondary | ICD-10-CM | POA: Diagnosis not present

## 2015-11-14 DIAGNOSIS — J9600 Acute respiratory failure, unspecified whether with hypoxia or hypercapnia: Secondary | ICD-10-CM | POA: Diagnosis present

## 2015-11-14 DIAGNOSIS — I714 Abdominal aortic aneurysm, without rupture: Secondary | ICD-10-CM | POA: Diagnosis present

## 2015-11-14 DIAGNOSIS — I2510 Atherosclerotic heart disease of native coronary artery without angina pectoris: Secondary | ICD-10-CM | POA: Diagnosis not present

## 2015-11-14 DIAGNOSIS — M6281 Muscle weakness (generalized): Secondary | ICD-10-CM

## 2015-11-14 DIAGNOSIS — Z7952 Long term (current) use of systemic steroids: Secondary | ICD-10-CM | POA: Diagnosis not present

## 2015-11-14 DIAGNOSIS — Z825 Family history of asthma and other chronic lower respiratory diseases: Secondary | ICD-10-CM

## 2015-11-14 DIAGNOSIS — E039 Hypothyroidism, unspecified: Secondary | ICD-10-CM | POA: Diagnosis present

## 2015-11-14 DIAGNOSIS — R0603 Acute respiratory distress: Secondary | ICD-10-CM | POA: Diagnosis not present

## 2015-11-14 DIAGNOSIS — N179 Acute kidney failure, unspecified: Secondary | ICD-10-CM | POA: Diagnosis present

## 2015-11-14 DIAGNOSIS — I509 Heart failure, unspecified: Secondary | ICD-10-CM | POA: Diagnosis not present

## 2015-11-14 DIAGNOSIS — J9620 Acute and chronic respiratory failure, unspecified whether with hypoxia or hypercapnia: Secondary | ICD-10-CM | POA: Diagnosis not present

## 2015-11-14 DIAGNOSIS — T380X5A Adverse effect of glucocorticoids and synthetic analogues, initial encounter: Secondary | ICD-10-CM | POA: Diagnosis present

## 2015-11-14 DIAGNOSIS — Z8249 Family history of ischemic heart disease and other diseases of the circulatory system: Secondary | ICD-10-CM

## 2015-11-14 DIAGNOSIS — J9601 Acute respiratory failure with hypoxia: Secondary | ICD-10-CM | POA: Diagnosis not present

## 2015-11-14 DIAGNOSIS — Z833 Family history of diabetes mellitus: Secondary | ICD-10-CM

## 2015-11-14 DIAGNOSIS — R7989 Other specified abnormal findings of blood chemistry: Secondary | ICD-10-CM | POA: Diagnosis not present

## 2015-11-14 DIAGNOSIS — R0600 Dyspnea, unspecified: Secondary | ICD-10-CM | POA: Diagnosis not present

## 2015-11-14 DIAGNOSIS — I10 Essential (primary) hypertension: Secondary | ICD-10-CM | POA: Diagnosis not present

## 2015-11-14 DIAGNOSIS — R0602 Shortness of breath: Secondary | ICD-10-CM | POA: Diagnosis not present

## 2015-11-14 DIAGNOSIS — Y92009 Unspecified place in unspecified non-institutional (private) residence as the place of occurrence of the external cause: Secondary | ICD-10-CM

## 2015-11-14 DIAGNOSIS — Z7982 Long term (current) use of aspirin: Secondary | ICD-10-CM

## 2015-11-14 LAB — CBC
HEMATOCRIT: 37.2 % (ref 35.0–47.0)
HEMOGLOBIN: 12.5 g/dL (ref 12.0–16.0)
MCH: 31.9 pg (ref 26.0–34.0)
MCHC: 33.7 g/dL (ref 32.0–36.0)
MCV: 94.6 fL (ref 80.0–100.0)
Platelets: 246 10*3/uL (ref 150–440)
RBC: 3.93 MIL/uL (ref 3.80–5.20)
RDW: 13.8 % (ref 11.5–14.5)
WBC: 9.8 10*3/uL (ref 3.6–11.0)

## 2015-11-14 LAB — BASIC METABOLIC PANEL
ANION GAP: 7 (ref 5–15)
BUN: 26 mg/dL — ABNORMAL HIGH (ref 6–20)
CALCIUM: 9 mg/dL (ref 8.9–10.3)
CO2: 31 mmol/L (ref 22–32)
Chloride: 100 mmol/L — ABNORMAL LOW (ref 101–111)
Creatinine, Ser: 1.48 mg/dL — ABNORMAL HIGH (ref 0.44–1.00)
GFR calc non Af Amer: 31 mL/min — ABNORMAL LOW (ref >60)
GFR, EST AFRICAN AMERICAN: 35 mL/min — AB (ref >60)
Glucose, Bld: 219 mg/dL — ABNORMAL HIGH (ref 65–99)
POTASSIUM: 4.7 mmol/L (ref 3.5–5.1)
Sodium: 138 mmol/L (ref 135–145)

## 2015-11-14 LAB — BLOOD GAS, VENOUS
ACID-BASE EXCESS: 4.2 mmol/L — AB (ref 0.0–2.0)
Bicarbonate: 31.3 mmol/L — ABNORMAL HIGH (ref 20.0–28.0)
Delivery systems: POSITIVE
FIO2: 0.3
O2 SAT: 95.5 %
PH VEN: 7.34 (ref 7.250–7.430)
Patient temperature: 37
pCO2, Ven: 58 mmHg (ref 44.0–60.0)
pO2, Ven: 83 mmHg — ABNORMAL HIGH (ref 32.0–45.0)

## 2015-11-14 LAB — LACTIC ACID, PLASMA: LACTIC ACID, VENOUS: 1.1 mmol/L (ref 0.5–1.9)

## 2015-11-14 LAB — TROPONIN I: Troponin I: 0.18 ng/mL (ref <0.03)

## 2015-11-14 LAB — BRAIN NATRIURETIC PEPTIDE: B NATRIURETIC PEPTIDE 5: 121 pg/mL — AB (ref 0.0–100.0)

## 2015-11-14 MED ORDER — ASPIRIN 81 MG PO CHEW
324.0000 mg | CHEWABLE_TABLET | Freq: Once | ORAL | Status: DC
  Filled 2015-11-14: qty 4

## 2015-11-14 MED ORDER — ALBUTEROL SULFATE (2.5 MG/3ML) 0.083% IN NEBU
INHALATION_SOLUTION | RESPIRATORY_TRACT | Status: AC
  Administered 2015-11-14: 21:00:00
  Filled 2015-11-14: qty 6

## 2015-11-14 MED ORDER — SODIUM CHLORIDE 0.9 % IV SOLN: 250.0000 mL | INTRAVENOUS | Status: DC | PRN

## 2015-11-14 MED ORDER — IPRATROPIUM-ALBUTEROL 0.5-2.5 (3) MG/3ML IN SOLN: 3.0000 mL | Freq: Four times a day (QID) | RESPIRATORY_TRACT | Status: DC | PRN

## 2015-11-14 MED ORDER — LORAZEPAM 2 MG/ML IJ SOLN
1.0000 mg | Freq: Once | INTRAMUSCULAR | Status: AC
  Administered 2015-11-14: 1 mg via INTRAVENOUS
  Filled 2015-11-14: qty 1

## 2015-11-14 MED ORDER — HEPARIN SODIUM (PORCINE) 5000 UNIT/ML IJ SOLN
5000.0000 [IU] | Freq: Three times a day (TID) | INTRAMUSCULAR | Status: DC
  Administered 2015-11-15 – 2015-11-17 (×7): 5000 [IU] via SUBCUTANEOUS
  Filled 2015-11-14 (×7): qty 1

## 2015-11-14 MED ORDER — METHYLPREDNISOLONE SODIUM SUCC 125 MG IJ SOLR
125.0000 mg | Freq: Once | INTRAMUSCULAR | Status: AC
  Administered 2015-11-14: 125 mg via INTRAVENOUS
  Filled 2015-11-14: qty 2

## 2015-11-14 MED ORDER — ASPIRIN 81 MG PO CHEW: 324.0000 mg | CHEWABLE_TABLET | ORAL | Status: DC

## 2015-11-14 MED ORDER — IPRATROPIUM-ALBUTEROL 0.5-2.5 (3) MG/3ML IN SOLN
3.0000 mL | RESPIRATORY_TRACT | Status: DC
  Administered 2015-11-15 (×3): 3 mL via RESPIRATORY_TRACT
  Filled 2015-11-14 (×4): qty 3

## 2015-11-14 MED ORDER — ASPIRIN 300 MG RE SUPP: 300.0000 mg | RECTAL | Status: DC

## 2015-11-14 MED ORDER — ALBUTEROL (5 MG/ML) CONTINUOUS INHALATION SOLN
5.0000 mg/h | INHALATION_SOLUTION | RESPIRATORY_TRACT | Status: DC
  Administered 2015-11-14: 5 mg/h via RESPIRATORY_TRACT
  Filled 2015-11-14: qty 20

## 2015-11-14 MED ORDER — LORAZEPAM 2 MG/ML IJ SOLN
INTRAMUSCULAR | Status: AC
  Administered 2015-11-14: 1 mg via INTRAVENOUS
  Filled 2015-11-14: qty 1

## 2015-11-14 MED ORDER — DEXTROSE 5 % IV SOLN
500.0000 mg | Freq: Once | INTRAVENOUS | Status: AC
  Administered 2015-11-14: 500 mg via INTRAVENOUS
  Filled 2015-11-14: qty 500

## 2015-11-14 MED ORDER — DEXTROSE 5 % IV SOLN
1.0000 g | Freq: Once | INTRAVENOUS | Status: AC
  Administered 2015-11-14: 1 g via INTRAVENOUS
  Filled 2015-11-14: qty 10

## 2015-11-14 MED ORDER — METHYLPREDNISOLONE SODIUM SUCC 40 MG IJ SOLR
40.0000 mg | Freq: Two times a day (BID) | INTRAMUSCULAR | Status: DC
  Administered 2015-11-15 – 2015-11-17 (×4): 40 mg via INTRAVENOUS
  Filled 2015-11-14 (×4): qty 1

## 2015-11-14 NOTE — Patient Outreach (Signed)
[facility] Care Management  11/14/2015  [name] 02-18-1925 [id number]   Telephone Screen  Referral Date: 11/11/15 Referral Source: [facility] Tier 4 List Referral Reason: 5 ED visits, 4 Admissions    Outreach attempt # 1 to patient. Spoke with patient. She states that she is doing fairly well. She reports that she has her good and bad days. She is seeing her MDs regularly. Denies any issues with transportation or meds. She voices she continues to wear her oxygen. Patient states she continues to grieve the loss of her younger brother whom she was very close to. She is planning an upcoming trip to [place] to visit family/friends. She denies having any RN CM needs or concerns at this time. [facility] in the past. She is agreeable to RN CM mailing [facility] for future reference but declined services at this time.      Plan: RN CM will notify [facility] administrative assistant of case status. RN CM will send [facility] broch[facility] and info to patient. RN CM will notify PCP of case closure via letter.  Antionette Fairyoshanda Takhia Spoon, RN,BSN,CCM [facility] Telephonic Care Management Coordinator Direct Phone: [phone] Free: [phone] Fax: [phone]

## 2015-11-14 NOTE — ED Provider Notes (Signed)
[facility] Emergency Department Provider Note  ____________________________________________  Time seen: Approximately 9:32 PM  I have reviewed the triage vital signs and the nursing notes.   HISTORY  Chief Complaint Respiratory Distress    HPI Cynthia [name] is a 82 y.o. female history of COPD on exogenous 02, CAD, aortic aneurysm, presenting with respiratory distress. Some of the history is obtained from the patient, and some from EMS, as the patient is significantly short of breath upon arrival. It is reported that the patient called the paramedics at 5 PM for her shortness of breath that had started today. For the past several days she has had a cough productive of thin phlegm, which is new for her. She has had chest pain only when she coughs. She denies any fever or chills, nausea or vomiting. On arrival, the paramedics noted that the patient was in extremis, and gave her a DuoNeb. The patient attempted to refuse transport, but EMS was adamant that she be evaluated in the emergency department.  Past Medical History:  Diagnosis Date  . Abdominal pain, epigastric   . Allergic drug reaction   . Aneurysm of aorta (HCC)   . Asthma   . Breathlessness on exertion   . CAD (coronary artery disease)   . COPD (chronic obstructive pulmonary disease) (HCC)   . GERD (gastroesophageal reflux disease)   . History of partial colectomy   . Hypercholesteremia   . Hypertension   . Hypothyroidism   . Hypoxia   . Insomnia   . Nocturia   . Olecranon bursitis   . Osteoarthritis   . Osteoporosis   . Pneumonia   . Renal failure   . Seborrheic keratoses   . Spinal stenosis of lumbar region without neurogenic claudication   . Stomatitis monilial   . Stroke (HCC)   . UTI symptoms   . Vaginal atrophy     Patient Active Problem List   Diagnosis Date Noted  . COPD exacerbation (HCC) 07/03/2015  . Ischemic leg 04/20/2015  . Cellulitis 02/08/2015  . COPD with acute  exacerbation (HCC) 02/06/2015  . Atherosclerosis of coronary artery 11/11/2014  . Centriacinar emphysema (HCC) 11/11/2014  . CAFL (chronic airflow limitation) (HCC) 11/11/2014  . Polypharmacy 11/11/2014  . H/O hemicolectomy 11/11/2014  . Adult hypothyroidism 11/11/2014  . Hypoxia 11/11/2014  . Bursitis of elbow 11/11/2014  . Degenerative arthritis of hip 11/11/2014  . Basal cell papilloma 11/11/2014  . H/O respiratory system disease 07/29/2014  . Inflammation of sacroiliac joint (HCC) 09/29/2013  . AA (aortic aneurysm) (HCC) 07/19/2009  . Arthritis of hand, degenerative 11/12/2008  . Acid reflux 11/27/2007  . Lumbar canal stenosis 09/16/2007  . Hypercholesteremia 01/31/2007  . BP (high blood pressure) 11/20/2005  . OP (osteoporosis) 11/20/2005    Past Surgical History:  Procedure Laterality Date  . APPENDECTOMY    . APPLICATION OF WOUND VAC Right 05/26/2015   Procedure: APPLICATION OF WOUND VAC;  Surgeon: Annice Needy, MD;  Location: [facility] ORS;  Service: Vascular;  Laterality: Right;  . BACK SURGERY    . COLON SURGERY     Hole in colon that was repaired  . EYE SURGERY Bilateral    Cataract extraction with IOL  . Multilevel spinal fusion and laminectomy  2010   Due to spinal stenosis Dr. Gerrit Heck  . PERIPHERAL VASCULAR CATHETERIZATION N/A 03/16/2015   Procedure: Abdominal Aortogram w/Lower Extremity;  Surgeon: Renford Dills, MD;  Location: [facility] INVASIVE CV LAB;  Service: Cardiovascular;  Laterality:  N/A;  . PERIPHERAL VASCULAR CATHETERIZATION  03/16/2015   Procedure: Lower Extremity Intervention;  Surgeon: Renford Dills, MD;  Location: [facility] INVASIVE CV LAB;  Service: Cardiovascular;;  . PERIPHERAL VASCULAR CATHETERIZATION Bilateral 04/20/2015   Procedure: Endovascular Repair/Stent Graft;  Surgeon: Renford Dills, MD;  Location: [facility] INVASIVE CV LAB;  Service: Cardiovascular;  Laterality: Bilateral;  . Rotator cuff surgery  2007  . VAGINAL HYSTERECTOMY  1960  . WOUND  DEBRIDEMENT Right 05/26/2015   Procedure: DEBRIDEMENT WOUND ( RIGHT GROIN DEBRIDEMENT );  Surgeon: Annice Needy, MD;  Location: [facility] ORS;  Service: Vascular;  Laterality: Right;  . WRIST SURGERY     lump removed due to pain    Current Outpatient Rx  . Order #: [id number] Class: OTC  . Order #: [id number] Class: Normal  . Order #: [id number] Class: Historical Med  . Order #: [id number] Class: Historical Med  . Order #: [id number] Class: Normal  . Order #: [id number] Class: Historical Med  . Order #: [id number] Class: Historical Med  . Order #: [id number] Class: No Print  . Order #: [id number] Class: Historical Med  . Order #: [id number] Class: Historical Med  . Order #: [id number] Class: Historical Med  . Order #: [id number] Class: Historical Med  . Order #: [id number] Class: Historical Med  . Order #: [id number] Class: Historical Med  . Order #: [id number] Class: Normal  . Order #: [id number] Class: Normal  . Order #: [id number] Class: Normal  . Order #: [id number] Class: Historical Med  . Order #: [id number] Class: Historical Med  . Order #: [id number] Class: Historical Med  . Order #: [id number] Class: Historical Med  . Order #: [id number] Class: Normal  . Order #: [id number] Class: Historical Med    Allergies Nitrofurantoin monohyd macro; Levaquin [levofloxacin]; Adhesive [tape]; Codeine; Crestor [rosuvastatin]; Duratuss g [guaifenesin]; Oxycodone-acetaminophen; Penicillins; Propoxyphene; Tramadol; and Morphine  Family History  Problem Relation Age of Onset  . COPD Sister   . Diabetes Sister   . Heart disease Brother   . Cancer Brother   . Hypertension Brother   . Diabetes Brother     Social History Social History  Substance Use Topics  . Smoking status: Former Smoker    Packs/day: 0.50    Types: Cigarettes  . Smokeless tobacco: Never Used     Comment: quit 11 years ago  . Alcohol use No     Comment: 2 oz of wine at night    Review of Systems Constitutional: No fever/chills.As of respiratory  distress. Eyes: No visual changes. ENT: No sore throat. No congestion or rhinorrhea. Cardiovascular: Denies chest pain. Denies palpitations. Respiratory: As shortness of breath.  Positive cough. Gastrointestinal: No abdominal pain.  No nausea, no vomiting.  No diarrhea.  No constipation. Genitourinary: Negative for dysuria. Musculoskeletal: Negative for back pain. Skin: Negative for rash. Neurological: Negative for headaches. No focal numbness, tingling or weakness.   10-point ROS otherwise negative.  ____________________________________________   PHYSICAL EXAM:  VITAL SIGNS: ED Triage Vitals  Enc Vitals Group     BP 11/14/15 2108 119/78     Pulse Rate 11/14/15 2108 (!) 114     Resp 11/14/15 2108 (!) 38     Temp --      Temp Source 11/14/15 2108 Oral     SpO2 11/14/15 2108 97 %     Weight 11/14/15 2115 135 lb (61.2 kg)     Height 11/14/15 2115 5' (1.524 m)     Head Circumference --      Peak Flow --      Pain Score --  Pain Loc --      Pain Edu? --      Excl. in GC? --     Constitutional: Patient is chronically ill-appearing and in no acute distress. She is alert and able to answer basic questions. Eyes: Conjunctivae are normal.  EOMI. No scleral icterus. Head: Atraumatic. Nose: No congestion/rhinnorhea. Mouth/Throat: Mucous membranes are highly dry.  Neck: No stridor.  Supple.  Positive JVD. No meningismus. Cardiovascular: Fast rate, regular rhythm. No murmurs, rubs or gallops.  Respiratory: Positive respiratory distress with significant tachypnea, accessory muscle use and retractions. The patient also has some cyanosis periorally. She has mild wheezing expiratory greater than inspiratory, and very poor air exchange. Gastrointestinal: Soft, nontender and nondistended.  No guarding or rebound.  No peritoneal signs. Musculoskeletal: No LE edema. No ttp in the calves or palpable cords.  Negative Homan's sign. Neurologic:  A&Ox3.  Speech is clear.  Face and smile are  symmetric.  EOMI.  Moves all extremities well. Skin:  Skin is warm, dry and intact. No rash noted. Psychiatric: Mood and affect are anxious.  ____________________________________________   LABS (all labs ordered are listed, but only abnormal results are displayed)  Labs Reviewed  BASIC METABOLIC PANEL - Abnormal; Notable for the following:       Result Value   Chloride 100 (*)    Glucose, Bld 219 (*)    BUN 26 (*)    Creatinine, Ser 1.48 (*)    GFR calc non Af Amer 31 (*)    GFR calc Af Amer 35 (*)    All other components within normal limits  TROPONIN I - Abnormal; Notable for the following:    Troponin I 0.18 (*)    All other components within normal limits  BRAIN NATRIURETIC PEPTIDE - Abnormal; Notable for the following:    B Natriuretic Peptide 121.0 (*)    All other components within normal limits  BLOOD GAS, VENOUS - Abnormal; Notable for the following:    pO2, Ven 83.0 (*)    Bicarbonate 31.3 (*)    Acid-Base Excess 4.2 (*)    All other components within normal limits  CULTURE, BLOOD (ROUTINE X 2)  CULTURE, BLOOD (ROUTINE X 2)  URINE CULTURE  CBC  LACTIC ACID, PLASMA  LACTIC ACID, PLASMA  URINALYSIS COMPLETEWITH MICROSCOPIC ([facility] ONLY)   ____________________________________________  EKG  ED ECG REPORT I, Rockne Menghini, the attending physician, personally viewed and interpreted this ECG.   Date: 11/14/2015  EKG Time: 2331  Rate: 90  Rhythm: normal sinus rhythm  Axis: normal  Intervals:: QTC prolonged  ST&T Change: Nonspecific T-wave inversions in V1, V2. No ST elevation. Positive PVC but  ____________________________________________  RADIOLOGY  Dg Chest Portable 1 View  Result Date: 11/14/2015 CLINICAL DATA:  Shortness of breath. Difficulty breathing for 4 hours. Decreased oxygenation. History of COPD on home oxygen. Wheezing. EXAM: PORTABLE CHEST 1 VIEW COMPARISON:  07/03/2015 FINDINGS: Emphysematous changes and scattered fibrosis in the  lungs. No focal airspace disease or consolidation. Normal heart size and pulmonary vascularity. No pleural effusions or pneumothorax. No significant change since prior study. IMPRESSION: Emphysematous changes and fibrosis in the lungs. No evidence of active pulmonary disease. Electronically Signed   By: Burman Nieves M.D.   On: 11/14/2015 22:26    ____________________________________________   PROCEDURES  Procedure(s) performed: None  Procedures  Critical Care performed: Yes, see critical care note(s) ____________________________________________   INITIAL IMPRESSION / ASSESSMENT AND PLAN / ED COURSE  Pertinent labs & imaging results that  were available during my care of the patient were reviewed by me and considered in my medical decision making (see chart for details).  82 y.o. female with a history of COPD presenting with several hours of progressively worsening shortness of breath and arriving to the emergency department in respiratory distress. I am concerned about COPD exacerbation. The patient does have a history of CAD but no known history of CHF. She does not appear to have any fluid overload on my examination. Consider ACS or MI. I do hear breath sounds bilaterally but will get a chest x-ray to rule out pneumothorax. PE is also possible although I do not see any evidence of DVT and the patient is actually oxygenating fairly. Aortic pathology is much less likely.  ----------------------------------------- 9:36 PM on 11/14/2015 ----------------------------------------- On arrival to the emergency department, the patient is anxious and slightly agitated with a respiratory rate greater than 30 but maintaining oxygen saturations of greater than 96% on BiPAP. She has not yet complied with a temperature measurement. She initially was unwilling to tolerate the BiPAP, but was treated with a small dose of Ativan in order to help her keep this mask on. I did talk to her about a living Will  or her DO NOT RESUSCITATE and DO NOT INTUBATE status, and she did not want answer my questions.  CRITICAL CARE Performed by: Rockne MenghiniNorman, Anne-Caroline   Total critical care time: 60 minutes  Critical care time was exclusive of separately billable procedures and treating other patients.  Critical care was necessary to treat or prevent imminent or life-threatening deterioration.  Critical care was time spent personally by me on the following activities: development of treatment plan with patient and/or surrogate as well as nursing, discussions with consultants, evaluation of patient's response to treatment, examination of patient, obtaining history from patient or surrogate, ordering and performing treatments and interventions, ordering and review of laboratory studies, ordering and review of radiographic studies, pulse oximetry and re-evaluation of patient's condition.  ----------------------------------------- 11:16 PM on 11/14/2015 -----------------------------------------  The patient became claustrophobic and ripped her BiPAP mask off. I had to go back in the room and she is back on her nasal cannula, with increasing work of breathing, tachypnea, accessory muscle use and retractions. The patient initially refused admission, but I have discussed this with her and her with her daughter and she now agrees to stay "but only for one night." I have also convinced her to attempt BiPAP once more with Ativan, to see if she is able to tolerate it better.  The patient has a troponin of 0.18 today and I cannot say for sure whether this is related to coronary artery disease or extreme strain from her respiratory distress. In the meantime, I will treat her with aspirin. I've asked her once more about chest pain and she says that she occasionally gets a right-sided chest pain that is typical of her COPD but she has not had any new or different chest pain in the last couple of days. At this point, heparinization  is not indicated.  I've spoken with the admitting hospitalist, who recommends discussion with the intensivist even if the patient does not tolerate her BiPAP. They have been paged.  ____________________________________________  FINAL CLINICAL IMPRESSION(S) / ED DIAGNOSES  Final diagnoses:  Respiratory distress  Acute renal insufficiency  COPD exacerbation (HCC)  NSTEMI (non-ST elevated myocardial infarction) [facility](HCC)    Clinical Course      NEW MEDICATIONS STARTED DURING THIS VISIT:  New Prescriptions  No medications on file      Rockne Menghini, MD 11/15/15 0006

## 2015-11-14 NOTE — ED Notes (Signed)
Pt on c-pap at this time, RT and MD at bedside

## 2015-11-14 NOTE — ED Triage Notes (Addendum)
Pt presents to ED from home by EMS with c/o difficulty breathing for the past 4 hours. 4-5 breathing tx at home with no relief. 150/82 HR 120 84% on Room Air and 94% during treatment while with EMS.  Hx of COPD and uses 2L of O2 at home. Denies chest pain unless coughing. New productive cough; denies fever. Pt very anxious. Audible wheezing present.

## 2015-11-15 ENCOUNTER — Inpatient Hospital Stay: Payer: Commercial Managed Care - HMO

## 2015-11-15 ENCOUNTER — Encounter: Payer: Self-pay | Admitting: Critical Care Medicine

## 2015-11-15 DIAGNOSIS — J9601 Acute respiratory failure with hypoxia: Secondary | ICD-10-CM

## 2015-11-15 LAB — CBC
HEMATOCRIT: 36.5 % (ref 35.0–47.0)
HEMOGLOBIN: 12 g/dL (ref 12.0–16.0)
MCH: 31.7 pg (ref 26.0–34.0)
MCHC: 32.9 g/dL (ref 32.0–36.0)
MCV: 96.3 fL (ref 80.0–100.0)
Platelets: 205 10*3/uL (ref 150–440)
RBC: 3.79 MIL/uL — ABNORMAL LOW (ref 3.80–5.20)
RDW: 13.7 % (ref 11.5–14.5)
WBC: 7.1 10*3/uL (ref 3.6–11.0)

## 2015-11-15 LAB — BASIC METABOLIC PANEL
Anion gap: 8 (ref 5–15)
BUN: 27 mg/dL — AB (ref 6–20)
CALCIUM: 9 mg/dL (ref 8.9–10.3)
CHLORIDE: 99 mmol/L — AB (ref 101–111)
CO2: 31 mmol/L (ref 22–32)
CREATININE: 1.3 mg/dL — AB (ref 0.44–1.00)
GFR calc Af Amer: 42 mL/min — ABNORMAL LOW (ref >60)
GFR calc non Af Amer: 36 mL/min — ABNORMAL LOW (ref >60)
GLUCOSE: 163 mg/dL — AB (ref 65–99)
Potassium: 4 mmol/L (ref 3.5–5.1)
Sodium: 138 mmol/L (ref 135–145)

## 2015-11-15 LAB — PROCALCITONIN

## 2015-11-15 LAB — URINALYSIS COMPLETE WITH MICROSCOPIC (ARMC ONLY)
BACTERIA UA: NONE SEEN
BILIRUBIN URINE: NEGATIVE
Glucose, UA: NEGATIVE mg/dL
HGB URINE DIPSTICK: NEGATIVE
Ketones, ur: NEGATIVE mg/dL
LEUKOCYTES UA: NEGATIVE
Nitrite: NEGATIVE
PH: 6 (ref 5.0–8.0)
Protein, ur: NEGATIVE mg/dL
RBC / HPF: NONE SEEN RBC/hpf (ref 0–5)
Specific Gravity, Urine: 1.004 — ABNORMAL LOW (ref 1.005–1.030)
WBC UA: NONE SEEN WBC/hpf (ref 0–5)

## 2015-11-15 LAB — BLOOD GAS, ARTERIAL
Acid-Base Excess: 4.4 mmol/L — ABNORMAL HIGH (ref 0.0–2.0)
Bicarbonate: 30.8 mmol/L — ABNORMAL HIGH (ref 20.0–28.0)
DELIVERY SYSTEMS: POSITIVE
EXPIRATORY PAP: 7
FIO2: 0.3
INSPIRATORY PAP: 14
MECHANICAL RATE: 12
O2 SAT: 97.6 %
PO2 ART: 99 mmHg (ref 83.0–108.0)
Patient temperature: 37
pCO2 arterial: 52 mmHg — ABNORMAL HIGH (ref 32.0–48.0)
pH, Arterial: 7.38 (ref 7.350–7.450)

## 2015-11-15 LAB — EXPECTORATED SPUTUM ASSESSMENT W GRAM STAIN, RFLX TO RESP C

## 2015-11-15 LAB — PHOSPHORUS: PHOSPHORUS: 4.6 mg/dL (ref 2.5–4.6)

## 2015-11-15 LAB — GLUCOSE, CAPILLARY
GLUCOSE-CAPILLARY: 137 mg/dL — AB (ref 65–99)
GLUCOSE-CAPILLARY: 139 mg/dL — AB (ref 65–99)
GLUCOSE-CAPILLARY: 184 mg/dL — AB (ref 65–99)
GLUCOSE-CAPILLARY: 196 mg/dL — AB (ref 65–99)
Glucose-Capillary: 164 mg/dL — ABNORMAL HIGH (ref 65–99)
Glucose-Capillary: 182 mg/dL — ABNORMAL HIGH (ref 65–99)
Glucose-Capillary: 151 mg/dL — ABNORMAL HIGH (ref 65–99)

## 2015-11-15 LAB — MAGNESIUM: MAGNESIUM: 2.1 mg/dL (ref 1.7–2.4)

## 2015-11-15 LAB — MRSA PCR SCREENING: MRSA by PCR: NEGATIVE

## 2015-11-15 LAB — EXPECTORATED SPUTUM ASSESSMENT W REFEX TO RESP CULTURE

## 2015-11-15 MED ORDER — IPRATROPIUM-ALBUTEROL 0.5-2.5 (3) MG/3ML IN SOLN
3.0000 mL | Freq: Four times a day (QID) | RESPIRATORY_TRACT | Status: DC
  Administered 2015-11-15 – 2015-11-17 (×8): 3 mL via RESPIRATORY_TRACT
  Filled 2015-11-15 (×8): qty 3

## 2015-11-15 MED ORDER — AZITHROMYCIN 500 MG IV SOLR
500.0000 mg | INTRAVENOUS | Status: DC
  Filled 2015-11-15: qty 500

## 2015-11-15 MED ORDER — DEXTROSE 5 % IV SOLN
1.0000 g | INTRAVENOUS | Status: DC
  Administered 2015-11-15: 21:00:00 1 g via INTRAVENOUS
  Filled 2015-11-15 (×3): qty 10

## 2015-11-15 MED ORDER — FUROSEMIDE 20 MG PO TABS
20.0000 mg | ORAL_TABLET | Freq: Every day | ORAL | Status: DC
  Administered 2015-11-15 – 2015-11-17 (×2): 20 mg via ORAL
  Filled 2015-11-15 (×3): qty 1

## 2015-11-15 MED ORDER — CHLORHEXIDINE GLUCONATE 0.12 % MT SOLN
15.0000 mL | Freq: Two times a day (BID) | OROMUCOSAL | Status: DC
  Administered 2015-11-15 – 2015-11-17 (×6): 15 mL via OROMUCOSAL
  Filled 2015-11-15 (×5): qty 15

## 2015-11-15 MED ORDER — ASPIRIN EC 81 MG PO TBEC
81.0000 mg | DELAYED_RELEASE_TABLET | Freq: Every day | ORAL | Status: DC
  Filled 2015-11-15: qty 1

## 2015-11-15 MED ORDER — CLOPIDOGREL BISULFATE 75 MG PO TABS
75.0000 mg | ORAL_TABLET | Freq: Every day | ORAL | Status: DC
  Administered 2015-11-15 – 2015-11-17 (×3): 75 mg via ORAL
  Filled 2015-11-15 (×3): qty 1

## 2015-11-15 MED ORDER — PANTOPRAZOLE SODIUM 40 MG PO TBEC
40.0000 mg | DELAYED_RELEASE_TABLET | Freq: Every day | ORAL | Status: DC
  Administered 2015-11-15 – 2015-11-17 (×3): 40 mg via ORAL
  Filled 2015-11-15 (×3): qty 1

## 2015-11-15 MED ORDER — ORAL CARE MOUTH RINSE
15.0000 mL | Freq: Two times a day (BID) | OROMUCOSAL | Status: DC
  Administered 2015-11-15 – 2015-11-16 (×3): 15 mL via OROMUCOSAL

## 2015-11-15 MED ORDER — ASPIRIN EC 81 MG PO TBEC: 81.0000 mg | DELAYED_RELEASE_TABLET | Freq: Every day | ORAL | Status: DC

## 2015-11-15 MED ORDER — AZITHROMYCIN 250 MG PO TABS: 500.0000 mg | ORAL_TABLET | ORAL | Status: DC

## 2015-11-15 MED ORDER — AZITHROMYCIN 500 MG PO TABS
500.0000 mg | ORAL_TABLET | ORAL | Status: DC
  Administered 2015-11-15: 500 mg via ORAL
  Filled 2015-11-15: qty 2

## 2015-11-15 MED ORDER — LEVOTHYROXINE SODIUM 75 MCG PO TABS
75.0000 ug | ORAL_TABLET | Freq: Every day | ORAL | Status: DC
  Administered 2015-11-15 – 2015-11-17 (×3): 75 ug via ORAL
  Filled 2015-11-15 (×4): qty 1

## 2015-11-15 MED ORDER — DEXTROSE 5 % IV SOLN
1.0000 g | INTRAVENOUS | Status: DC
  Filled 2015-11-15: qty 10

## 2015-11-15 MED ORDER — GUAIFENESIN-CODEINE 100-10 MG/5ML PO SOLN
5.0000 mL | ORAL | Status: DC | PRN
  Administered 2015-11-15 (×2): 5 mL via ORAL
  Filled 2015-11-15 (×2): qty 5

## 2015-11-15 MED ORDER — INSULIN ASPART 100 UNIT/ML ~~LOC~~ SOLN
0.0000 [IU] | SUBCUTANEOUS | Status: DC
  Administered 2015-11-15 (×2): 2 [IU] via SUBCUTANEOUS
  Administered 2015-11-15: 1 [IU] via SUBCUTANEOUS
  Administered 2015-11-15 (×2): 2 [IU] via SUBCUTANEOUS
  Administered 2015-11-16 (×2): 1 [IU] via SUBCUTANEOUS
  Administered 2015-11-16: 20:00:00 3 [IU] via SUBCUTANEOUS
  Administered 2015-11-17 (×2): 2 [IU] via SUBCUTANEOUS
  Administered 2015-11-17: 1 [IU] via SUBCUTANEOUS
  Filled 2015-11-15 (×2): qty 1
  Filled 2015-11-15: qty 3
  Filled 2015-11-15: qty 2
  Filled 2015-11-15: qty 1
  Filled 2015-11-15: qty 2
  Filled 2015-11-15: qty 1
  Filled 2015-11-15 (×4): qty 2

## 2015-11-15 NOTE — Plan of Care (Signed)
Problem: Education: Goal: Knowledge of [facility] Education information/materials will improve Outcome: Progressing BP 113/60   Pulse 77   Temp 98 F (36.7 C) (Oral)   Resp 17   Ht 5\' 5"  (1.651 m)   Wt 62.3 kg (137 lb 5.6 oz)   SpO2 98%   BMI 22.86 kg/m   POC reviewed with patient, cont on  Encouraging self reposition, vital signs closely monitored and any concerns addressed at this time. Will continue to monitor.    Intake/Output Summary (Last 24 hours) at 11/15/15 0913 Last data filed at 11/15/15 0900  Gross per 24 hour  Intake              240 ml  Output              300 ml  Net              -60 ml    Generalized bruises all over the body except back due to frequent falls Cont on oxygen 2 l/min Nasal Canula

## 2015-11-15 NOTE — Progress Notes (Signed)
Pharmacy consult note Antibiotic renal adjustment:  Patient is not currently on any antibiotics that require renal dosing. Pharmacy will follow and manage as appropriate. 

## 2015-11-15 NOTE — H&P (Signed)
PULMONARY / CRITICAL CARE MEDICINE   Name: Cynthia PughDaisy W [name] MRN: [id number] DOB: 1925/10/31    ADMISSION DATE:  11/14/2015 CONSULTATION DATE:  11/14/2015  REFERRING MD:  Dr. Sharma [name]  CHIEF COMPLAINT: Respiratory Distress  HISTORY OF PRESENT ILLNESS:   This is a 82 yo female with a PMH of Stroke, Pneumonia, Respiratory Failure, Renal Failure, Osteoporosis, Osteoarthritis, Olecranon bursitis, Insomnia, Hypoxia, Hypothyroidism, Hypercholesteremia, Partial Colectomy, GERD, COPD, CAD, Asthma, Aortic Aneurysm, Seborrheic Keratosis, and Spinal Stenosis of lumbar region without neurogenic claudication.  She presented to [facility] ER on 10/2 with c/o shortness of breath onset 10/2. According to pts. niece the pt called her to inform her she needed help, therefore EMS was notified.  Upon EMS arrival the pt was given a breathing treatment, however she refused to go to the hospital at that time 30 minutes later EMS was notified again because the pt stated she "felt like she was going to die."  For the past several days she has had a productive frequent cough with thin clear sputum production.  She also endorses chest pain when cough.  PCCM consulted due to acute on chronic respiratory failure secondary to CAP and AECOPD requiring bipap.    PAST MEDICAL HISTORY :  She  has a past medical history of Abdominal pain, epigastric; Allergic drug reaction; Aneurysm of aorta (HCC); Asthma; Breathlessness on exertion; CAD (coronary artery disease); COPD (chronic obstructive pulmonary disease) (HCC); GERD (gastroesophageal reflux disease); History of partial colectomy; Hypercholesteremia; Hypertension; Hypothyroidism; Hypoxia; Insomnia; Nocturia; Olecranon bursitis; Osteoarthritis; Osteoporosis; Pneumonia; Renal failure; Seborrheic keratoses; Spinal stenosis of lumbar region without neurogenic claudication; Stomatitis monilial; Stroke [facility](HCC); UTI symptoms; and Vaginal atrophy.  PAST SURGICAL HISTORY: She  has a past surgical  history that includes Multilevel spinal fusion and laminectomy (2010); Rotator cuff surgery (2007); Vaginal hysterectomy (1960); Wrist surgery; Colon surgery; Appendectomy; Cardiac catheterization (N/A, 03/16/2015); Cardiac catheterization (03/16/2015); Cardiac catheterization (Bilateral, 04/20/2015); Back surgery; Eye surgery (Bilateral); Wound debridement (Right, 05/26/2015); and Application if wound vac (Right, 05/26/2015).  Allergies  Allergen Reactions  . Nitrofurantoin Monohyd Macro Shortness Of Breath  . Levaquin [Levofloxacin] Rash  . Adhesive [Tape] Other (See Comments)    "pull skin off"  . Codeine Itching  . Crestor [Rosuvastatin] Other (See Comments)    Reaction:  Dizziness   . Duratuss G [Guaifenesin] Nausea And Vomiting  . Oxycodone-Acetaminophen Itching  . Penicillins Other (See Comments)    Unsure   . Propoxyphene Itching  . Tramadol Itching  . Morphine Itching and Rash    No current facility-administered medications on file prior to encounter.    Current Outpatient Prescriptions on File Prior to Encounter  Medication Sig  . acetaminophen (TYLENOL) 325 MG tablet Take 1-2 tablets (325-650 mg total) by mouth every 4 (four) hours as needed for mild pain (or temp >/= 101 F).  Marland Kitchen. albuterol (PROVENTIL HFA;VENTOLIN HFA) 108 (90 Base) MCG/ACT inhaler Inhale 2 puffs into the lungs every 6 (six) hours as needed for wheezing or shortness of breath.  Marland Kitchen. albuterol (PROVENTIL) (2.5 MG/3ML) 0.083% nebulizer solution Take 2.5 mg by nebulization every 6 (six) hours as needed for wheezing or shortness of breath. Pt mixes with her Atrovent nebulizer solution.  Marland Kitchen. aspirin EC 81 MG tablet Take 81 mg by mouth daily.   . benzonatate (TESSALON) 100 MG capsule Take 2 capsules (200 mg total) by mouth 3 (three) times daily as needed for cough.  . carvedilol (COREG) 3.125 MG tablet Take 3.125 mg by mouth 2 (two) times daily with  a meal.  . cholecalciferol (VITAMIN D) 1000 UNITS tablet Take 1,000 Units by  mouth daily. Reported on 06/14/2015  . clopidogrel (PLAVIX) 75 MG tablet Take 1 tablet (75 mg total) by mouth daily.  Marland Kitchen dextromethorphan-guaiFENesin (MUCINEX DM) 30-600 MG 12hr tablet Take 1 tablet by mouth 2 (two) times daily as needed.   . diclofenac sodium (VOLTAREN) 1 % GEL Apply 2 g topically 4 (four) times daily as needed (for pain). Reported on 07/15/2015  . ferrous sulfate 325 (65 FE) MG tablet Take 325 mg by mouth daily.   . fexofenadine (ALLEGRA) 180 MG tablet Take 180 mg by mouth daily.  . fluticasone furoate-vilanterol (BREO ELLIPTA) 200-25 MCG/INH AEPB Inhale 1 puff into the lungs daily.  . furosemide (LASIX) 20 MG tablet Take 20 mg by mouth daily.  Marland Kitchen ipratropium (ATROVENT) 0.02 % nebulizer solution Take 2.5 mLs (0.5 mg total) by nebulization every 6 (six) hours as needed for wheezing or shortness of breath. Pt mixes with her albuterol nebulizer solution.  . lansoprazole (PREVACID) 30 MG capsule Take 1 capsule (30 mg total) by mouth daily.  Marland Kitchen levothyroxine (SYNTHROID, LEVOTHROID) 75 MCG tablet TAKE 1 TABLET EVERY DAY  . Magnesium Oxide 250 MG TABS Take by mouth 2 (two) times daily.  . nitroGLYCERIN (NITROSTAT) 0.4 MG SL tablet Place 0.4 mg under the tongue every 5 (five) minutes as needed for chest pain. Reported on 06/14/2015  . Omega-3 Fatty Acids (FISH OIL) 1000 MG CAPS Take 1,000 mg by mouth daily. Reported on 07/15/2015  . Potassium Gluconate 595 MG CAPS Take 1 capsule by mouth every morning. Reported on 07/15/2015  . predniSONE (DELTASONE) 10 MG tablet Take 1 tablet (10 mg total) by mouth daily with breakfast. RESTART THIS AFTER FINISHING OFF THE PREDNISONE TAPER IN 12 DAYS  . vitamin C (ASCORBIC ACID) 500 MG tablet Take 500 mg by mouth daily. Reported on 09/02/2015    FAMILY HISTORY:  Her indicated that her mother is deceased. She indicated that her father is deceased. She indicated that her brother is deceased.    SOCIAL HISTORY: She  reports that she has quit smoking. Her smoking  use included Cigarettes. She smoked 0.50 packs per day. She has never used smokeless tobacco. She reports that she does not drink alcohol or use drugs.  REVIEW OF SYSTEMS:   Unable to assess pt on Bipap  SUBJECTIVE:  Pt currently on 30% Bipap no complaints at this time.  VITAL SIGNS: BP 115/73   Pulse 81   Resp 19   Ht 5' (1.524 m)   Wt 61.2 kg (135 lb)   SpO2 98%   BMI 26.37 kg/m   HEMODYNAMICS:    VENTILATOR SETTINGS:    INTAKE / OUTPUT: No intake/output data recorded.  PHYSICAL EXAMINATION: General:  Chronically ill appearing Caucasian female Neuro:  Lethargic, follows commands, PERRL HEENT:  Supple, no JVD Cardiovascular:  S1S2, RRR, no M/R/G Lungs: expiratory wheezes bilateral upper lobes, diminished all other lobes, even, non labored Abdomen:  +BS x4, obese soft, non tender, non distended Musculoskeletal:  Normal bulk and tone Skin:  Intact no rashes or lesions  LABS:  BMET  Recent Labs Lab 11/14/15 2124  NA 138  K 4.7  CL 100*  CO2 31  BUN 26*  CREATININE 1.48*  GLUCOSE 219*    Electrolytes  Recent Labs Lab 11/14/15 2124  CALCIUM 9.0    CBC  Recent Labs Lab 11/14/15 2124  WBC 9.8  HGB 12.5  HCT 37.2  PLT  246    Coag's No results for input(s): APTT, INR in the last 168 hours.  Sepsis Markers  Recent Labs Lab 11/14/15 2156  LATICACIDVEN 1.1    ABG No results for input(s): PHART, PCO2ART, PO2ART in the last 168 hours.  Liver Enzymes No results for input(s): AST, ALT, ALKPHOS, BILITOT, ALBUMIN in the last 168 hours.  Cardiac Enzymes  Recent Labs Lab 11/14/15 2124  TROPONINI 0.18*    Glucose No results for input(s): GLUCAP in the last 168 hours.  Imaging Dg Chest Portable 1 View  Result Date: 11/14/2015 CLINICAL DATA:  Shortness of breath. Difficulty breathing for 4 hours. Decreased oxygenation. History of COPD on home oxygen. Wheezing. EXAM: PORTABLE CHEST 1 VIEW COMPARISON:  07/03/2015 FINDINGS: Emphysematous  changes and scattered fibrosis in the lungs. No focal airspace disease or consolidation. Normal heart size and pulmonary vascularity. No pleural effusions or pneumothorax. No significant change since prior study. IMPRESSION: Emphysematous changes and fibrosis in the lungs. No evidence of active pulmonary disease. Electronically Signed   By: Burman Nieves M.D.   On: 11/14/2015 22:26   STUDIES:  None  CULTURES: Blood x2 10/2>> Urine 10/2>>  ANTIBIOTICS: Azithromycin 10/2>> Ceftriaxone 10/2>>  SIGNIFICANT EVENTS: 10/2-Pt admitted to [facility] ICU due to acute on chronic hypoxic respiratory failure secondary to CAP and AECOPD requiring Bipap PCCM consulted for management.  LINES/TUBES: PIV's x2 10/2>>  ASSESSMENT / PLAN:  PULMONARY A: Acute on chronic respiratory failure secondary to CAP and AECOPD Hx: Pneumonia and Asthma P:   Bipap for now will wean as tolerated Supplemental O2 prn  Maintain O2 sats 88% to 92% Continue bronchodilators Continue iv steroids  CXR in am Prn ABG's  CARDIOVASCULAR A:  Hx: Aortic aneurysm, CAD, Hypercholesteremia, Stroke, and HTN P:  Continue outpatient aspirin and plavix Hold outpatient coreg and lasix Telemetry monitoring  RENAL A:   Hx: Renal failure  P:   Trend BMP's Monitor uop Replace electrolytes as indicated  GASTROINTESTINAL A:   Hx: GERD, Epigastric Abdominal Pain, and Partial Colectomy P:   Protonix for GERD Keep NPO for now until off Bipap  HEMATOLOGIC A:   No acute issues P:  Trend CBC's Subq heparin for VTE prophylaxis Monitor for s/sx of bleeding Transfuse for hgb <7  INFECTIOUS A:   CAP  P:   Trend WBC's and monitor fever curve Trend PCT's and lactic acids Continue abx as listed above Follow cultures  ENDOCRINE A:   Hyperglycemia Hx: Hypothyroidism  P:   Continue outpatient synthroid SSI  NEUROLOGIC A:   Agitation P:   Avoid sedating medication  Frequent reorientation Lights on during the  day  MUSCULOSKELETAL A:  Hx: Spinal stenosis of lumbar region without neurogenic claudication, Osteoporosis, Osteoarthritis, and Olecranon Bursitis P: Prn Tylenol for pain  FAMILY  - Updates: Pts niece at bedside updated about plan of care and questions answered 11/14/15  - Inter-disciplinary family meet or Palliative Care meeting due by:  11/21/15  Sonda Rumble, Juel Burrow  Pulmonary/Critical Care Pager [phone] (please enter 7 digits) PCCM Consult Pager [phone] (please enter 7 digits)

## 2015-11-15 NOTE — Plan of Care (Signed)
Report given to Deidra RN

## 2015-11-15 NOTE — Progress Notes (Addendum)
PULMONARY / CRITICAL CARE MEDICINE   Name: Cynthia [name] MRN: [id number] DOB: August 20, 1925    ADMISSION DATE:  11/14/2015 CONSULTATION DATE:  11/14/2015  REFERRING MD:  Dr. Sharma [name]  CHIEF COMPLAINT: Respiratory Distress  HISTORY OF PRESENT ILLNESS:   resp status improved Now off biPAP Minimal oxygen support   REVIEW OF SYSTEMS: Positives in BOLD Gen: Denies fever, chills, weight change, fatigue, night sweats HEENT: Denies blurred vision, double vision, hearing loss, tinnitus, sinus congestion, rhinorrhea, sore throat, neck stiffness, dysphagia PULM: shortness of breath, cough, sputum production, hemoptysis, wheezing CV: Denies chest pain, edema, orthopnea, paroxysmal nocturnal dyspnea, palpitations GI: Denies abdominal pain, nausea, vomiting, diarrhea, hematochezia, melena, constipation, change in bowel habits GU: Denies dysuria, hematuria, polyuria, oliguria, urethral discharge Endocrine: Denies hot or cold intolerance, polyuria, polyphagia or appetite change Derm: Denies rash, dry skin, scaling or peeling skin change Heme: Denies easy bruising, bleeding, bleeding gums Neuro: Denies headache, numbness, weakness, slurred speech, loss of memory or consciousness    PHYSICAL EXAMINATION: General:  Chronically ill appearing Caucasian female Neuro:  Lethargic, follows commands, PERRL HEENT:  Supple, no JVD Cardiovascular:  S1S2, RRR, no M/R/G Lungs: diminished throughout, even, non labored Abdomen:  +BS x4, obese soft, non tender, non distended Musculoskeletal:  Normal bulk and tone Skin:  Intact no rashes or lesions    Imaging Dg Chest Port 1 View  Result Date: 11/15/2015 CLINICAL DATA:  Acute respiratory failure. EXAM: PORTABLE CHEST 1 VIEW COMPARISON:  11/14/2015 FINDINGS: Normal heart size and pulmonary vascularity. Emphysematous changes in the lungs with streaky perihilar opacities and interstitial changes likely representing chronic bronchitis. Scattered fibrosis. No pleural  effusions. No pneumothorax. Previous resection or resorption of the distal right clavicle. Calcification of the aorta. IMPRESSION: Emphysematous changes and chronic bronchitic changes in the lungs. No evidence of active pulmonary disease. Electronically Signed   By: Burman NievesWilliam  Stevens M.D.   On: 11/15/2015 05:57   Dg Chest Portable 1 View  Result Date: 11/14/2015 CLINICAL DATA:  Shortness of breath. Difficulty breathing for 4 hours. Decreased oxygenation. History of COPD on home oxygen. Wheezing. EXAM: PORTABLE CHEST 1 VIEW COMPARISON:  07/03/2015 FINDINGS: Emphysematous changes and scattered fibrosis in the lungs. No focal airspace disease or consolidation. Normal heart size and pulmonary vascularity. No pleural effusions or pneumothorax. No significant change since prior study. IMPRESSION: Emphysematous changes and fibrosis in the lungs. No evidence of active pulmonary disease. Electronically Signed   By: Burman NievesWilliam  Stevens M.D.   On: 11/14/2015 22:26   STUDIES:  None  CULTURES: Blood x2 10/2>> Urine 10/2>>  ANTIBIOTICS: Azithromycin 10/2>> Ceftriaxone 10/2>>  SIGNIFICANT EVENTS: 10/2-Pt admitted to [facility] ICU due to acute on chronic hypoxic respiratory failure secondary to CAP and AECOPD requiring Bipap PCCM consulted for management.  LINES/TUBES: PIV's x2 10/2>>  ASSESSMENT / PLAN:  PULMONARY A: Acute on chronic respiratory failure secondary to CAP and AECOPD Hx: Pneumonia and Asthma P:   Bipap prn Supplemental O2 prn  Maintain O2 sats 88% to 92% Continue bronchodilators Continue iv steroids  Prn ABG's and CXR Pulmonary hygiene  CARDIOVASCULAR A:  Hx: Aortic aneurysm, CAD, Hypercholesteremia, Stroke, and HTN P:  Continue outpatient aspirin, lasix and plavix Hold outpatient coreg  Telemetry monitoring  RENAL A:  Acute on chronic renal failure Hx: Renal failure  P:   Trend BMP's Monitor uop Replace electrolytes as indicated  GASTROINTESTINAL A:   Hx: GERD,  Epigastric Abdominal Pain, and Partial Colectomy P:   Protonix for GERD Keep NPO for now  HEMATOLOGIC  A:   No acute issues P:  Trend CBC's Subq heparin for VTE prophylaxis Monitor for s/sx of bleeding Transfuse for hgb <7  INFECTIOUS A:   CAP  P:   Trend WBC's and monitor fever curve Trend PCT's and lactic acids Continue abx as listed above Follow cultures  ENDOCRINE A:   Hyperglycemia Hx: Hypothyroidism  P:   Continue outpatient synthroid SSI  NEUROLOGIC A:   Agitation P:   Avoid sedating medication  Frequent reorientation Lights on during the day  MUSCULOSKELETAL A:  Hx: Spinal stenosis of lumbar region without neurogenic claudication, Osteoporosis, Osteoarthritis, and Olecranon Bursitis P: Prn Tylenol for pain  FAMILY  - Updates: Pts niece at bedside updated about plan of care and questions answered 11/14/15  - Inter-disciplinary family meet or Palliative Care meeting due by:  11/21/15  Sonda Rumble, AGNP  Pulmonary/Critical Care Pager [phone] (please enter 7 digits) PCCM Consult Pager [phone] (please enter 7 digits)     The Patient requires high complexity decision making for assessment and support, frequent evaluation and titration of therapies.   Lucie Leather, M.D.  [facility]  Medical Director [facility] Medical Director [facility] Cardio-Pulmonary Department

## 2015-11-16 LAB — GLUCOSE, CAPILLARY
GLUCOSE-CAPILLARY: 135 mg/dL — AB (ref 65–99)
GLUCOSE-CAPILLARY: 109 mg/dL — AB (ref 65–99)
Glucose-Capillary: 101 mg/dL — ABNORMAL HIGH (ref 65–99)
Glucose-Capillary: 148 mg/dL — ABNORMAL HIGH (ref 65–99)
Glucose-Capillary: 204 mg/dL — ABNORMAL HIGH (ref 65–99)

## 2015-11-16 LAB — PROCALCITONIN: PROCALCITONIN: 0.1 ng/mL

## 2015-11-16 MED ORDER — TIOTROPIUM BROMIDE MONOHYDRATE 18 MCG IN CAPS
18.0000 ug | ORAL_CAPSULE | Freq: Every day | RESPIRATORY_TRACT | Status: DC
  Administered 2015-11-16: 18 ug via RESPIRATORY_TRACT
  Filled 2015-11-16: qty 5

## 2015-11-16 MED ORDER — AZITHROMYCIN 250 MG PO TABS
250.0000 mg | ORAL_TABLET | ORAL | Status: DC
  Administered 2015-11-16: 17:00:00 250 mg via ORAL
  Filled 2015-11-16: qty 1

## 2015-11-16 MED ORDER — MOMETASONE FURO-FORMOTEROL FUM 100-5 MCG/ACT IN AERO
2.0000 | INHALATION_SPRAY | Freq: Two times a day (BID) | RESPIRATORY_TRACT | Status: DC
  Administered 2015-11-16 – 2015-11-17 (×2): 2 via RESPIRATORY_TRACT
  Filled 2015-11-16: qty 8.8

## 2015-11-16 MED ORDER — CEFUROXIME AXETIL 500 MG PO TABS: 500.0000 mg | ORAL_TABLET | Freq: Two times a day (BID) | ORAL | Status: DC

## 2015-11-16 MED ORDER — ACETAMINOPHEN 325 MG PO TABS
650.0000 mg | ORAL_TABLET | Freq: Four times a day (QID) | ORAL | Status: DC | PRN
  Administered 2015-11-16: 09:00:00 650 mg via ORAL
  Filled 2015-11-16: qty 2

## 2015-11-16 NOTE — Care Management (Signed)
Admitted to [facility] with the diagnosis of acute respiratory failure. Lives alone. Niece is Jola BabinskiCindy Cates [phone]) Last seen Dr, Dossie [facility]issman [facility], Home oxygen through [employer] x 5 years. Home Health about a year ago. Doesn't remember name of agency. No skilled facility. Prescriptions are filled per mail order or [facility] [address]. Takes care of all basic activities of daily living herself, doesn't drive. Niece does errands. No falls. Good appetite. Follow[facility] by [facility] in the past. Niece will transport. Gwenette GreetBrenda S Louana Fontenot RN MSN CCM Care Management [phone]

## 2015-11-16 NOTE — Progress Notes (Signed)
[facility] [place] [facility]   PATIENT NAME: Cynthia [name]    MR#:  [id number]  DATE OF BIRTH:  21-Mar-1925  SUBJECTIVE:  Came out from ICU yday Cont with some sob and wheezing Uses 2 liter [facility] oxygen  REVIEW OF SYSTEMS:   Review of Systems  Constitutional: Negative for chills, fever and weight loss.  HENT: Negative for ear discharge, ear pain and nosebleeds.   Eyes: Negative for blurred vision, pain and discharge.  Respiratory: Positive for shortness of breath and wheezing. Negative for sputum production and stridor.   Cardiovascular: Negative for chest pain, palpitations, orthopnea and PND.  Gastrointestinal: Negative for abdominal pain, diarrhea, nausea and vomiting.  Genitourinary: Negative for frequency and urgency.  Musculoskeletal: Negative for back pain and joint pain.  Neurological: Positive for weakness. Negative for sensory change, speech change and focal weakness.  Psychiatric/Behavioral: Negative for depression and hallucinations. The patient is not nervous/anxious.    Tolerating Diet:yes Tolerating PT: pending  DRUG ALLERGIES:   Allergies  Allergen Reactions  . Nitrofurantoin Monohyd Macro Shortness Of Breath  . Levaquin [Levofloxacin] Rash  . Adhesive [Tape] Other (See Comments)    "pull skin off"  . Codeine Itching  . Crestor [Rosuvastatin] Other (See Comments)    Reaction:  Dizziness   . Duratuss G [Guaifenesin] Nausea And Vomiting  . Oxycodone-Acetaminophen Itching  . Penicillins Other (See Comments)    Unsure   . Propoxyphene Itching  . Tramadol Itching  . Morphine Itching and Rash    VITALS:  Blood pressure (!) 134/49, pulse 77, temperature 98.4 F (36.9 C), temperature source Oral, resp. rate (!) 22, height 5\' 5"  (1.651 m), weight 62.2 kg (137 lb 2 oz), SpO2 98 %.  PHYSICAL EXAMINATION:   Physical Exam  GENERAL:  82 y.o.-year-old patient lying in the bed with no acute distress.  EYES: Pupils equal, round,  reactive to light and accommodation. No scleral icterus. Extraocular muscles intact.  HEENT: Head atraumatic, normocephalic. Oropharynx and nasopharynx clear.  NECK:  Supple, no jugular venous distention. No thyroid enlargement, no tenderness.  LUNGS: Normal breath sounds bilaterally, ++ wheezing,no rales, rhonchi. No use of accessory muscles of respiration.  CARDIOVASCULAR: S1, S2 normal. No murmurs, rubs, or gallops.  ABDOMEN: Soft, nontender, nondistended. Bowel sounds present. No organomegaly or mass.  EXTREMITIES: No cyanosis, clubbing or edema b/l.    NEUROLOGIC: Cranial nerves II through XII are intact. No focal Motor or sensory deficits b/l.   PSYCHIATRIC:  patient is alert and oriented x 3.  SKIN: No obvious rash, lesion, or ulcer.   LABORATORY PANEL:  CBC  Recent Labs Lab 11/15/15 0447  WBC 7.1  HGB 12.0  HCT 36.5  PLT 205    Chemistries   Recent Labs Lab 11/15/15 0447  NA 138  K 4.0  CL 99*  CO2 31  GLUCOSE 163*  BUN 27*  CREATININE 1.30*  CALCIUM 9.0  MG 2.1   Cardiac Enzymes  Recent Labs Lab 11/14/15 2124  TROPONINI 0.18*   RADIOLOGY:  Dg Chest Port 1 View  Result Date: 11/15/2015 CLINICAL DATA:  Acute respiratory failure. EXAM: PORTABLE CHEST 1 VIEW COMPARISON:  11/14/2015 FINDINGS: Normal heart size and pulmonary vascularity. Emphysematous changes in the lungs with streaky perihilar opacities and interstitial changes likely representing chronic bronchitis. Scattered fibrosis. No pleural effusions. No pneumothorax. Previous resection or resorption of the distal right clavicle. Calcification of the aorta. IMPRESSION: Emphysematous changes and chronic bronchitic changes in the lungs. No evidence of  active pulmonary disease. Electronically Signed   By: Burman Nieves M.D.   On: 11/15/2015 05:57   Dg Chest Portable 1 View  Result Date: 11/14/2015 CLINICAL DATA:  Shortness of breath. Difficulty breathing for 4 hours. Decreased oxygenation. History of  COPD on home oxygen. Wheezing. EXAM: PORTABLE CHEST 1 VIEW COMPARISON:  07/03/2015 FINDINGS: Emphysematous changes and scattered fibrosis in the lungs. No focal airspace disease or consolidation. Normal heart size and pulmonary vascularity. No pleural effusions or pneumothorax. No significant change since prior study. IMPRESSION: Emphysematous changes and fibrosis in the lungs. No evidence of active pulmonary disease. Electronically Signed   By: Burman Nieves M.D.   On: 11/14/2015 22:26   ASSESSMENT AND PLAN:  1.Acute on chronic respiratory failure secondary to  AECOPD -CXR shows no pneumonia. Chronic emphysema Off bipap Supplemental O2 prn  Maintain O2 sats 88% to 92% Continue bronchodilators Continue iv steroids  On IV rocephin and zithromax---change to po zithromax  2 history of CAD continue cardiac meds Patient on aspirin and Plavix  3. Acute on chronic renal failure -Creatinine stable. Patient received some IV fluids  4. GERD continue Protonix  5. Hypothyroidism continue Synthroid  6. History of hypoglycemia likely steroid induced  7. DVT prophylaxis subcutaneous heparin Case discussed with Care Management/Social Worker. Management plans discussed with the patient, family and they are in agreement.  CODE STATUS: DO NOT RESUSCITATE    TOTAL TIME TAKING CARE OF THIS PATIENT: .  >50% time spent on counselling and coordination of care  POSSIBLE D/C IN 1-2 DAYS, DEPENDING ON CLINICAL CONDITION.  Note: This dictation was prepared with Dragon dictation along with smaller phrase technology. Any transcriptional errors that result from this process are unintentional.  Loney Peto M.D on 11/16/2015 at 1:38 PM  Between 7am to 6pm - Pager - [phone]  After 6pm go to [email or web address] - password [email or web address] [facility]  [place] [facility]  Office  [phone]  CC: Primary care physician; Mila Merry, MD

## 2015-11-16 NOTE — Care Management Important Message (Signed)
Important Message  Patient Details  Name: West PughDaisy W Emert MRN: [id number] Date of Birth: 07/19/1925   Medicare Important Message Given:  Yes    Gwenette GreetBrenda S Zay Yeargan, RN 11/16/2015, 8:41 AM

## 2015-11-16 NOTE — Plan of Care (Signed)
Problem: Physical Regulation: Goal: Will remain free from infection Outcome: Progressing Patient is diagnosed with Community Acquired Pneumonia. Vital signs had been stable and afebrile. Continues to receive IV antibiotics for CAP. Kept safe and comfortable. Needs attended.

## 2015-11-16 NOTE — Progress Notes (Signed)
[name]   Name: Cynthia [name] MRN: [id number] DOB: 12-Jun-1925    ADMISSION DATE:  11/14/2015 CONSULTATION DATE:  11/14/2015  REFERRING MD:  Dr. Sharma Cynthia [name]  CHIEF COMPLAINT: Respiratory Distress  HISTORY OF PRESENT ILLNESS:   resp status improved Minimal oxygen support  Patient with low BP Patients wants cardiologist Dr Cynthia [name] to come by and assess needs  REVIEW OF SYSTEMS: Positives in BOLD Gen: Denies fever, chills, weight change, fatigue, night sweats HEENT: Denies blurred vision, double vision, hearing loss, tinnitus, sinus congestion, rhinorrhea, sore throat, neck stiffness, dysphagia PULM: shortness of breath, hemoptysis, wheezing CV: Denies chest pain, edema, orthopnea, paroxysmal nocturnal dyspnea, palpitations GI: Denies abdominal pain, nausea, vomiting, diarrhea, hematochezia, melena, constipation, change in bowel habits GU: Denies dysuria, hematuria, polyuria, oliguria, urethral discharge Endocrine: Denies hot or cold intolerance, polyuria, polyphagia or appetite change Derm: Denies rash, dry skin, scaling or peeling skin change Heme: Denies easy bruising, bleeding, bleeding gums Neuro: Denies headache, numbness, weakness, slurred speech, loss of memory or consciousness    PHYSICAL EXAMINATION: General:  Chronically ill appearing Caucasian female Neuro:   follows commands, PERRL HEENT:  Supple, no JVD Cardiovascular:  S1S2, RRR, no M/R/G Lungs: diminished throughout, even, non labored Abdomen:  +BS x4, obese soft, non tender, non distended Musculoskeletal:  Normal bulk and tone Skin:  Intact no rashes or lesions    CULTURES: Blood x2 10/2>> Urine 10/2>>  ANTIBIOTICS: Azithromycin 10/2>> Ceftriaxone 10/2>>  SIGNIFICANT EVENTS: 10/2-[facility] due to acute on chronic hypoxic respiratory failure secondary to CAP and AECOPD requiring Bipap PCCM consulted for management.  LINES/TUBES: PIV's x2 10/2>>  ASSESSMENT /  PLAN:  82 yo white female seen for acute COPD exacerbation -resolving   [name] A: Acute on chronic respiratory failure secondary to CAP and AECOPD Hx: Pneumonia and Asthma P:   Supplemental O2 prn  Maintain O2 sats 88% to 92% Continue bronchodilators Continue iv steroids  Prn ABG's and CXR [name] hygiene -start dulera and spiriva  CARDIOVASCULAR A:  Hx: Aortic aneurysm, CAD, Hypercholesteremia, Stroke, and HTN P:  Continue outpatient aspirin, lasix and plavix Hold outpatient coreg  Telemetry monitoring Patient wants Dr. Gwen [name] to come by and assess meds     The Patient requires high complexity decision making for assessment and support, frequent evaluation and titration of therapies.   Cynthia [name], M.D.  [name]  Medical Director [facility] Medical Director [name]

## 2015-11-16 NOTE — Evaluation (Signed)
Physical Therapy Evaluation Patient Details Name: Cynthia [name] MRN: [id number] DOB: [birth year] Today's Date: 11/16/2015   History of Present Illness  Pt is a 82 y/o female presenting with increased SOB and admitted for acute respiratory failure with hypoxia. Pt is 2.5 L of O2 at home. PT consulted MD for elevated troponin reading on 11/14/15 and reported it was attributed to demand ischemia and cleared the pt for activity with PT. PMH of stroke, pneumonia, respiratory failure, renal failure, osteoporsis, osteoarthritis, olecranon bursitis, COPD, CAD, and stenosis of lumbar region without claudication.   Clinical Impression  Pt lives at home alone with 3 steps and R hand rail to enter. Pt has family that check on pt frequently and helps with cleaning/as needed. Otherwise, pt is independent with rollator and ADL's. At this time pt is able to complete bed mobility supervision for safety only with seated BP 137/85. Pt able to transfer sit to stand min guard for safety with BP at 111/95 though patient asymptomatic and continued to be very alert and communicative. Pt O2 monitored throughout session at 96% on 3 L/min nasal cannula. Pt able to ambulate 230 ft min guard using RW without concern and tolerated moderate balance challenges well. Pt reports being at her baseline and does not require any further PT services at this time. PT has no concerns with patient returning home.     Follow Up Recommendations No PT follow up    Equipment Recommendations  Rolling walker with 5" wheels (Pt has rollator at home)    Recommendations for Other Services       Precautions / Restrictions Precautions Precautions: Fall Restrictions Weight Bearing Restrictions: No      Mobility  Bed Mobility Overal bed mobility: Needs Assistance Bed Mobility: Sidelying to Sit   Sidelying to sit: Supervision;HOB elevated       General bed mobility comments: pt able to complete without bedrail use and HOB elevated (pt  reports this is how she gets up at home)   Transfers Overall transfer level: Needs assistance Equipment used: Rolling walker (2 wheeled) Transfers: Sit to/from Stand Sit to Stand: Min guard         General transfer comment: no LOB; pt requires min vc's for safer hand placement  Ambulation/Gait Ambulation/Gait assistance: Min guard Ambulation Distance (Feet): 230 Feet Assistive device: Rolling walker (2 wheeled) Gait Pattern/deviations: WFL(Within Functional Limits)   Gait velocity interpretation: at or above normal speed for age/gender    Stairs            Wheelchair Mobility    Modified Rankin (Stroke Patients Only)       Balance Overall balance assessment: Needs assistance Sitting-balance support: Feet supported;No upper extremity supported Sitting balance-Leahy Scale: Good Sitting balance - Comments: pt able to sit EOB with good posture and no LOB     Standing balance-Leahy Scale: Good Standing balance comment: Pt able to tolerate vertical and horizontal head turns and change of speed without pause or LOB; min guard for safety only                             Pertinent Vitals/Pain Pain Assessment: No/denies pain    Home Living Family/patient expects to be discharged to:: Private residence Living Arrangements: Alone Available Help at Discharge: Family Type of Home: House Home Access: Stairs to enter Entrance Stairs-Rails: Right Entrance Stairs-Number of Steps: 3 Home Layout: One level Home Equipment: Walker - 4 wheels;Wheelchair - [employer]manual;Hospital  bed;Grab bars - tub/shower;Shower seat;Toilet riser      Prior Function Level of Independence: Independent with assistive device(s)         Comments: Pt uses rollator and O2 at home (2.5 L)     Hand Dominance   Dominant Hand: Right    Extremity/Trunk Assessment   Upper Extremity Assessment: Overall WFL for tasks assessed (B shoulder flexion 4/5; gross grip strength R slightly > L and  WFL's)           Lower Extremity Assessment: Overall WFL for tasks assessed (B hip flexion 5/5; B knee extension at least 3/5 AROM)         Communication   Communication: No difficulties  Cognition Arousal/Alertness: Awake/alert Behavior During Therapy: WFL for tasks assessed/performed Overall Cognitive Status: Within Functional Limits for tasks assessed                      General Comments General comments (skin integrity, edema, etc.): Pt is agreeable and eager for PT session.     Exercises     Assessment/Plan    PT Assessment Patent does not need any further PT services  PT Problem List            PT Treatment Interventions      PT Goals (Current goals can be found in the Care Plan section)  Acute Rehab PT Goals Patient Stated Goal: To go home.  PT Goal Formulation: With patient Time For Goal Achievement: 11/30/15 Potential to Achieve Goals: Good    Frequency     Barriers to discharge        Co-evaluation               End of Session Equipment Utilized During Treatment: Gait belt;Oxygen Activity Tolerance: Patient tolerated treatment well Patient left: in chair;with call bell/phone within reach;with chair alarm set;with family/visitor present Nurse Communication: Mobility status;Precautions         Time: 1610-9604 PT Time Calculation (min) (ACUTE ONLY): 32 min   Charges:         PT G Codes:        Albertina Senegal, SPT 11/16/2015, 4:37 PM

## 2015-11-17 LAB — GLUCOSE, CAPILLARY
GLUCOSE-CAPILLARY: 171 mg/dL — AB (ref 65–99)
Glucose-Capillary: 173 mg/dL — ABNORMAL HIGH (ref 65–99)
Glucose-Capillary: 140 mg/dL — ABNORMAL HIGH (ref 65–99)

## 2015-11-17 LAB — URINE CULTURE: CULTURE: NO GROWTH

## 2015-11-17 MED ORDER — AZITHROMYCIN 250 MG PO TABS: ORAL_TABLET | ORAL | 0 refills | Status: DC

## 2015-11-17 MED ORDER — PREDNISONE 10 MG PO TABS: 50.0000 mg | ORAL_TABLET | Freq: Every day | ORAL | 0 refills | Status: DC

## 2015-11-17 MED ORDER — PREDNISONE 50 MG PO TABS
50.0000 mg | ORAL_TABLET | Freq: Every day | ORAL | Status: DC
  Administered 2015-11-17: 12:00:00 50 mg via ORAL
  Filled 2015-11-17: qty 1

## 2015-11-17 NOTE — Discharge Instructions (Signed)
Use your oxygen and nebs as before °

## 2015-11-17 NOTE — Discharge Summary (Signed)
[facility]   PATIENT NAME: Cynthia [name]    MR#:  [id number]  DATE OF BIRTH:  1926/01/02  DATE OF ADMISSION:  11/14/2015 ADMITTING PHYSICIAN: Erin Fulling, MD   DATE OF DISCHARGE: 11/17/15  PRIMARY CARE PHYSICIAN: Mila Merry, MD    ADMISSION DIAGNOSIS:  Respiratory distress [R06.00] Acute renal insufficiency [N28.9] COPD exacerbation (HCC) [J44.1] NSTEMI (non-ST elevated myocardial infarction) (HCC) [I21.4]  DISCHARGE DIAGNOSIS:  Acute on chronic hypoxic respiratory failure due to COPD flare  chronic home oxygen SECONDARY DIAGNOSIS:   Past Medical History:  Diagnosis Date  . Abdominal pain, epigastric   . Allergic drug reaction   . Aneurysm of aorta (HCC)   . Asthma   . Breathlessness on exertion   . CAD (coronary artery disease)   . COPD (chronic obstructive pulmonary disease) (HCC)   . GERD (gastroesophageal reflux disease)   . History of partial colectomy   . Hypercholesteremia   . Hypertension   . Hypothyroidism   . Hypoxia   . Insomnia   . Nocturia   . Olecranon bursitis   . Osteoarthritis   . Osteoporosis   . Pneumonia   . Renal failure   . Seborrheic keratoses   . Spinal stenosis of lumbar region without neurogenic claudication   . Stomatitis monilial   . Stroke (HCC)   . UTI symptoms   . Vaginal atrophy     HOSPITAL COURSE:   1.Acute on chronic respiratory failure secondary to  AECOPD -CXR shows no pneumonia. Chronic emphysema Off bipap Supplemental O2 prn  Maintain O2 sats 88% to 92% Continue bronchodilators Continue iv steroids ----change to po steroid taper -pt takes chronic prednisone 5 mg daily On IV rocephin and zithromax---change to po zithromax  2 history of CAD continue cardiac meds Patient on aspirin and Plavix  3. Acute on chronic renal failure -Creatinine stable. Patient received some IV fluids  4. GERD continue Protonix  5. Hypothyroidism continue Synthroid  6.  History of hyperglycemia likely steroid induced  No PT needs at home Doing well. D/c home  CONSULTS OBTAINED:  Treatment Team:  Earline Mayotte, MD Lamar Blinks, MD  DRUG ALLERGIES:   Allergies  Allergen Reactions  . Nitrofurantoin Monohyd Macro Shortness Of Breath  . Levaquin [Levofloxacin] Rash  . Adhesive [Tape] Other (See Comments)    "pull skin off"  . Codeine Itching  . Crestor [Rosuvastatin] Other (See Comments)    Reaction:  Dizziness   . Duratuss G [Guaifenesin] Nausea And Vomiting  . Oxycodone-Acetaminophen Itching  . Penicillins Other (See Comments)    Unsure   . Propoxyphene Itching  . Tramadol Itching  . Morphine Itching and Rash    DISCHARGE MEDICATIONS:   Current Discharge Medication List    START taking these medications   Details  azithromycin (ZITHROMAX) 250 MG tablet Take as directed Qty: 3 each, Refills: 0      CONTINUE these medications which have CHANGED   Details  !! predniSONE (DELTASONE) 10 MG tablet Take 5 tablets (50 mg total) by mouth daily with breakfast. Start 50 mg and thenTaper by 10 mg daily and thereafter resume our 5 mg daily (chronic) Qty: 15 tablet, Refills: 0     !! - Potential duplicate medications found. Please discuss with provider.    CONTINUE these medications which have NOT CHANGED   Details  acetaminophen (TYLENOL) 325 MG tablet Take 1-2 tablets (325-650 mg total) by mouth every 4 (four) hours as needed for  mild pain (or temp >/= 101 F).    albuterol (PROVENTIL HFA;VENTOLIN HFA) 108 (90 Base) MCG/ACT inhaler Inhale 2 puffs into the lungs every 6 (six) hours as needed for wheezing or shortness of breath. Qty: 3 Inhaler, Refills: 4   Associated Diagnoses: COPD with acute exacerbation (HCC)    albuterol (PROVENTIL) (2.5 MG/3ML) 0.083% nebulizer solution Take 2.5 mg by nebulization every 6 (six) hours as needed for wheezing or shortness of breath. Pt mixes with her Atrovent nebulizer solution.    benzonatate  (TESSALON) 100 MG capsule Take 2 capsules (200 mg total) by mouth 3 (three) times daily as needed for cough. Qty: 30 capsule, Refills: 3   Associated Diagnoses: COPD exacerbation (HCC)    Calcium Carb-Cholecalciferol (CALCIUM 600+D3) 600-800 MG-UNIT TABS Take 1 tablet by mouth daily.    carvedilol (COREG) 3.125 MG tablet Take 3.125 mg by mouth 2 (two) times daily with a meal.    clopidogrel (PLAVIX) 75 MG tablet Take 1 tablet (75 mg total) by mouth daily.    dextromethorphan-guaiFENesin (MUCINEX DM) 30-600 MG 12hr tablet Take 1 tablet by mouth 2 (two) times daily as needed.     diclofenac sodium (VOLTAREN) 1 % GEL Apply 2 g topically 4 (four) times daily as needed (for pain). Reported on 07/15/2015    fexofenadine (ALLEGRA) 180 MG tablet Take 180 mg by mouth daily.    fluticasone furoate-vilanterol (BREO ELLIPTA) 200-25 MCG/INH AEPB Inhale 1 puff into the lungs daily.    furosemide (LASIX) 20 MG tablet Take 20 mg by mouth daily.    ipratropium (ATROVENT) 0.02 % nebulizer solution Take 2.5 mLs (0.5 mg total) by nebulization every 6 (six) hours as needed for wheezing or shortness of breath. Pt mixes with her albuterol nebulizer solution. Qty: 75 mL, Refills: 12    lansoprazole (PREVACID) 30 MG capsule Take 1 capsule (30 mg total) by mouth daily. Qty: 90 capsule, Refills: 3    levothyroxine (SYNTHROID, LEVOTHROID) 75 MCG tablet Take 75 mcg by mouth daily before breakfast.    Magnesium Oxide 250 MG TABS Take 250 mg by mouth 2 (two) times daily.     nitroGLYCERIN (NITROSTAT) 0.4 MG SL tablet Place 0.4 mg under the tongue every 5 (five) minutes as needed for chest pain. Reported on 06/14/2015    Potassium Gluconate 595 MG CAPS Take 595 mg by mouth every morning. Reported on 07/15/2015    !! predniSONE (DELTASONE) 5 MG tablet Take 5 mg by mouth daily with breakfast.    Tiotropium Bromide Monohydrate (SPIRIVA RESPIMAT) 2.5 MCG/ACT AERS Inhale 1 puff into the lungs daily at 8 pm.    vitamin C  (ASCORBIC ACID) 500 MG tablet Take 500 mg by mouth daily. Reported on 09/02/2015     !! - Potential duplicate medications found. Please discuss with provider.    STOP taking these medications     aspirin EC 81 MG tablet      cholecalciferol (VITAMIN D) 1000 UNITS tablet      ferrous sulfate 325 (65 FE) MG tablet      Omega-3 Fatty Acids (FISH OIL) 1000 MG CAPS      tiotropium (SPIRIVA) 18 MCG inhalation capsule         If you experience worsening of your admission symptoms, develop shortness of breath, life threatening emergency, suicidal or homicidal thoughts you must seek medical attention immediately by calling 911 or calling your MD immediately  if symptoms less severe.  You Must read complete instructions/literature along with all the  possible adverse reactions/side effects for all the Medicines you take and that have been prescribed to you. Take any new Medicines after you have completely understood and accept all the possible adverse reactions/side effects.   Please note  You were cared for by a hospitalist during your hospital stay. If you have any questions about your discharge medications or the care you received while you were in the hospital after you are discharged, you can call the unit and asked to speak with the hospitalist on call if the hospitalist that took care of you is not available. Once you are discharged, your primary care physician will handle any further medical issues. Please note that NO REFILLS for any discharge medications will be authorized once you are discharged, as it is imperative that you return to your primary care physician (or establish a relationship with a primary care physician if you do not have one) for your aftercare needs so that they can reassess your need for medications and monitor your lab values. Today   SUBJECTIVE   Doing well VITAL SIGNS:  Blood pressure 133/62, pulse 68, temperature 98.2 F (36.8 C), temperature source Oral, resp.  rate 18, height 5\' 5"  (1.651 m), weight 62 kg (136 lb 11 oz), SpO2 97 %.  I/O:   Intake/Output Summary (Last 24 hours) at 11/17/15 0848 Last data filed at 11/17/15 0607  Gross per 24 hour  Intake              720 ml  Output                0 ml  Net              720 ml    PHYSICAL EXAMINATION:  GENERAL:  82 y.o.-year-old patient lying in the bed with no acute distress.  EYES: Pupils equal, round, reactive to light and accommodation. No scleral icterus. Extraocular muscles intact.  HEENT: Head atraumatic, normocephalic. Oropharynx and nasopharynx clear.  NECK:  Supple, no jugular venous distention. No thyroid enlargement, no tenderness.  LUNGS: Normal breath sounds bilaterally, no wheezing, rales,rhonchi or crepitation. No use of accessory muscles of respiration.  CARDIOVASCULAR: S1, S2 normal. No murmurs, rubs, or gallops.  ABDOMEN: Soft, non-tender, non-distended. Bowel sounds present. No organomegaly or mass.  EXTREMITIES: No pedal edema, cyanosis, or clubbing.  NEUROLOGIC: Cranial nerves II through XII are intact. Muscle strength 5/5 in all extremities. Sensation intact. Gait not checked.  PSYCHIATRIC: The patient is alert and oriented x 3.  SKIN: No obvious rash, lesion, or ulcer.   DATA REVIEW:   CBC   Recent Labs Lab 11/15/15 0447  WBC 7.1  HGB 12.0  HCT 36.5  PLT 205    Chemistries   Recent Labs Lab 11/15/15 0447  NA 138  K 4.0  CL 99*  CO2 31  GLUCOSE 163*  BUN 27*  CREATININE 1.30*  CALCIUM 9.0  MG 2.1    Microbiology Results   Recent Results (from the past 240 hour(s))  Urine culture     Status: None   Collection Time: 11/14/15  2:10 AM  Result Value Ref Range Status   Specimen Description URINE, RANDOM  Final   Special Requests NONE  Final   Culture NO GROWTH [facility]   Final   Report Status 11/17/2015 FINAL  Final  Blood culture (routine x 2)     Status: None (Preliminary result)   Collection Time: 11/14/15  9:56 PM   Result Value Ref  Range Status   Specimen Description BLOOD LEFT HAND  Final   Special Requests   Final    BOTTLES DRAWN AEROBIC AND ANAEROBIC  ANA AER   Culture NO GROWTH 3 DAYS  Final   Report Status PENDING  Incomplete  Blood culture (routine x 2)     Status: None (Preliminary result)   Collection Time: 11/14/15  9:56 PM  Result Value Ref Range Status   Specimen Description BLOOD RIGHT HAND  Final   Special Requests   Final    BOTTLES DRAWN AEROBIC AND ANAEROBIC  ANA . AER   Culture NO GROWTH 3 DAYS  Final   Report Status PENDING  Incomplete  MRSA PCR Screening     Status: None   Collection Time: 11/15/15  2:46 AM  Result Value Ref Range Status   MRSA by PCR NEGATIVE NEGATIVE Final    Comment:        The GeneXpert MRSA Assay (FDA approved for NASAL specimens only), is one component of a comprehensive MRSA colonization surveillance program. It is not intended to diagnose MRSA infection nor to guide or monitor treatment for MRSA infections.   Culture, expectorated sputum-assessment     Status: None   Collection Time: 11/15/15  1:17 PM  Result Value Ref Range Status   Specimen Description EXPECTORATED SPUTUM  Final   Special Requests NONE  Final   Sputum evaluation   Final    Sputum specimen not acceptable for testing.  Please recollect.     Report Status 11/15/2015 FINAL  Final    RADIOLOGY:  No results found.   Management plans discussed with the patient, family and they are in agreement.  CODE STATUS:     Code Status Orders        Start     Ordered   11/14/15 2349  Do not attempt resuscitation (DNR)  Continuous    Question Answer Comment  In the event of cardiac or respiratory ARREST Do not call a "code blue"   In the event of cardiac or respiratory ARREST Do not perform Intubation, CPR, defibrillation or ACLS   In the event of cardiac or respiratory ARREST Use medication by any route, position, wound care, and other measures to relive pain  and suffering. May use oxygen, suction and manual treatment of airway obstruction as needed for comfort.      11/14/15 2352    Code Status History    Date Active Date Inactive Code Status Order ID Comments User Context   07/03/2015 12:49 PM 07/05/2015  6:39 PM DNR [id number]  Milagros Loll, MD ED   04/21/2015  9:29 AM 04/23/2015  5:46 PM Full Code [id number]  Erin Fulling, MD Inpatient   02/07/2015 12:11 AM 02/08/2015  4:53 PM DNR [id number]  Ramonita Lab, MD Inpatient   02/06/2015  9:48 PM 02/07/2015 12:11 AM DNR [id number]  Ramonita Lab, MD ED    Advance Directive Documentation   Flowsheet Row Most Recent Value  Type of Advance Directive  Living will, Healthcare Power of Attorney  Pre-existing out of facility DNR order (yellow form or pink MOST form)  No data  "MOST" Form in Place?  No data      TOTAL TIME TAKING CARE OF THIS PATIENT: 40 minutes.    Arriyanna Mersch M.D on 11/17/2015 at 8:48 AM  Between 7am to 6pm - Pager - [phone] After 6pm go to [email or web address] - password [email or web address] [facility]  [place] [facility]  Office  [phone]  CC:  Primary care physician; Mila Merry, MD

## 2015-11-17 NOTE — Consult Note (Signed)
[facility] Cardiology Consultation Note  Patient ID: Cynthia [name], MRN: [id number], DOB/AGE: [birth year] 82 y.o. Admit date: 11/14/2015   Date of Consult: 11/17/2015 Primary Physician: Mila Merry, MD Primary Cardiologist: Gwen Pounds  Chief Complaint:  Chief Complaint  Patient presents with  . Respiratory Distress   Reason for Consult: respiratory failure  HPI: 82 y.o. female with known peripheral vascular disease and abdominal aortic aneurysm and known coronary artery disease with chronic kidney disease stage III having acute respiratory failure requiring BiPAP as well as oxygenation and help other medical supportive care. Patient is slowly improving at this time. Patient had no evidence of chest pain during this hospitalization and troponin was 0.18 more consistent with demand ischemia rather than acute coronary syndrome. She has been on Plavix for her peripheral vascular disease and recently stopped aspirin due to easy bruising. Additionally the head furosemide for lower extremity edema and chronic kidney disease for which the has continued to help with no current evidence of congestive heart failure. The patient has had slow improvements of symptoms over the last few days and there was a discontinuation of carvedilol due to hypotension during this hospitalization. We have discussed the possibility of reinstatement as an outpatient depending on improvements and rehabilitation  Past Medical History:  Diagnosis Date  . Abdominal pain, epigastric   . Allergic drug reaction   . Aneurysm of aorta (HCC)   . Asthma   . Breathlessness on exertion   . CAD (coronary artery disease)   . COPD (chronic obstructive pulmonary disease) (HCC)   . GERD (gastroesophageal reflux disease)   . History of partial colectomy   . Hypercholesteremia   . Hypertension   . Hypothyroidism   . Hypoxia   . Insomnia   . Nocturia   . Olecranon bursitis   . Osteoarthritis   . Osteoporosis   . Pneumonia   .  Renal failure   . Seborrheic keratoses   . Spinal stenosis of lumbar region without neurogenic claudication   . Stomatitis monilial   . Stroke (HCC)   . UTI symptoms   . Vaginal atrophy       Surgical History:  Past Surgical History:  Procedure Laterality Date  . APPENDECTOMY    . APPLICATION OF WOUND VAC Right 05/26/2015   Procedure: APPLICATION OF WOUND VAC;  Surgeon: Annice Needy, MD;  Location: [facility] ORS;  Service: Vascular;  Laterality: Right;  . BACK SURGERY    . COLON SURGERY     Hole in colon that was repaired  . EYE SURGERY Bilateral    Cataract extraction with IOL  . Multilevel spinal fusion and laminectomy  2010   Due to spinal stenosis Dr. Gerrit Heck  . PERIPHERAL VASCULAR CATHETERIZATION N/A 03/16/2015   Procedure: Abdominal Aortogram w/Lower Extremity;  Surgeon: Renford Dills, MD;  Location: [facility] INVASIVE CV LAB;  Service: Cardiovascular;  Laterality: N/A;  . PERIPHERAL VASCULAR CATHETERIZATION  03/16/2015   Procedure: Lower Extremity Intervention;  Surgeon: Renford Dills, MD;  Location: [facility] INVASIVE CV LAB;  Service: Cardiovascular;;  . PERIPHERAL VASCULAR CATHETERIZATION Bilateral 04/20/2015   Procedure: Endovascular Repair/Stent Graft;  Surgeon: Renford Dills, MD;  Location: [facility] INVASIVE CV LAB;  Service: Cardiovascular;  Laterality: Bilateral;  . Rotator cuff surgery  2007  . VAGINAL HYSTERECTOMY  1960  . WOUND DEBRIDEMENT Right 05/26/2015   Procedure: DEBRIDEMENT WOUND ( RIGHT GROIN DEBRIDEMENT );  Surgeon: Annice Needy, MD;  Location: [facility] ORS;  Service: Vascular;  Laterality: Right;  .  WRIST SURGERY     lump removed due to pain     Home Meds: Prior to Admission medications   Medication Sig Start Date End Date Taking? Authorizing Provider  acetaminophen (TYLENOL) 325 MG tablet Take 1-2 tablets (325-650 mg total) by mouth every 4 (four) hours as needed for mild pain (or temp >/= 101 F). 04/23/15  Yes Kimberly A Stegmayer, PA-C  albuterol (PROVENTIL  HFA;VENTOLIN HFA) 108 (90 Base) MCG/ACT inhaler Inhale 2 puffs into the lungs every 6 (six) hours as needed for wheezing or shortness of breath. 02/15/15  Yes Dennis E Chrismon, PA  albuterol (PROVENTIL) (2.5 MG/3ML) 0.083% nebulizer solution Take 2.5 mg by nebulization every 6 (six) hours as needed for wheezing or shortness of breath. Pt mixes with her Atrovent nebulizer solution.   Yes Historical Provider, MD  benzonatate (TESSALON) 100 MG capsule Take 2 capsules (200 mg total) by mouth 3 (three) times daily as needed for cough. 09/02/15  Yes Jodell Cipro Chrismon, PA  Calcium Carb-Cholecalciferol (CALCIUM 600+D3) 600-800 MG-UNIT TABS Take 1 tablet by mouth daily.   Yes Historical Provider, MD  carvedilol (COREG) 3.125 MG tablet Take 3.125 mg by mouth 2 (two) times daily with a meal.   Yes Historical Provider, MD  clopidogrel (PLAVIX) 75 MG tablet Take 1 tablet (75 mg total) by mouth daily. 04/23/15  Yes Kimberly A Stegmayer, PA-C  dextromethorphan-guaiFENesin (MUCINEX DM) 30-600 MG 12hr tablet Take 1 tablet by mouth 2 (two) times daily as needed.    Yes Historical Provider, MD  diclofenac sodium (VOLTAREN) 1 % GEL Apply 2 g topically 4 (four) times daily as needed (for pain). Reported on 07/15/2015   Yes Historical Provider, MD  fexofenadine (ALLEGRA) 180 MG tablet Take 180 mg by mouth daily.   Yes Historical Provider, MD  fluticasone furoate-vilanterol (BREO ELLIPTA) 200-25 MCG/INH AEPB Inhale 1 puff into the lungs daily.   Yes Historical Provider, MD  furosemide (LASIX) 20 MG tablet Take 20 mg by mouth daily.   Yes Historical Provider, MD  ipratropium (ATROVENT) 0.02 % nebulizer solution Take 2.5 mLs (0.5 mg total) by nebulization every 6 (six) hours as needed for wheezing or shortness of breath. Pt mixes with her albuterol nebulizer solution. 04/23/15  Yes Kimberly A Stegmayer, PA-C  lansoprazole (PREVACID) 30 MG capsule Take 1 capsule (30 mg total) by mouth daily. 07/15/15  Yes Malva Limes, MD   levothyroxine (SYNTHROID, LEVOTHROID) 75 MCG tablet Take 75 mcg by mouth daily before breakfast.   Yes Historical Provider, MD  Magnesium Oxide 250 MG TABS Take 250 mg by mouth 2 (two) times daily.    Yes Historical Provider, MD  nitroGLYCERIN (NITROSTAT) 0.4 MG SL tablet Place 0.4 mg under the tongue every 5 (five) minutes as needed for chest pain. Reported on 06/14/2015   Yes Historical Provider, MD  Potassium Gluconate 595 MG CAPS Take 595 mg by mouth every morning. Reported on 07/15/2015   Yes Historical Provider, MD  predniSONE (DELTASONE) 5 MG tablet Take 5 mg by mouth daily with breakfast.   Yes Historical Provider, MD  Tiotropium Bromide Monohydrate (SPIRIVA RESPIMAT) 2.5 MCG/ACT AERS Inhale 1 puff into the lungs daily at 8 pm.   Yes Historical Provider, MD  vitamin C (ASCORBIC ACID) 500 MG tablet Take 500 mg by mouth daily. Reported on 09/02/2015   Yes Historical Provider, MD  aspirin EC 81 MG tablet Take 81 mg by mouth daily.     Historical Provider, MD  cholecalciferol (VITAMIN D) 1000 UNITS  tablet Take 1,000 Units by mouth daily. Reported on 06/14/2015    Historical Provider, MD  ferrous sulfate 325 (65 FE) MG tablet Take 325 mg by mouth daily.     Historical Provider, MD  levothyroxine (SYNTHROID, LEVOTHROID) 75 MCG tablet TAKE 1 TABLET EVERY DAY 07/17/15   Jodell Cipro Chrismon, PA  Omega-3 Fatty Acids (FISH OIL) 1000 MG CAPS Take 1,000 mg by mouth daily. Reported on 07/15/2015    Historical Provider, MD  predniSONE (DELTASONE) 10 MG tablet Take 1 tablet (10 mg total) by mouth daily with breakfast. RESTART THIS AFTER FINISHING OFF THE PREDNISONE TAPER IN 12 DAYS Patient not taking: Reported on 11/17/2015 02/08/15   Enid Baas, MD  tiotropium (SPIRIVA) 18 MCG inhalation capsule Place 18 mcg into inhaler and inhale daily.    Historical Provider, MD    Inpatient Medications:  . aspirin  324 mg Oral Once  . aspirin EC  81 mg Oral Daily  . azithromycin  250 mg Oral Q24H  . chlorhexidine  15 mL  Mouth Rinse BID  . clopidogrel  75 mg Oral Daily  . furosemide  20 mg Oral Daily  . heparin  5,000 Units Subcutaneous Q8H  . insulin aspart  0-9 Units Subcutaneous Q4H  . ipratropium-albuterol  3 mL Nebulization Q6H  . levothyroxine  75 mcg Oral QAC breakfast  . mouth rinse  15 mL Mouth Rinse q12n4p  . mometasone-formoterol  2 puff Inhalation BID  . pantoprazole  40 mg Oral Daily  . predniSONE  50 mg Oral Q breakfast  . tiotropium  18 mcg Inhalation Daily      Allergies:  Allergies  Allergen Reactions  . Nitrofurantoin Monohyd Macro Shortness Of Breath  . Levaquin [Levofloxacin] Rash  . Adhesive [Tape] Other (See Comments)    "pull skin off"  . Codeine Itching  . Crestor [Rosuvastatin] Other (See Comments)    Reaction:  Dizziness   . Duratuss G [Guaifenesin] Nausea And Vomiting  . Oxycodone-Acetaminophen Itching  . Penicillins Other (See Comments)    Unsure   . Propoxyphene Itching  . Tramadol Itching  . Morphine Itching and Rash    Social History   Social History  . Marital status: Widowed    Spouse name: N/A  . Number of children: N/A  . Years of education: N/A   Occupational History  . Not on file.   Social History Main Topics  . Smoking status: Former Smoker    Packs/day: 0.50    Types: Cigarettes  . Smokeless tobacco: Never Used     Comment: quit 11 years ago  . Alcohol use No     Comment: 2 oz of wine at night  . Drug use: No  . Sexual activity: Not on file   Other Topics Concern  . Not on file   Social History Narrative  . No narrative on file     Family History  Problem Relation Age of Onset  . Heart disease Brother   . Cancer Brother   . COPD Sister   . Diabetes Sister   . Hypertension Brother   . Diabetes Brother      Review of Systems Positive for Shortness of breath weakness and fatigue Negative for: General:  chills, fever, night sweats or weight changes.  Cardiovascular: PND orthopnea syncope dizziness  Dermatological skin  lesions rashes Respiratory: Cough congestion Urologic: Frequent urination urination at night and hematuria Abdominal: negative for nausea, vomiting, diarrhea, bright red blood per rectum, melena, or hematemesis Neurologic:  negative for visual changes, and/or hearing changes  All other systems reviewed and are otherwise negative except as noted above.  Labs:  Recent Labs  11/14/15 2124  TROPONINI 0.18*   Lab Results  Component Value Date   WBC 7.1 11/15/2015   HGB 12.0 11/15/2015   HCT 36.5 11/15/2015   MCV 96.3 11/15/2015   PLT 205 11/15/2015    Recent Labs Lab 11/15/15 0447  NA 138  K 4.0  CL 99*  CO2 31  BUN 27*  CREATININE 1.30*  CALCIUM 9.0  GLUCOSE 163*   Lab Results  Component Value Date   CHOL 308 (H) 11/15/2014   HDL 69 11/15/2014   LDLCALC 208 (H) 11/15/2014   TRIG 157 (H) 11/15/2014   No results found for: DDIMER  Radiology/Studies:  Dg Chest Port 1 View  Result Date: 11/15/2015 CLINICAL DATA:  Acute respiratory failure. EXAM: PORTABLE CHEST 1 VIEW COMPARISON:  11/14/2015 FINDINGS: Normal heart size and pulmonary vascularity. Emphysematous changes in the lungs with streaky perihilar opacities and interstitial changes likely representing chronic bronchitis. Scattered fibrosis. No pleural effusions. No pneumothorax. Previous resection or resorption of the distal right clavicle. Calcification of the aorta. IMPRESSION: Emphysematous changes and chronic bronchitic changes in the lungs. No evidence of active pulmonary disease. Electronically Signed   By: Burman NievesWilliam  Stevens M.D.   On: 11/15/2015 05:57   Dg Chest Portable 1 View  Result Date: 11/14/2015 CLINICAL DATA:  Shortness of breath. Difficulty breathing for 4 hours. Decreased oxygenation. History of COPD on home oxygen. Wheezing. EXAM: PORTABLE CHEST 1 VIEW COMPARISON:  07/03/2015 FINDINGS: Emphysematous changes and scattered fibrosis in the lungs. No focal airspace disease or consolidation. Normal heart size  and pulmonary vascularity. No pleural effusions or pneumothorax. No significant change since prior study. IMPRESSION: Emphysematous changes and fibrosis in the lungs. No evidence of active pulmonary disease. Electronically Signed   By: Burman NievesWilliam  Stevens M.D.   On: 11/14/2015 22:26    EKG: Normal sinus rhythm  Weights: Filed Weights   11/15/15 0252 11/16/15 0351 11/17/15 0400  Weight: 62.3 kg (137 lb 5.6 oz) 62.2 kg (137 lb 2 oz) 62 kg (136 lb 11 oz)     Physical Exam: Blood pressure 133/62, pulse 68, temperature 98.2 F (36.8 C), temperature source Oral, resp. rate 18, height 5\' 5"  (1.651 m), weight 62 kg (136 lb 11 oz), SpO2 97 %. Body mass index is 22.75 kg/m. General: Well developed, well nourished, in no acute distress. Head eyes ears nose throat: Normocephalic, atraumatic, sclera non-icteric, no xanthomas, nares are without discharge. No apparent thyromegaly and/or mass  Lungs: Normal respiratory effort.  Some wheezes, no rales, few rhonchi.  Heart: RRR with normal S1 S2. no murmur gallop, no rub, PMI is normal size and placement, carotid upstroke normal without bruit, jugular venous pressure is normal Abdomen: Soft, non-tender, non-distended with normoactive bowel sounds. No hepatomegaly. No rebound/guarding. No obvious abdominal masses. Abdominal aorta is normal size without bruit Extremities: Trace edema. no cyanosis, no clubbing, no ulcers  Peripheral : 2+ bilateral upper extremity pulses, 2+ bilateral femoral pulses, 2+ bilateral dorsal pedal pulse Neuro: Alert and oriented. No facial asymmetry. No focal deficit. Moves all extremities spontaneously. Musculoskeletal: Normal muscle tone with  kyphosis Psych:  Responds to questions appropriately with a normal affect.    Assessment: 82 year old female with peripheral vascular disease abdominal aortic aneurysm essential hypertension mixed hyperlipidemia and diabetes on appropriate medication management for cardiovascular risk  reduction status post acute on chronic respiratory failure slowly  improving  Plan: 1. Continue antiplatelet medication management for peripheral vascular disease 2. Consider high intensity cholesterol therapy for peripheral vascular disease 3. Abstain from carvedilol at this time until blood pressure improves with rehabilitation and reinstate possibly as an outpatient 4. Supportive care of respiratory failure 5. Ears modified for lower extremity edema 6. Begin ambulation and follow for need for adjustments of medication management and no further cardiac diagnostics necessary at this time  Signed, Lamar Blinks M.D. [facility] Cardiology 11/17/2015, 8:46 AM

## 2015-11-19 LAB — CULTURE, BLOOD (ROUTINE X 2)
CULTURE: NO GROWTH
Culture: NO GROWTH

## 2015-11-28 ENCOUNTER — Ambulatory Visit (INDEPENDENT_AMBULATORY_CARE_PROVIDER_SITE_OTHER): Payer: Commercial Managed Care - HMO | Admitting: Family Medicine

## 2015-11-28 ENCOUNTER — Encounter: Payer: Self-pay | Admitting: Family Medicine

## 2015-11-28 VITALS — BP 126/76 | HR 81 | Temp 98.2°F | Resp 18 | Wt 137.8 lb

## 2015-11-28 DIAGNOSIS — J9601 Acute respiratory failure with hypoxia: Secondary | ICD-10-CM

## 2015-11-28 DIAGNOSIS — Z7952 Long term (current) use of systemic steroids: Secondary | ICD-10-CM

## 2015-11-28 DIAGNOSIS — J432 Centrilobular emphysema: Secondary | ICD-10-CM

## 2015-11-28 DIAGNOSIS — G479 Sleep disorder, unspecified: Secondary | ICD-10-CM

## 2015-11-28 NOTE — Progress Notes (Signed)
Patient: Cynthia [name] Female    DOB: 1926/01/09   82 y.o.   MRN: [id number] Visit Date: 11/28/2015  Today's Provider: Dortha Kernennis Chrismon, PA   Chief Complaint  Patient presents with  . Hospitalization Follow-up   Subjective:    HPI  Follow up Hospitalization  Patient was [facility] on 11/14/2015 and discharged on 11/17/2015. She was treated for Respiratory distress, acute renal insufficiency, COPD exacerbation, muscle weakness, and NSTEMI. Treatment for this included started Azithromycin. Stopped Aspirin, cholecalciferol, ferrous sulfate, and fish oil. . She reports excellent compliance with treatment. She reports this condition is Improved.  ------------------------------------------------------------------------------------  Past Medical History:  Diagnosis Date  . Abdominal pain, epigastric   . Allergic drug reaction   . Aneurysm of aorta (HCC)   . Asthma   . Breathlessness on exertion   . CAD (coronary artery disease)   . COPD (chronic obstructive pulmonary disease) (HCC)   . GERD (gastroesophageal reflux disease)   . History of partial colectomy   . Hypercholesteremia   . Hypertension   . Hypothyroidism   . Hypoxia   . Insomnia   . Nocturia   . Olecranon bursitis   . Osteoarthritis   . Osteoporosis   . Pneumonia   . Renal failure   . Seborrheic keratoses   . Spinal stenosis of lumbar region without neurogenic claudication   . Stomatitis monilial   . Stroke (HCC)   . UTI symptoms   . Vaginal atrophy    Past Surgical History:  Procedure Laterality Date  . APPENDECTOMY    . APPLICATION OF WOUND VAC Right 05/26/2015   Procedure: APPLICATION OF WOUND VAC;  Surgeon: Annice NeedyJason S Dew, MD;  Location: [facility] ORS;  Service: Vascular;  Laterality: Right;  . BACK SURGERY    . COLON SURGERY     Hole in colon that was repaired  . EYE SURGERY Bilateral    Cataract extraction with IOL  . Multilevel spinal fusion and laminectomy  2010   Due to spinal stenosis Dr. Gerrit Heckaliff   . PERIPHERAL VASCULAR CATHETERIZATION N/A 03/16/2015   Procedure: Abdominal Aortogram w/Lower Extremity;  Surgeon: Renford DillsGregory G Schnier, MD;  Location: [facility] INVASIVE CV LAB;  Service: Cardiovascular;  Laterality: N/A;  . PERIPHERAL VASCULAR CATHETERIZATION  03/16/2015   Procedure: Lower Extremity Intervention;  Surgeon: Renford DillsGregory G Schnier, MD;  Location: [facility] INVASIVE CV LAB;  Service: Cardiovascular;;  . PERIPHERAL VASCULAR CATHETERIZATION Bilateral 04/20/2015   Procedure: Endovascular Repair/Stent Graft;  Surgeon: Renford DillsGregory G Schnier, MD;  Location: [facility] INVASIVE CV LAB;  Service: Cardiovascular;  Laterality: Bilateral;  . Rotator cuff surgery  2007  . VAGINAL HYSTERECTOMY  1960  . WOUND DEBRIDEMENT Right 05/26/2015   Procedure: DEBRIDEMENT WOUND ( RIGHT GROIN DEBRIDEMENT );  Surgeon: Annice NeedyJason S Dew, MD;  Location: [facility] ORS;  Service: Vascular;  Laterality: Right;  . WRIST SURGERY     lump removed due to pain   Family History  Problem Relation Age of Onset  . Heart disease Brother   . Cancer Brother   . COPD Sister   . Diabetes Sister   . Hypertension Brother   . Diabetes Brother    Allergies  Allergen Reactions  . Nitrofurantoin Monohyd Macro Shortness Of Breath  . Levaquin [Levofloxacin] Rash  . Adhesive [Tape] Other (See Comments)    "pull skin off"  . Codeine Itching  . Crestor [Rosuvastatin] Other (See Comments)    Reaction:  Dizziness   . Duratuss G [Guaifenesin] Nausea And Vomiting  .  Oxycodone-Acetaminophen Itching  . Penicillins Other (See Comments)    Unsure   . Propoxyphene Itching  . Tramadol Itching  . Morphine Itching and Rash     Previous Medications   ACETAMINOPHEN (TYLENOL) 325 MG TABLET    Take 1-2 tablets (325-650 mg total) by mouth every 4 (four) hours as needed for mild pain (or temp >/= 101 F).   ALBUTEROL (PROVENTIL HFA;VENTOLIN HFA) 108 (90 BASE) MCG/ACT INHALER    Inhale 2 puffs into the lungs every 6 (six) hours as needed for wheezing or shortness of breath.     ALBUTEROL (PROVENTIL) (2.5 MG/3ML) 0.083% NEBULIZER SOLUTION    Take 2.5 mg by nebulization every 6 (six) hours as needed for wheezing or shortness of breath. Pt mixes with her Atrovent nebulizer solution.   AZITHROMYCIN (ZITHROMAX) 250 MG TABLET    Take as directed   BENZONATATE (TESSALON) 100 MG CAPSULE    Take 2 capsules (200 mg total) by mouth 3 (three) times daily as needed for cough.   CALCIUM CARB-CHOLECALCIFEROL (CALCIUM 600+D3) 600-800 MG-UNIT TABS    Take 1 tablet by mouth daily.   CARVEDILOL (COREG) 3.125 MG TABLET    Take 3.125 mg by mouth 2 (two) times daily with a meal.   CLOPIDOGREL (PLAVIX) 75 MG TABLET    Take 1 tablet (75 mg total) by mouth daily.   DEXTROMETHORPHAN-GUAIFENESIN (MUCINEX DM) 30-600 MG 12HR TABLET    Take 1 tablet by mouth 2 (two) times daily as needed.    DICLOFENAC SODIUM (VOLTAREN) 1 % GEL    Apply 2 g topically 4 (four) times daily as needed (for pain). Reported on 07/15/2015   FEXOFENADINE (ALLEGRA) 180 MG TABLET    Take 180 mg by mouth daily.   FLUTICASONE FUROATE-VILANTEROL (BREO ELLIPTA) 200-25 MCG/INH AEPB    Inhale 1 puff into the lungs daily.   FUROSEMIDE (LASIX) 20 MG TABLET    Take 20 mg by mouth daily.   IPRATROPIUM (ATROVENT) 0.02 % NEBULIZER SOLUTION    Take 2.5 mLs (0.5 mg total) by nebulization every 6 (six) hours as needed for wheezing or shortness of breath. Pt mixes with her albuterol nebulizer solution.   LANSOPRAZOLE (PREVACID) 30 MG CAPSULE    Take 1 capsule (30 mg total) by mouth daily.   LEVOTHYROXINE (SYNTHROID, LEVOTHROID) 75 MCG TABLET    Take 75 mcg by mouth daily before breakfast.   MAGNESIUM OXIDE 250 MG TABS    Take 250 mg by mouth 2 (two) times daily.    NITROGLYCERIN (NITROSTAT) 0.4 MG SL TABLET    Place 0.4 mg under the tongue every 5 (five) minutes as needed for chest pain. Reported on 06/14/2015   POTASSIUM GLUCONATE 595 MG CAPS    Take 595 mg by mouth every morning. Reported on 07/15/2015   PREDNISONE (DELTASONE) 10 MG TABLET     Take 5 tablets (50 mg total) by mouth daily with breakfast. Start 50 mg and thenTaper by 10 mg daily and thereafter resume our 5 mg daily (chronic)   PREDNISONE (DELTASONE) 5 MG TABLET    Take 5 mg by mouth daily with breakfast.   TIOTROPIUM BROMIDE MONOHYDRATE (SPIRIVA RESPIMAT) 2.5 MCG/ACT AERS    Inhale 1 puff into the lungs daily at 8 pm.   VITAMIN C (ASCORBIC ACID) 500 MG TABLET    Take 500 mg by mouth daily. Reported on 09/02/2015    Review of Systems  Constitutional: Positive for fatigue.  Respiratory: Positive for shortness of breath.  Cardiovascular: Negative.   Psychiatric/Behavioral: Positive for sleep disturbance.    Social History  Substance Use Topics  . Smoking status: Former Smoker    Packs/day: 0.50    Types: Cigarettes  . Smokeless tobacco: Never Used     Comment: quit 11 years ago  . Alcohol use No     Comment: 2 oz of wine at night   Objective:   BP 126/76 (BP Location: Right Arm, Patient Position: Sitting, Cuff Size: Normal)   Pulse 81   Temp 98.2 F (36.8 C) (Oral)   Resp 18   Wt 137 lb 12.8 oz (62.5 kg)   SpO2 93%   BMI 22.93 kg/m  Wt Readings from Last 3 Encounters:  11/28/15 137 lb 12.8 oz (62.5 kg)  11/17/15 136 lb 11 oz (62 kg)  09/02/15 130 lb (59 kg)    Physical Exam  Constitutional: She appears well-developed and well-nourished.  HENT:  Head: Normocephalic.  Right Ear: External ear normal.  Left Ear: External ear normal.  Mouth/Throat: Oropharynx is clear and moist.  Eyes: Conjunctivae are normal.  Neck: Neck supple.  Cardiovascular: Normal rate and regular rhythm.   Pulmonary/Chest: She is in respiratory distress. She has wheezes.  No rales or rhonchi. Poor air exchange. Very distant breath sounds.  Abdominal: Soft. Bowel sounds are normal.  Lymphadenopathy:    She has no cervical adenopathy.  Skin:  Multiple bruises on lower legs/shins.      Assessment & Plan:     1. Acute respiratory failure with hypoxia (HCC) Improved back  to baseline but still needs continuous oxygen, chronic prednisone, albuterol, Breo and Spiriva. Still having labored breathing. CXR confirms emphysema with scattered fibrosis in lungs during hospitalization 11-14-15 to 10-5--17. Continue present regimen. Pulse oximetry 93% on 2 LPM oxygen today.  2. Centriacinar emphysema (HCC) Unchanged from x-rays done in the hospital.   3. Long term systemic steroid user Continued due to acute on chronic hypoxia and respiratory failure with COPD/emphysema. Will get BMD. - DG Bone Density  4. Sleep disturbance Difficulty sleeping with respiratory failure (acute on chronic respiratory failure.

## 2015-12-03 DIAGNOSIS — J449 Chronic obstructive pulmonary disease, unspecified: Secondary | ICD-10-CM | POA: Diagnosis not present

## 2015-12-03 DIAGNOSIS — M48061 Spinal stenosis, lumbar region without neurogenic claudication: Secondary | ICD-10-CM | POA: Diagnosis not present

## 2015-12-05 DIAGNOSIS — J449 Chronic obstructive pulmonary disease, unspecified: Secondary | ICD-10-CM | POA: Diagnosis not present

## 2015-12-15 ENCOUNTER — Ambulatory Visit (INDEPENDENT_AMBULATORY_CARE_PROVIDER_SITE_OTHER): Payer: Commercial Managed Care - HMO | Admitting: Family Medicine

## 2015-12-15 VITALS — BP 123/70 | HR 88 | Temp 97.9°F | Ht 60.0 in | Wt 138.0 lb

## 2015-12-15 DIAGNOSIS — Z0000 Encounter for general adult medical examination without abnormal findings: Secondary | ICD-10-CM | POA: Diagnosis not present

## 2015-12-15 NOTE — Patient Instructions (Signed)
Cynthia [name] , Thank you for taking time to come for your Medicare Wellness Visit. I appreciate your ongoing commitment to your health goals. Please review the following plan we discussed and let me know if I can assist you in the future.   These are the goals we discussed: Goals    None      This is a list of the screening recommended for you and due dates:  Health Maintenance  Topic Date Due  . Pneumonia vaccines (1 of 2 - PCV13) 01/27/1993  . Shingles Vaccine  12/14/2016*  . Tetanus Vaccine  12/14/2016*  . DEXA scan (bone density measurement)  02/11/2017*  . Flu Shot  Completed  *Topic was postponed. The date shown is not the original due date.   Preventive Care for Adults  A healthy lifestyle and preventive care can promote health and wellness. Preventive health guidelines for adults include the following key practices.  . A routine yearly physical is a good way to check with your health care provider about your health and preventive screening. It is a chance to share any concerns and updates on your health and to receive a thorough exam.  . Visit your dentist for a routine exam and preventive care every 6 months. Brush your teeth twice a day and floss once a day. Good oral hygiene prevents tooth decay and gum disease.  . The frequency of eye exams is based on your age, health, family medical history, use  of contact lenses, and other factors. Follow your health care provider's ecommendations for frequency of eye exams.  . Eat a healthy diet. Foods like vegetables, fruits, whole grains, low-fat dairy products, and lean protein foods contain the nutrients you need without too many calories. Decrease your intake of foods high in solid fats, added sugars, and salt. Eat the right amount of calories for you. Get information about a proper diet from your health care provider, if necessary.  . Regular physical exercise is one of the most important things you can do for your health. Most  adults should get at least 150 minutes of moderate-intensity exercise (any activity that increases your heart rate and causes you to sweat) each week. In addition, most adults need muscle-strengthening exercises on 2 or more days a week.  Silver Sneakers may be a benefit available to you. To determine eligibility, you may visit the website: www.silversneakers.com or contact program at [phone] Mon-Fri between 8AM-8PM.   . Maintain a healthy weight. The body mass index (BMI) is a screening tool to identify possible weight problems. It provides an estimate of body fat based on height and weight. Your health care provider can find your BMI and can help you achieve or maintain a healthy weight.   For adults 20 years and older: ? A BMI below 18.5 is considered underweight. ? A BMI of 18.5 to 24.9 is normal. ? A BMI of 25 to 29.9 is considered overweight. ? A BMI of 30 and above is considered obese.   . Maintain normal blood lipids and cholesterol levels by exercising and minimizing your intake of saturated fat. Eat a balanced diet with plenty of fruit and vegetables. Blood tests for lipids and cholesterol should begin at age 80 and be repeated every 5 years. If your lipid or cholesterol levels are high, you are over 50, or you are at high risk for heart disease, you may need your cholesterol levels checked more frequently. Ongoing high lipid and cholesterol levels should be treated with  medicines if diet and exercise are not working.  . If you smoke, find out from your health care provider how to quit. If you do not use tobacco, please do not start.  . If you choose to drink alcohol, please do not consume more than 2 drinks per day. One drink is considered to be 12 ounces (355 mL) of beer, 5 ounces (148 mL) of wine, or 1.5 ounces (44 mL) of liquor.  . If you are 41-14 years old, ask your health care provider if you should take aspirin to prevent strokes.  . Use sunscreen. Apply sunscreen  liberally and repeatedly throughout the day. You should seek shade when your shadow is shorter than you. Protect yourself by wearing long sleeves, pants, a wide-brimmed hat, and sunglasses year round, whenever you are outdoors.  . Once a month, do a whole body skin exam, using a mirror to look at the skin on your back. Tell your health care provider of new moles, moles that have irregular borders, moles that are larger than a pencil eraser, or moles that have changed in shape or color.

## 2015-12-15 NOTE — Progress Notes (Signed)
Subjective:   Cynthia [name] is a 82 y.o. female who presents for Medicare Annual (Subsequent) preventive examination.  Review of Systems:  N/A Cardiac Risk Factors include: advanced age (>1055men, 42>65 women);dyslipidemia;hypertension     Objective:     Vitals: BP 123/70 (BP Location: Left Arm)   Pulse 88   Temp 97.9 F (36.6 C) (Oral)   Ht 5' (1.524 m)   Wt 138 lb (62.6 kg)   BMI 26.95 kg/m   Body mass index is 26.95 kg/m.   Tobacco History  Smoking Status  . Former Smoker  . Packs/day: 0.50  . Types: Cigarettes  Smokeless Tobacco  . Never Used    Comment: quit 11 years ago     Counseling given: Not Answered   Past Medical History:  Diagnosis Date  . Abdominal pain, epigastric   . Allergic drug reaction   . Aneurysm of aorta (HCC)   . Asthma   . Breathlessness on exertion   . CAD (coronary artery disease)   . COPD (chronic obstructive pulmonary disease) (HCC)   . GERD (gastroesophageal reflux disease)   . History of partial colectomy   . Hypercholesteremia   . Hypertension   . Hypothyroidism   . Hypoxia   . Insomnia   . Nocturia   . Olecranon bursitis   . Osteoarthritis   . Osteoporosis   . Pneumonia   . Renal failure   . Seborrheic keratoses   . Spinal stenosis of lumbar region without neurogenic claudication   . Stomatitis monilial   . Stroke (HCC)   . UTI symptoms   . Vaginal atrophy    Past Surgical History:  Procedure Laterality Date  . APPENDECTOMY    . APPLICATION OF WOUND VAC Right 05/26/2015   Procedure: APPLICATION OF WOUND VAC;  Surgeon: Annice NeedyJason S Dew, MD;  Location: [facility] ORS;  Service: Vascular;  Laterality: Right;  . BACK SURGERY    . COLON SURGERY     Hole in colon that was repaired  . EYE SURGERY Bilateral    Cataract extraction with IOL  . Multilevel spinal fusion and laminectomy  2010   Due to spinal stenosis Dr. Gerrit Heckaliff  . PERIPHERAL VASCULAR CATHETERIZATION N/A 03/16/2015   Procedure: Abdominal Aortogram w/Lower Extremity;   Surgeon: Renford DillsGregory G Schnier, MD;  Location: [facility];  Service: Cardiovascular;  Laterality: N/A;  . PERIPHERAL VASCULAR CATHETERIZATION  03/16/2015   Procedure: Lower Extremity Intervention;  Surgeon: Renford DillsGregory G Schnier, MD;  Location: [facility];  Service: Cardiovascular;;  . PERIPHERAL VASCULAR CATHETERIZATION Bilateral 04/20/2015   Procedure: Endovascular Repair/Stent Graft;  Surgeon: Renford DillsGregory G Schnier, MD;  Location: [facility];  Service: Cardiovascular;  Laterality: Bilateral;  . Rotator cuff surgery  2007  . VAGINAL HYSTERECTOMY  1960  . WOUND DEBRIDEMENT Right 05/26/2015   Procedure: DEBRIDEMENT WOUND ( RIGHT GROIN DEBRIDEMENT );  Surgeon: Annice NeedyJason S Dew, MD;  Location: [facility] OR[facility];  Service: Vascular;  Laterality: Right;  . WRIST SURGERY     lump removed due to pain   Family History  Problem Relation Age of Onset  . Heart disease Brother   . Cancer Brother   . COPD Sister   . Diabetes Sister   . Hypertension Brother   . Diabetes Brother    History  Sexual Activity  . Sexual activity: Not on file    Outpatient Encounter Prescriptions as of 12/15/2015  Medication Sig  . acetaminophen (TYLENOL) 325 MG tablet Take 1-2 tablets (  325-650 mg total) by mouth every 4 (four) hours as needed for mild pain (or temp >/= 101 F).  Marland Kitchen albuterol (PROVENTIL HFA;VENTOLIN HFA) 108 (90 Base) MCG/ACT inhaler Inhale 2 puffs into the lungs every 6 (six) hours as needed for wheezing or shortness of breath.  Marland Kitchen albuterol (PROVENTIL) (2.5 MG/3ML) 0.083% nebulizer solution Take 2.5 mg by nebulization every 6 (six) hours as needed for wheezing or shortness of breath. Pt mixes with her Atrovent nebulizer solution.  Marland Kitchen azithromycin (ZITHROMAX) 250 MG tablet Take as directed  . benzonatate (TESSALON) 100 MG capsule Take 2 capsules (200 mg total) by mouth 3 (three) times daily as needed for cough.  . Calcium Carb-Cholecalciferol (CALCIUM 600+D3) 600-800 MG-UNIT TABS Take 1 tablet by mouth  daily.  . carvedilol (COREG) 3.125 MG tablet Take 3.125 mg by mouth 2 (two) times daily with a meal.  . clopidogrel (PLAVIX) 75 MG tablet Take 1 tablet (75 mg total) by mouth daily.  Marland Kitchen dextromethorphan-guaiFENesin (MUCINEX DM) 30-600 MG 12hr tablet Take 1 tablet by mouth 2 (two) times daily as needed.   . diclofenac sodium (VOLTAREN) 1 % GEL Apply 2 g topically 4 (four) times daily as needed (for pain). Reported on 07/15/2015  . fexofenadine (ALLEGRA) 180 MG tablet Take 180 mg by mouth daily.  . fluticasone furoate-vilanterol (BREO ELLIPTA) 200-25 MCG/INH AEPB Inhale 1 puff into the lungs daily.  . furosemide (LASIX) 20 MG tablet Take 20 mg by mouth daily.  Marland Kitchen ipratropium (ATROVENT) 0.02 % nebulizer solution Take 2.5 mLs (0.5 mg total) by nebulization every 6 (six) hours as needed for wheezing or shortness of breath. Pt mixes with her albuterol nebulizer solution.  . lansoprazole (PREVACID) 30 MG capsule Take 1 capsule (30 mg total) by mouth daily.  Marland Kitchen levothyroxine (SYNTHROID, LEVOTHROID) 75 MCG tablet Take 75 mcg by mouth daily before breakfast.  . Magnesium Oxide 250 MG TABS Take 250 mg by mouth 2 (two) times daily.   . nitroGLYCERIN (NITROSTAT) 0.4 MG SL tablet Place 0.4 mg under the tongue every 5 (five) minutes as needed for chest pain. Reported on 06/14/2015  . Potassium Gluconate 595 MG CAPS Take 595 mg by mouth every morning. Reported on 07/15/2015  . predniSONE (DELTASONE) 10 MG tablet Take 5 tablets (50 mg total) by mouth daily with breakfast. Start 50 mg and thenTaper by 10 mg daily and thereafter resume our 5 mg daily (chronic)  . predniSONE (DELTASONE) 5 MG tablet Take 5 mg by mouth daily with breakfast.  . Tiotropium Bromide Monohydrate (SPIRIVA RESPIMAT) 2.5 MCG/ACT AERS Inhale 1 puff into the lungs daily at 8 pm.  . vitamin C (ASCORBIC ACID) 500 MG tablet Take 500 mg by mouth daily. Reported on 09/02/2015   No facility-administered encounter medications on file as of 12/15/2015.      Activities of Daily Living In your present state of health, do you have any difficulty performing the following activities: 12/15/2015 12/15/2015  Hearing? Malvin Johns  Vision? Y Y  Difficulty concentrating or making decisions? N -  Walking or climbing stairs? Y -  Dressing or bathing? Y -  Doing errands, shopping? Malvin Johns  Preparing Food and eating ? N -  Using the Toilet? N -  In the past six months, have you accidently leaked urine? Y -  Do you have problems with loss of bowel control? N -  Managing your Medications? Y -  Managing your Finances? Y -  Housekeeping or managing your Housekeeping? Y -  Some recent  data might be hidden    Patient Care Team: Malva Limesonald E Fisher, MD as PCP - General (Family Medicine) Tamsen Roersennis E Chrismon, PA as Consulting Physician (Family Medicine) Yevonne PaxSaadat A Khan, MD as Consulting Physician (Internal Medicine) Lamar BlinksBruce J Kowalski, MD as Consulting Physician (Cardiology)    Assessment:     Exercise Activities and Dietary recommendations Current Exercise Habits: Home exercise routine, Type of exercise: Other - see comments;strength training/weights (bicycle), Time (Minutes): 30, Frequency (Times/Week): 7, Weekly Exercise (Minutes/Week): 210, Intensity: Mild  Goals    None     Fall Risk Fall Risk  12/15/2015 06/14/2015 02/18/2015 02/10/2015  Falls in the past year? No No No No  Risk for fall due to : - Impaired mobility Impaired mobility;Impaired vision Impaired vision   Depression Screen PHQ 2/9 Scores 12/15/2015 06/14/2015 02/18/2015 02/10/2015  PHQ - 2 Score 0 1 0 0     Cognitive Function     6CIT Screen 12/15/2015  What Year? 0 points  What month? 0 points  What time? 0 points  Count back from 20 0 points  Months in reverse 4 points  Repeat phrase 2 points  Total Score 6    Immunization History  Administered Date(s) Administered  . Influenza, High Dose Seasonal PF 11/15/2014  . Influenza-Unspecified 10/13/2013   Screening Tests Health Maintenance   Topic Date Due  . ZOSTAVAX  01/28/1988  . DEXA SCAN  01/27/1993  . PNA vac Low Risk Adult (1 of 2 - PCV13) 01/27/1993  . TETANUS/TDAP  12/14/2016 (Originally 01/28/1945)  . INFLUENZA VACCINE  Completed      Plan:  I have personally reviewed and addressed the Medicare Annual Wellness questionnaire and have noted the following in the patient's chart:  A. Medical and social history B. Use of alcohol, tobacco or illicit drugs  C. Current medications and supplements D. Functional ability and status E.  Nutritional status F.  Physical activity G. Advance directives H. List of other physicians I.  Hospitalizations, surgeries, and ER visits in previous 12 months J.  Vitals K. Screenings such as hearing and vision if needed, cognitive and depression L. Referrals and appointments - none  In addition, I have reviewed and discussed with patient certain preventive protocols, quality metrics, and best practice recommendations. A written personalized care plan for preventive services as well as general preventive health recommendations were provided to patient.  See attached scanned questionnaire for additional information.   Signed,  Hyacinth MeekerMckenzie Lashina Milles, LPN Nurse Health Advisor  I have reviewed the health advisor's note, was available for consultation, and agree with documentation and plan  Mila Merryonald Fisher, MD

## 2016-01-03 DIAGNOSIS — M48061 Spinal stenosis, lumbar region without neurogenic claudication: Secondary | ICD-10-CM | POA: Diagnosis not present

## 2016-01-03 DIAGNOSIS — J449 Chronic obstructive pulmonary disease, unspecified: Secondary | ICD-10-CM | POA: Diagnosis not present

## 2016-01-05 DIAGNOSIS — J449 Chronic obstructive pulmonary disease, unspecified: Secondary | ICD-10-CM | POA: Diagnosis not present

## 2016-01-23 DIAGNOSIS — J449 Chronic obstructive pulmonary disease, unspecified: Secondary | ICD-10-CM | POA: Diagnosis not present

## 2016-01-23 DIAGNOSIS — J9611 Chronic respiratory failure with hypoxia: Secondary | ICD-10-CM | POA: Diagnosis not present

## 2016-01-23 DIAGNOSIS — Z9981 Dependence on supplemental oxygen: Secondary | ICD-10-CM | POA: Diagnosis not present

## 2016-01-23 DIAGNOSIS — F064 Anxiety disorder due to known physiological condition: Secondary | ICD-10-CM | POA: Diagnosis not present

## 2016-01-23 DIAGNOSIS — R0602 Shortness of breath: Secondary | ICD-10-CM | POA: Diagnosis not present

## 2016-02-02 DIAGNOSIS — J449 Chronic obstructive pulmonary disease, unspecified: Secondary | ICD-10-CM | POA: Diagnosis not present

## 2016-02-04 DIAGNOSIS — J449 Chronic obstructive pulmonary disease, unspecified: Secondary | ICD-10-CM | POA: Diagnosis not present

## 2016-02-08 DIAGNOSIS — R0602 Shortness of breath: Secondary | ICD-10-CM | POA: Diagnosis not present

## 2016-02-21 DIAGNOSIS — J9611 Chronic respiratory failure with hypoxia: Secondary | ICD-10-CM | POA: Diagnosis not present

## 2016-02-21 DIAGNOSIS — R0602 Shortness of breath: Secondary | ICD-10-CM | POA: Diagnosis not present

## 2016-02-21 DIAGNOSIS — F064 Anxiety disorder due to known physiological condition: Secondary | ICD-10-CM | POA: Diagnosis not present

## 2016-02-21 DIAGNOSIS — J449 Chronic obstructive pulmonary disease, unspecified: Secondary | ICD-10-CM | POA: Diagnosis not present

## 2016-02-27 ENCOUNTER — Emergency Department

## 2016-02-27 ENCOUNTER — Emergency Department
Admission: EM | Admit: 2016-02-27 | Discharge: 2016-02-27 | Disposition: A | Discharge status: Home / Self Care | Attending: Emergency Medicine | Admitting: Emergency Medicine

## 2016-02-27 ENCOUNTER — Encounter: Payer: Self-pay | Admitting: Emergency Medicine

## 2016-02-27 DIAGNOSIS — J9601 Acute respiratory failure with hypoxia: Secondary | ICD-10-CM

## 2016-02-27 DIAGNOSIS — I2510 Atherosclerotic heart disease of native coronary artery without angina pectoris: Secondary | ICD-10-CM | POA: Insufficient documentation

## 2016-02-27 DIAGNOSIS — E039 Hypothyroidism, unspecified: Secondary | ICD-10-CM | POA: Diagnosis not present

## 2016-02-27 DIAGNOSIS — I10 Essential (primary) hypertension: Secondary | ICD-10-CM | POA: Diagnosis not present

## 2016-02-27 DIAGNOSIS — J441 Chronic obstructive pulmonary disease with (acute) exacerbation: Secondary | ICD-10-CM

## 2016-02-27 DIAGNOSIS — R0600 Dyspnea, unspecified: Secondary | ICD-10-CM | POA: Diagnosis not present

## 2016-02-27 DIAGNOSIS — Z515 Encounter for palliative care: Secondary | ICD-10-CM | POA: Diagnosis not present

## 2016-02-27 DIAGNOSIS — J45909 Unspecified asthma, uncomplicated: Secondary | ICD-10-CM | POA: Insufficient documentation

## 2016-02-27 DIAGNOSIS — Z9989 Dependence on other enabling machines and devices: Secondary | ICD-10-CM | POA: Diagnosis not present

## 2016-02-27 DIAGNOSIS — Z87891 Personal history of nicotine dependence: Secondary | ICD-10-CM | POA: Insufficient documentation

## 2016-02-27 DIAGNOSIS — J9600 Acute respiratory failure, unspecified whether with hypoxia or hypercapnia: Secondary | ICD-10-CM | POA: Diagnosis not present

## 2016-02-27 DIAGNOSIS — J209 Acute bronchitis, unspecified: Secondary | ICD-10-CM

## 2016-02-27 DIAGNOSIS — R7989 Other specified abnormal findings of blood chemistry: Secondary | ICD-10-CM

## 2016-02-27 DIAGNOSIS — R778 Other specified abnormalities of plasma proteins: Secondary | ICD-10-CM

## 2016-02-27 DIAGNOSIS — J9602 Acute respiratory failure with hypercapnia: Secondary | ICD-10-CM | POA: Diagnosis not present

## 2016-02-27 DIAGNOSIS — R0603 Acute respiratory distress: Secondary | ICD-10-CM | POA: Diagnosis not present

## 2016-02-27 LAB — COMPREHENSIVE METABOLIC PANEL
ALK PHOS: 57 U/L (ref 38–126)
ALT: 14 U/L (ref 14–54)
ANION GAP: 9 (ref 5–15)
AST: 31 U/L (ref 15–41)
Albumin: 4.2 g/dL (ref 3.5–5.0)
BILIRUBIN TOTAL: 0.5 mg/dL (ref 0.3–1.2)
BUN: 20 mg/dL (ref 6–20)
CALCIUM: 8.6 mg/dL — AB (ref 8.9–10.3)
CO2: 29 mmol/L (ref 22–32)
Chloride: 99 mmol/L — ABNORMAL LOW (ref 101–111)
Creatinine, Ser: 0.94 mg/dL (ref 0.44–1.00)
GFR, EST NON AFRICAN AMERICAN: 53 mL/min — AB (ref >60)
Glucose, Bld: 125 mg/dL — ABNORMAL HIGH (ref 65–99)
Potassium: 4.5 mmol/L (ref 3.5–5.1)
Sodium: 137 mmol/L (ref 135–145)
TOTAL PROTEIN: 7.3 g/dL (ref 6.5–8.1)

## 2016-02-27 LAB — CBC
HEMATOCRIT: 38.9 % (ref 35.0–47.0)
Hemoglobin: 13.2 g/dL (ref 12.0–16.0)
MCH: 32.3 pg (ref 26.0–34.0)
MCHC: 34 g/dL (ref 32.0–36.0)
MCV: 95 fL (ref 80.0–100.0)
Platelets: 238 10*3/uL (ref 150–440)
RBC: 4.1 MIL/uL (ref 3.80–5.20)
RDW: 13 % (ref 11.5–14.5)
WBC: 5.6 10*3/uL (ref 3.6–11.0)

## 2016-02-27 LAB — TROPONIN I: Troponin I: 0.22 ng/mL (ref <0.03)

## 2016-02-27 MED ORDER — IPRATROPIUM-ALBUTEROL 0.5-2.5 (3) MG/3ML IN SOLN
RESPIRATORY_TRACT | Status: AC
  Administered 2016-02-27: 3 mL via RESPIRATORY_TRACT
  Filled 2016-02-27: qty 3

## 2016-02-27 MED ORDER — POLYVINYL ALCOHOL 1.4 % OP SOLN: 1.0000 [drp] | Freq: Four times a day (QID) | OPHTHALMIC | Status: DC | PRN

## 2016-02-27 MED ORDER — MORPHINE SULFATE (PF) 2 MG/ML IV SOLN
2.0000 mg | INTRAVENOUS | Status: DC | PRN
  Administered 2016-02-27: 2 mg via INTRAVENOUS

## 2016-02-27 MED ORDER — ONDANSETRON 4 MG PO TBDP: 4.0000 mg | ORAL_TABLET | Freq: Four times a day (QID) | ORAL | Status: DC | PRN

## 2016-02-27 MED ORDER — BIOTENE DRY MOUTH MT LIQD: 15.0000 mL | OROMUCOSAL | Status: DC | PRN

## 2016-02-27 MED ORDER — LORAZEPAM 2 MG/ML IJ SOLN
INTRAMUSCULAR | Status: AC
  Administered 2016-02-27: 1 mg via INTRAVENOUS
  Filled 2016-02-27: qty 1

## 2016-02-27 MED ORDER — GLYCOPYRROLATE 0.2 MG/ML IJ SOLN: 0.2000 mg | INTRAMUSCULAR | Status: DC | PRN

## 2016-02-27 MED ORDER — MAGNESIUM SULFATE 2 GM/50ML IV SOLN
2.0000 g | Freq: Once | INTRAVENOUS | Status: AC
  Administered 2016-02-27: 2 g via INTRAVENOUS

## 2016-02-27 MED ORDER — LORAZEPAM 2 MG/ML IJ SOLN
1.0000 mg | Freq: Once | INTRAMUSCULAR | Status: AC
  Administered 2016-02-27: 1 mg via INTRAVENOUS
  Filled 2016-02-27: qty 1

## 2016-02-27 MED ORDER — MORPHINE SULFATE (PF) 2 MG/ML IV SOLN: 2.0000 mg | INTRAVENOUS | Status: DC | PRN

## 2016-02-27 MED ORDER — IPRATROPIUM-ALBUTEROL 0.5-2.5 (3) MG/3ML IN SOLN
3.0000 mL | Freq: Once | RESPIRATORY_TRACT | Status: AC
  Administered 2016-02-27: 3 mL via RESPIRATORY_TRACT

## 2016-02-27 MED ORDER — LORAZEPAM 2 MG/ML IJ SOLN
INTRAMUSCULAR | Status: AC
  Filled 2016-02-27: qty 1

## 2016-02-27 MED ORDER — IPRATROPIUM-ALBUTEROL 0.5-2.5 (3) MG/3ML IN SOLN
3.0000 mL | Freq: Once | RESPIRATORY_TRACT | Status: AC
Start: 2016-02-27 — End: 2016-02-27
  Administered 2016-02-27: 3 mL via RESPIRATORY_TRACT

## 2016-02-27 MED ORDER — IPRATROPIUM-ALBUTEROL 0.5-2.5 (3) MG/3ML IN SOLN
RESPIRATORY_TRACT | Status: AC
  Filled 2016-02-27: qty 6

## 2016-02-27 MED ORDER — IPRATROPIUM-ALBUTEROL 0.5-2.5 (3) MG/3ML IN SOLN
3.0000 mL | RESPIRATORY_TRACT | Status: DC
  Administered 2016-02-27: 3 mL via RESPIRATORY_TRACT

## 2016-02-27 MED ORDER — MAGNESIUM SULFATE 2 GM/50ML IV SOLN
INTRAVENOUS | Status: AC
  Filled 2016-02-27: qty 50

## 2016-02-27 MED ORDER — LORAZEPAM 2 MG/ML IJ SOLN
2.0000 mg | Freq: Once | INTRAMUSCULAR | Status: AC
  Administered 2016-02-27: 2 mg via INTRAVENOUS

## 2016-02-27 MED ORDER — LORAZEPAM 2 MG/ML IJ SOLN
1.0000 mg | INTRAMUSCULAR | Status: DC | PRN
Start: 2016-02-27 — End: 2016-02-27
  Administered 2016-02-27: 1 mg via INTRAVENOUS

## 2016-02-27 MED ORDER — DEXTROSE 5 % IV SOLN
500.0000 mg | Freq: Once | INTRAVENOUS | Status: AC
  Administered 2016-02-27: 500 mg via INTRAVENOUS
  Filled 2016-02-27: qty 500

## 2016-02-27 MED ORDER — METHYLPREDNISOLONE SODIUM SUCC 125 MG IJ SOLR
125.0000 mg | Freq: Once | INTRAMUSCULAR | Status: AC
  Administered 2016-02-27: 125 mg via INTRAVENOUS

## 2016-02-27 MED ORDER — MORPHINE SULFATE (PF) 2 MG/ML IV SOLN
2.0000 mg | INTRAVENOUS | Status: DC | PRN
  Administered 2016-02-27: 2 mg via INTRAVENOUS
  Filled 2016-02-27 (×2): qty 1

## 2016-02-27 MED ORDER — SODIUM CHLORIDE 0.9 % IV BOLUS (SEPSIS)
1000.0000 mL | Freq: Once | INTRAVENOUS | Status: AC
  Administered 2016-02-27: 1000 mL via INTRAVENOUS

## 2016-02-27 MED ORDER — IPRATROPIUM-ALBUTEROL 0.5-2.5 (3) MG/3ML IN SOLN
RESPIRATORY_TRACT | Status: AC
  Filled 2016-02-27: qty 3

## 2016-02-27 MED ORDER — ONDANSETRON HCL 4 MG/2ML IJ SOLN: 4.0000 mg | Freq: Four times a day (QID) | INTRAMUSCULAR | Status: DC | PRN

## 2016-02-27 MED ORDER — METHYLPREDNISOLONE SODIUM SUCC 125 MG IJ SOLR
INTRAMUSCULAR | Status: AC
  Filled 2016-02-27: qty 2

## 2016-02-27 NOTE — Care Management Note (Signed)
Case Management Note  Patient Details  Name: Cynthia [name] MRN: [id number] Date of Birth: 1925-06-28  Subjective/Objective:     Call from Dr Luberta Mutterkonidena requesting assistance in placing patient in Hospice. Spoke to nieces at bedside who are very pleasant, and understanding that pulmonary MD suggested they start the Hospice process through the patients PCP last Tuesday. They were unable to get an appt. For the pt. This week. She has since failed at home. They are very agreeable. The intensivist again suggested hospice due to the patients diagnosis of "stage 4 COPD". [facility]. For Hospice as they have expressed the desire to talk to her.MD for the pt. Made aware.               Action/Plan:   Expected Discharge Date:                  Expected Discharge Plan:     In-House Referral:     Discharge planning Services     Post Acute Care Choice:    Choice offered to:     DME Arranged:    DME Agency:     HH Arranged:    HH Agency:     Status of Service:     If discussed at [employer] Length of Stay Meetings, dates discussed:    Additional Comments:  Berna BueCheryl Charod Slawinski, RN 02/27/2016, 8:32 AM

## 2016-02-27 NOTE — ED Notes (Signed)
Critical Troponin 0.22 MD made aware.

## 2016-02-27 NOTE — Consult Note (Signed)
[facility] Pulmonary Medicine Consultation      Assessment and Plan:  Acute hypoxic and hypercapnic respiratory failure likely secondary to AECOPD with acute bronchitis.  -The patient has diffuse bilateral wheezing, consistent with acute exacerbation of COPD secondary to acute bronchitis, suspect that this is likely from viral etiology. -Discussed with nieces who are at the bedside regarding the patient's goals of care. It was noted that the patient has had a progressive decline over the past few months, she was recently recommended to transition to hospice, but they have not yet had time to start the process. -. Interested in transitioning to hospice at this time with the emphasis on comfort measures. -I did discuss with him the possibility of short-term treatment with BiPAP, steroids to see if she will improve and then transition to hospice. Over given her severe claustrophobia. She is very uncomfortable with the mask and would likely require significant amount of sedation, which would not make it difficult for her rest her status to recover.   End Stage emphysema with chronic hypoxic and hypercapnic respiratory failure.  -Chronic respiratory failure with chronic dyspnea on minimal exertion with poor quality of life. -Discussed with family, they opted to transition to comfort measures. -Discussed with hospitalist physician who will look into making arrangements for transition.  Claustrophobia, intolerant of PAP.  -Patient is clearly uncomfortable using BiPAP at this time, we'll try to transition to comfort measures.  Date: 02/27/2016  MRN# [id number] Cynthia [name] JYNW 1925-04-18  Referring Physician:   TYSHELL RAMBERG is a 83 y.o. old female seen in consultation for chief complaint of:    Chief Complaint  Patient presents with  . Respiratory Distress    HPI:   The patient is a 83 year old female with a beer emphysema. Review of her chest x-ray images shows severe emphysema, venous blood  gas shows a compensated acute on chronic hypercapnic and hypoxic respiratory failure. History is obtained from the family, from chart, as the patient is unable to provide history due to use of BiPAP and respiratory distress. Family notes that the patient was admitted in October with similar picture. Her functional status has not recovered since that time. She is unable to walk around her house without having severe exertional dyspnea, she has to take a break when moving from room to room. She is on 2 L of oxygen at home chronically. Her family noted that over the last 3-4 days. Her breathing has become particularly worse, and she was wheezing. -She recently saw her pulmonologist, Dr. Park Breed, and the family notes that she was recommended to transition to hospice. They were in agreement with this. However, they have not yet had time to make arrangements. I talked with them further. They note that the patient is extremely claustrophobic, she has trouble tolerating the mask for BiPAP, and has to be sedated significantly in order to tolerated. Currently, she is yelling for the mask for [place] to take it off.   PMHX:   Past Medical History:  Diagnosis Date  . Abdominal pain, epigastric   . Allergic drug reaction   . Aneurysm of aorta (HCC)   . Asthma   . Breathlessness on exertion   . CAD (coronary artery disease)   . COPD (chronic obstructive pulmonary disease) (HCC)   . GERD (gastroesophageal reflux disease)   . History of partial colectomy   . Hypercholesteremia   . Hypertension   . Hypothyroidism   . Hypoxia   . Insomnia   . Nocturia   .  Olecranon bursitis   . Osteoarthritis   . Osteoporosis   . Pneumonia   . Renal failure   . Seborrheic keratoses   . Spinal stenosis of lumbar region without neurogenic claudication   . Stomatitis monilial   . Stroke (HCC)   . UTI symptoms   . Vaginal atrophy    Surgical Hx:  Past Surgical History:  Procedure Laterality Date  . APPENDECTOMY    .  APPLICATION OF WOUND VAC Right 05/26/2015   Procedure: APPLICATION OF WOUND VAC;  Surgeon: Annice Needy, MD;  Location: [facility] ORS;  Service: Vascular;  Laterality: Right;  . BACK SURGERY    . COLON SURGERY     Hole in colon that was repaired  . EYE SURGERY Bilateral    Cataract extraction with IOL  . Multilevel spinal fusion and laminectomy  2010   Due to spinal stenosis Dr. Gerrit Heck  . PERIPHERAL VASCULAR CATHETERIZATION N/A 03/16/2015   Procedure: Abdominal Aortogram w/Lower Extremity;  Surgeon: Renford Dills, MD;  Location: [facility];  Service: Cardiovascular;  Laterality: N/A;  . PERIPHERAL VASCULAR CATHETERIZATION  03/16/2015   Procedure: Lower Extremity Intervention;  Surgeon: Renford Dills, MD;  Location: [facility];  Service: Cardiovascular;;  . PERIPHERAL VASCULAR CATHETERIZATION Bilateral 04/20/2015   Procedure: Endovascular Repair/Stent Graft;  Surgeon: Renford Dills, MD;  Location: [facility];  Service: Cardiovascular;  Laterality: Bilateral;  . Rotator cuff surgery  2007  . VAGINAL HYSTERECTOMY  1960  . WOUND DEBRIDEMENT Right 05/26/2015   Procedure: DEBRIDEMENT WOUND ( RIGHT GROIN DEBRIDEMENT );  Surgeon: Annice Needy, MD;  Location: [facility] OR[facility];  Service: Vascular;  Laterality: Right;  . WRIST SURGERY     lump removed due to pain   Family Hx:  Family History  Problem Relation Age of Onset  . Heart disease Brother   . Cancer Brother   . COPD Sister   . Diabetes Sister   . Hypertension Brother   . Diabetes Brother    Social Hx:   Social History  Substance Use Topics  . Smoking status: Former Smoker    Packs/day: 0.50    Types: Cigarettes  . Smokeless tobacco: Never Used     Comment: quit 11 years ago  . Alcohol use 4.2 oz/week    7 Glasses of wine per week     Comment: 2 oz of wine at night   Medication:   Reviewed    Allergies:  Nitrofurantoin monohyd macro; Levaquin [levofloxacin]; Adhesive [tape]; Codeine; Crestor  [rosuvastatin]; Duratuss g [guaifenesin]; Oxycodone-acetaminophen; Penicillins; Propoxyphene; Tramadol; and Morphine  Review of Systems: Could not be obtained due to critical illness.  Physical Examination:   VS: BP 114/71   Pulse 70   Resp (!) 23   Ht 5\' 1"  (1.549 m)   Wt 134 lb (60.8 kg)   SpO2 99%   BMI 25.32 kg/m   General Appearance: Moderate distress  Neuro:without focal findings,  nonfocal. HEENT: PERRLA, EOM intact.   Pulmonary: Diffuse bilateral expiratory wheeze. CardiovascularNormal S1,S2.  No m/r/g.   Abdomen: Benign, Soft, non-tender. Renal:  No costovertebral tenderness  GU:  No performed at this time. Endoc: No evident thyromegaly, no signs of acromegaly. Skin:   warm, no rashes, no ecchymosis  Extremities: normal, no cyanosis, clubbing.  Other findings:    LABORATORY PANEL:   CBC  Recent Labs Lab 02/27/16 0518  WBC 5.6  HGB 13.2  HCT 38.9  PLT 238   ------------------------------------------------------------------------------------------------------------------  Chemistries   Recent Labs Lab 02/27/16 0518  NA 137  K 4.5  CL 99*  CO2 29  GLUCOSE 125*  BUN 20  CREATININE 0.94  CALCIUM 8.6*  AST 31  ALT 14  ALKPHOS 57  BILITOT 0.5   ------------------------------------------------------------------------------------------------------------------  Cardiac Enzymes  Recent Labs Lab 02/27/16 0518  TROPONINI 0.22*   ------------------------------------------------------------  RADIOLOGY:  Dg Chest Portable 1 View  Result Date: 02/27/2016 CLINICAL DATA:  Respiratory distress. EXAM: PORTABLE CHEST 1 VIEW COMPARISON:  11/15/2015 FINDINGS: Stable hyperinflation and bronchial thickening. Unchanged heart size and mediastinal contours. There is atherosclerosis of the thoracic aorta. No definite focal airspace disease, pleural fluid or pneumothorax. Left basilar scarring. Overlying BiPAP tubing projects over the left hemithorax. Unchanged  osseous structures. IMPRESSION: 1. Stable hyperinflation and emphysema. No evidence of acute abnormality. 2. Thoracic aortic atherosclerosis. Electronically Signed   By: Rubye OaksMelanie  Ehinger M.D.   On: 02/27/2016 06:04       Thank  you for the consultation and for allowing [facility] to assist in the care of your patient. Our recommendations are noted above.  Please contact us if we can be of further service.   Wells Guileseep Leeona Mccardle, MD.  Board Certified in Internal Medicine, Pulmonary Medicine, Critical Care Medicine, and Sleep Medicine.  [facility] Office Number: [phone]  Santiago Gladavid Kasa, M.D.  Stephanie AcreVishal Mungal, M.D.  Billy Fischeravid Simonds, M.D  02/27/2016

## 2016-02-27 NOTE — ED Provider Notes (Addendum)
-----------------------------------------   10:23 AM on 02/27/2016 -----------------------------------------  Patient was signed out to me this morning, has been evaluated by the ICU doctor, she was to be an admission, however, patient is DO NOT RESUSCITATE and family would wish her to go to hospice. Apparently they have already arranging hospice at home. The patient has been on BiPAP. The family is okay with continuing BiPAP but it does make her anxious. They would like me to give her Ativan if she wakes up. She is tolerating it right now with no difficulty. Obviously there is some danger to giving several and Ativan mother on BiPAP at her age however, at this time the only goal of care is to make sure the patient is comfortable. They do not wish her taken off BiPAP just yet because they're concerned about the possibility of air hunger which I do not think is unreasonable. Therefore, we'll continue to watch the patient, ICU has declined to admit her, and social work is working on whether we can get hospice placement for her.   Jeanmarie PlantJames A Kyriaki Moder, MD 02/27/16 1027   ----------------------------------------- 12:38 PM on 02/27/2016 -----------------------------------------  Patient did become anxious on the BiPAP we did give her Ativan. We did talk to the family about whether they wish to discontinue BiPAP but they did not wish to do so at that moment. Hospice nurses come evaluate the patient. The would like to see if they can get the patient off the BiPAP family is agreeable to this as long as they are available to give her medication in case of air hunger. I'm certainly available for that. They understand that this is a terminal condition and that she will most likely die. This is in concordance with her wishes according to family and are returning. Hospice still try to figure out if they have a bed for her. I am at their disposal when they wish me to remove the  BiPAP.  ----------------------------------------- 12:45 PM on 02/27/2016 -----------------------------------------  Family and hospice vascular withdrawal BiPAP which we will do. Patient has been mildly agitated. They have asked me to give her morphine. Her allergy to morphine is "picking things out of the air and getting itchy, no anaphylaxis. This time, concern for comfort and air hunger is more important we will continue with their plan to give morphine.  ----------------------------------------- 3:16 PM on 02/27/2016 -----------------------------------------  History resting calmly at this time, hospice will arrange disposition. They will arrange outpatient medication for her they tell me. Patient signed out to Dr. Derrill KayGoodman at the end of my shift.   Jeanmarie PlantJames A Bernise Sylvain, MD 02/27/16 1239    Jeanmarie PlantJames A Miaisabella Bacorn, MD 02/27/16 1245    Jeanmarie PlantJames A Ramonia Mcclaran, MD 02/27/16 [phone]

## 2016-02-27 NOTE — Care Management Note (Signed)
Case Management Note  Patient Details  Name: Cynthia [name] MRN: [id number] Date of Birth: 12-17-1925  Subjective/Objective:    Verified with nieces at bedside that the patient already has oxygen, and a hospital bed at home. They are currently hoping Hospice care would provide 24 hour care  For the patient. I have explained that karen is tied up at present , but will be down to see them as soon as possible. They are pleasant and content to wait for her.                Action/Plan:   Expected Discharge Date:                  Expected Discharge Plan:     In-House Referral:     Discharge planning Services     Post Acute Care Choice:    Choice offered to:     DME Arranged:    DME Agency:     HH Arranged:    HH Agency:     Status of Service:     If discussed at [employer] Length of Stay Meetings, dates discussed:    Additional Comments:  Berna BueCheryl Tenise Stetler, RN 02/27/2016, 10:17 AM

## 2016-02-27 NOTE — Care Management Note (Signed)
Case Management Note  Patient Details  Name: Cynthia [name] MRN: [id number] Date of Birth: November 29, 1925  Subjective/Objective:           [facility] once [facility] bed is ready for her. Clydie BraunKaren will call EMS when she gets that notification. The yellow DNR and facesheets for the patient have been placed in a brown envelope on the chart. April unit clerk has been made aware.         Action/Plan:   Expected Discharge Date:                  Expected Discharge Plan:     In-House Referral:     Discharge planning Services     Post Acute Care Choice:    Choice offered to:     DME Arranged:    DME Agency:     HH Arranged:    HH Agency:     Status of Service:     If discussed at [employer] Length of Stay Meetings, dates discussed:    Additional Comments:  Berna BueCheryl Yury Schaus, RN 02/27/2016, 2:00 PM

## 2016-02-27 NOTE — Consult Note (Signed)
Consultation Note Date: 02/27/2016   Patient Name: Cynthia [name]  DOB: 1925/08/16  MRN: [id number]  Age / Sex: 83 y.o., female  PCP: Malva Limes, MD Referring Physician: No att. providers found  Reason for Consultation: Non pain symptom management, Terminal Care and Withdrawal of life-sustaining treatment and called to assist with transition off BiPAP so that she may transition to hospice facility from ED.   HPI/Patient Profile: 83 y.o. female  with past medical history of advanced COPD, CAD, HTN, insomnia, osteoarthritis, lumbar spinal stenosis, h/o stroke admitted on 02/27/2016 with acute respiratory decline over the past 2 days.   Clinical Assessment and Goals of Care: I was called to the bedside to assist Ms. Vangorden to wean off the BiPAP. She has reportedly had significant dyspnea and anxiety. She has received morphine recently but still not completely comfortable. RN called to bedside and administered morphine and ativan to help maintain comfort as BiPAP removed. Placed on nasal cannula 5L. Ms. Mergenthaler is lethargic but smiles at [place] and attempts to talk when BiPAP is removed - very happy to be off BiPAP.  Numerous family members at bedside - nieces and sister-in-law, all appear very supportive. All agree on DNR, comfort measures, and transition to hospice facility (hospice RN Clydie Braun arranging for transport today). Explained signs of discomfort and distress as well as treatment of symptoms at EOL. Explained expectations at EOL. Emotional support provided. PRNs added. Appears stable for transport. ED RN updated and to call for further palliative assistance.   Primary Decision Maker HCPOA niece Arline Asp at bedside    SUMMARY OF RECOMMENDATIONS   - DNR - Comfort care - Transition to hospice facility  Code Status/Advance Care Planning:  DNR   Symptom Management:   Dyspnea: Morphine 2-4 mg IV every 30 min prn.    Anxiety: Ativan 1 mg every 2 hours prn.   May use oral concentrates at hospice facility.   Palliative Prophylaxis:   Aspiration, Delirium Protocol, Frequent Pain Assessment, Oral Care and Turn Reposition  Additional Recommendations (Limitations, Scope, Preferences):  Full Comfort Care  Psycho-social/Spiritual:   Desire for further Chaplaincy support:no - personal chaplain has been at bedside already providing support  Additional Recommendations: Caregiving  Support/Resources, Education on Hospice and Grief/Bereavement Support  Prognosis:   Hours - Days  Discharge Planning: Hospice facility      Primary Diagnoses: Present on Admission: **None**   I have reviewed the medical record, interviewed the patient and family, and examined the patient. The following aspects are pertinent.  Past Medical History:  Diagnosis Date  . Abdominal pain, epigastric   . Allergic drug reaction   . Aneurysm of aorta (HCC)   . Asthma   . Breathlessness on exertion   . CAD (coronary artery disease)   . COPD (chronic obstructive pulmonary disease) (HCC)   . GERD (gastroesophageal reflux disease)   . History of partial colectomy   . Hypercholesteremia   . Hypertension   . Hypothyroidism   . Hypoxia   . Insomnia   .  Nocturia   . Olecranon bursitis   . Osteoarthritis   . Osteoporosis   . Pneumonia   . Renal failure   . Seborrheic keratoses   . Spinal stenosis of lumbar region without neurogenic claudication   . Stomatitis monilial   . Stroke (HCC)   . UTI symptoms   . Vaginal atrophy    Social History   Social History  . Marital status: Widowed    Spouse name: N/A  . Number of children: N/A  . Years of education: N/A   Social History Main Topics  . Smoking status: Former Smoker    Packs/day: 0.50    Types: Cigarettes  . Smokeless tobacco: Never Used     Comment: quit 11 years ago  . Alcohol use 4.2 oz/week    7 Glasses of wine per week     Comment: 2 oz of wine at  night  . Drug use: No  . Sexual activity: Not Asked   Other Topics Concern  . None   Social History Narrative  . None   Family History  Problem Relation Age of Onset  . Heart disease Brother   . Cancer Brother   . COPD Sister   . Diabetes Sister   . Hypertension Brother   . Diabetes Brother    Scheduled Meds: . ipratropium-albuterol  3 mL Nebulization Q4H   Continuous Infusions: PRN Meds:.LORazepam, morphine injection Allergies  Allergen Reactions  . Nitrofurantoin Monohyd Macro Shortness Of Breath  . Levaquin [Levofloxacin] Rash  . Adhesive [Tape] Other (See Comments)    "pull skin off"  . Codeine Itching  . Crestor [Rosuvastatin] Other (See Comments)    Reaction:  Dizziness   . Duratuss G [Guaifenesin] Nausea And Vomiting  . Oxycodone-Acetaminophen Itching  . Penicillins Other (See Comments)    Reaction: unknown   . Propoxyphene Itching  . Tramadol Itching  . Morphine Itching and Rash   Review of Systems  Unable to perform ROS: Acuity of condition    Physical Exam  Constitutional: She appears well-developed. She appears lethargic.  HENT:  Head: Normocephalic and atraumatic.  Cardiovascular: Normal rate and regular rhythm.   Pulmonary/Chest: Accessory muscle usage present. Tachypnea noted. She is in respiratory distress. She has decreased breath sounds. She has wheezes.  Tight   Abdominal: Soft. Normal appearance.  Neurological: She appears lethargic.  Nursing note and vitals reviewed.   Vital Signs: BP 140/68   Pulse 65   Resp (!) 22   Ht 5\' 1"  (1.549 m)   Wt 60.8 kg (134 lb)   SpO2 92%   BMI 25.32 kg/m      Pain Score: 0-No pain   SpO2: SpO2: 92 % O2 Device:SpO2: 92 % O2 Flow Rate: .O2 Flow Rate (L/min): 5 L/min  IO: Intake/output summary:  Intake/Output Summary (Last 24 hours) at 02/27/16 1400 Last data filed at 02/27/16 0649  Gross per 24 hour  Intake             1050 ml  Output                0 ml  Net             1050 ml     LBM:   Baseline Weight: Weight: 60.8 kg (134 lb) Most recent weight: Weight: 60.8 kg (134 lb)     Palliative Assessment/Data:     Time In: 1310 Time Out: 1420 Time Total: 70min Greater than 50%  of this time was spent  counseling and coordinating care related to the above assessment and plan.  Signed by: Vinie Sill, NP Palliative Medicine Team Pager # [phone] (M-F 8a-5p) Team Phone # [phone] (Nights/Weekends)

## 2016-02-27 NOTE — ED Notes (Signed)
Patient taken off of bipap and placed on [place] by RT.  Helmut MusterAlicia, Hospice NP and Clydie BraunKaren at bedside.

## 2016-02-27 NOTE — Progress Notes (Signed)
Writer back to the ED to check on Cynthia [name], family remains at bedside. Patient currently on 5 liters of oxygen via nasal cannula, her breathing is regular and appears unlabored, some upper airway congestion noted.  Emotional support and endo of life education provided to family. Report called to the hospice home, EMS notified for transport. Family and ED staff aware. Thank you. Dayna BarkerKaren Robertson RN, BSN, [facility] [place], hospital liaison [phone] c

## 2016-02-27 NOTE — Discharge Instructions (Signed)
Continue the Ativan, and morphine, and Liquifilm tears per hospice and palliative care.

## 2016-02-27 NOTE — ED Triage Notes (Addendum)
Pt coming from home via EMS in respiratory distress. Pt normally on 2.5L [place] at home and pt has anxiety about breathing treatments and was barely doing them at home and refused all treatment via EMS.

## 2016-02-27 NOTE — ED Notes (Signed)
Patient agitated, wanting to take bipap off.  Family trying to console patient.  MD notified, medication ordered.

## 2016-02-27 NOTE — Progress Notes (Signed)
New referral for [facility] [place] [facility] received from [facility]. Cynthia [name] is an 83 year old woman brought to the ED by her family for evaluation and treatment of acute respiratory failure. She has required Bipap, which per her family is something she did not want. Patient seen lying on the ED stretcher, eyes closed, did repspond to name when spoken to, nodded when asked if she wanted the Bipap off. Writer met in the room with patient's nieces; Shonna Chock and [place] as well as her sister in law to discuss options for hospice care. Writer initiated education regarding hospice services both at home and at the hospice home. Of note patient lives alone and her nieces check on her, she had a very difficult weekend with dyspnea and they all took turns staying with her through out the last 3 days. They were very clear that the focus was on her comfort and expressed several times how much she "hated the Bipap". They would like her to discharge to the hospice home if stable. Writer spoke with both attending physician Dr. Burlene Arnt and also with palliative NP Vinie Sill, plan is for for the discontinuation of the Bipap with the use of morphine for air hunger and lorazepam for anxiety. Questions answered, consents signed. Patient information faxed to hospice referral. Hospital care team all aware and in agreement with discharge plan, Signed DNR in place in discharge packet. Thank you. Flo Shanks RN, BSN, [facility] [place], hospital Liaison [phone] c

## 2016-02-27 NOTE — Progress Notes (Signed)
See  By ICU physician,DR.Nicholos JohnsRamachandran ,he  called me and  Told me that  family wants her to transition to hospice care and the patient is claustrophobic and does not want BiPAP. I  Discussed this with admission coordinator  Elnita MaxwellCheryl  To see if we can have palliative care and hospice services arranged through emergency room itself. If it doesn't work we will admit the patient. Pt has endstage copd/

## 2016-02-27 NOTE — ED Provider Notes (Signed)
[facility] Emergency Department Provider Note   ____________________________________________   First MD Initiated Contact with Patient 02/27/16 0501     (approximate)  I have reviewed the triage vital signs and the nursing notes.   HISTORY  Chief Complaint Respiratory Distress  The history is limited by the patient's respiratory distress  HPI Cynthia [name] is a 83 y.o. female who comes into the hospital today with some shortness of breath. The patient has a history of COPD and she feels short of breath. She reports that this started when she left the hospital 2 months ago but according to family she started having the symptoms 2 days ago. EMS reports that she refused any treatments by them. The patient reports that she doesn't want anything over her face. She states that she had been using her breathing treatments at home but has not been helping. The patient denies chest pain, nausea, vomiting. She reports that she is coughing but not more than normal and is not productive. The patient has not had any fevers. She is here for evaluation.    Past Medical History:  Diagnosis Date  . Abdominal pain, epigastric   . Allergic drug reaction   . Aneurysm of aorta (HCC)   . Asthma   . Breathlessness on exertion   . CAD (coronary artery disease)   . COPD (chronic obstructive pulmonary disease) (HCC)   . GERD (gastroesophageal reflux disease)   . History of partial colectomy   . Hypercholesteremia   . Hypertension   . Hypothyroidism   . Hypoxia   . Insomnia   . Nocturia   . Olecranon bursitis   . Osteoarthritis   . Osteoporosis   . Pneumonia   . Renal failure   . Seborrheic keratoses   . Spinal stenosis of lumbar region without neurogenic claudication   . Stomatitis monilial   . Stroke (HCC)   . UTI symptoms   . Vaginal atrophy     Patient Active Problem List   Diagnosis Date Noted  . Acute respiratory failure with hypoxia (HCC) 11/14/2015  .  Easy bruising 10/27/2015  . COPD exacerbation (HCC) 07/03/2015  . Ischemic leg 04/20/2015  . Cellulitis 02/08/2015  . Atherosclerosis of coronary artery 11/11/2014  . Centriacinar emphysema (HCC) 11/11/2014  . CAFL (chronic airflow limitation) (HCC) 11/11/2014  . Polypharmacy 11/11/2014  . H/O hemicolectomy 11/11/2014  . Adult hypothyroidism 11/11/2014  . Hypoxia 11/11/2014  . Bursitis of elbow 11/11/2014  . Degenerative arthritis of hip 11/11/2014  . Basal cell papilloma 11/11/2014  . H/O respiratory system disease 07/29/2014  . Inflammation of sacroiliac joint (HCC) 09/29/2013  . AA (aortic aneurysm) (HCC) 07/19/2009  . Arthritis of hand, degenerative 11/12/2008  . Acid reflux 11/27/2007  . Lumbar canal stenosis 09/16/2007  . Hypercholesteremia 01/31/2007  . BP (high blood pressure) 11/20/2005  . OP (osteoporosis) 11/20/2005    Past Surgical History:  Procedure Laterality Date  . APPENDECTOMY    . APPLICATION OF WOUND VAC Right 05/26/2015   Procedure: APPLICATION OF WOUND VAC;  Surgeon: Annice Needy, MD;  Location: [facility] ORS;  Service: Vascular;  Laterality: Right;  . BACK SURGERY    . COLON SURGERY     Hole in colon that was repaired  . EYE SURGERY Bilateral    Cataract extraction with IOL  . Multilevel spinal fusion and laminectomy  2010   Due to spinal stenosis Dr. Gerrit Heck  . PERIPHERAL VASCULAR CATHETERIZATION N/A 03/16/2015   Procedure: Abdominal Aortogram  w/Lower Extremity;  Surgeon: Renford Dills, MD;  Location: [facility] INVASIVE CV LAB;  Service: Cardiovascular;  Laterality: N/A;  . PERIPHERAL VASCULAR CATHETERIZATION  03/16/2015   Procedure: Lower Extremity Intervention;  Surgeon: Renford Dills, MD;  Location: [facility] INVASIVE CV LAB;  Service: Cardiovascular;;  . PERIPHERAL VASCULAR CATHETERIZATION Bilateral 04/20/2015   Procedure: Endovascular Repair/Stent Graft;  Surgeon: Renford Dills, MD;  Location: [facility] INVASIVE CV LAB;  Service: Cardiovascular;  Laterality:  Bilateral;  . Rotator cuff surgery  2007  . VAGINAL HYSTERECTOMY  1960  . WOUND DEBRIDEMENT Right 05/26/2015   Procedure: DEBRIDEMENT WOUND ( RIGHT GROIN DEBRIDEMENT );  Surgeon: Annice Needy, MD;  Location: [facility] ORS;  Service: Vascular;  Laterality: Right;  . WRIST SURGERY     lump removed due to pain    Prior to Admission medications   Medication Sig Start Date End Date Taking? Authorizing Provider  acetaminophen (TYLENOL) 325 MG tablet Take 1-2 tablets (325-650 mg total) by mouth every 4 (four) hours as needed for mild pain (or temp >/= 101 F). 04/23/15  Yes Kimberly A Stegmayer, PA-C  albuterol (PROVENTIL HFA;VENTOLIN HFA) 108 (90 Base) MCG/ACT inhaler Inhale 2 puffs into the lungs every 6 (six) hours as needed for wheezing or shortness of breath. 02/15/15  Yes Dennis E Chrismon, PA  albuterol (PROVENTIL) (2.5 MG/3ML) 0.083% nebulizer solution Take 2.5 mg by nebulization every 6 (six) hours as needed for wheezing or shortness of breath. Pt mixes with her Atrovent nebulizer solution.   Yes Historical Provider, MD  ALPRAZolam (XANAX) 0.25 MG tablet Take 0.25 mg by mouth 2 (two) times daily as needed for anxiety.  02/21/16  Yes Historical Provider, MD  benzonatate (TESSALON) 200 MG capsule Take 200 mg by mouth 3 (three) times daily as needed for cough.   Yes Historical Provider, MD  carvedilol (COREG) 3.125 MG tablet Take 3.125 mg by mouth 2 (two) times daily with a meal.   Yes Historical Provider, MD  clopidogrel (PLAVIX) 75 MG tablet Take 1 tablet (75 mg total) by mouth daily. 04/23/15  Yes Kimberly A Stegmayer, PA-C  dextromethorphan-guaiFENesin (MUCINEX DM) 30-600 MG 12hr tablet Take 1 tablet by mouth 2 (two) times daily as needed for cough.    Yes Historical Provider, MD  diclofenac sodium (VOLTAREN) 1 % GEL Apply 2 g topically 4 (four) times daily as needed (for pain). Reported on 07/15/2015   Yes Historical Provider, MD  fexofenadine (ALLEGRA) 180 MG tablet Take 180 mg by mouth daily.   Yes  Historical Provider, MD  fluticasone furoate-vilanterol (BREO ELLIPTA) 200-25 MCG/INH AEPB Inhale 1 puff into the lungs daily.   Yes Historical Provider, MD  furosemide (LASIX) 20 MG tablet Take 20 mg by mouth daily.   Yes Historical Provider, MD  ipratropium (ATROVENT) 0.02 % nebulizer solution Take 2.5 mLs (0.5 mg total) by nebulization every 6 (six) hours as needed for wheezing or shortness of breath. Pt mixes with her albuterol nebulizer solution. 04/23/15  Yes Kimberly A Stegmayer, PA-C  lansoprazole (PREVACID) 30 MG capsule Take 1 capsule (30 mg total) by mouth daily. 07/15/15  Yes Malva Limes, MD  levothyroxine (SYNTHROID, LEVOTHROID) 75 MCG tablet Take 75 mcg by mouth daily before breakfast.   Yes Historical Provider, MD  nitroGLYCERIN (NITROSTAT) 0.4 MG SL tablet Place 0.4 mg under the tongue every 5 (five) minutes as needed for chest pain. Reported on 06/14/2015   Yes Historical Provider, MD  predniSONE (DELTASONE) 10 MG tablet Take 10 mg by  mouth daily with breakfast.    Yes Historical Provider, MD  roflumilast (DALIRESP) 500 MCG TABS tablet Take 500 mcg by mouth daily.   Yes Historical Provider, MD  Tiotropium Bromide Monohydrate (SPIRIVA RESPIMAT) 2.5 MCG/ACT AERS Inhale 1 puff into the lungs daily at 8 pm.   Yes Historical Provider, MD    Allergies Nitrofurantoin monohyd macro; Levaquin [levofloxacin]; Adhesive [tape]; Codeine; Crestor [rosuvastatin]; Duratuss g [guaifenesin]; Oxycodone-acetaminophen; Penicillins; Propoxyphene; Tramadol; and Morphine  Family History  Problem Relation Age of Onset  . Heart disease Brother   . Cancer Brother   . COPD Sister   . Diabetes Sister   . Hypertension Brother   . Diabetes Brother     Social History Social History  Substance Use Topics  . Smoking status: Former Smoker    Packs/day: 0.50    Types: Cigarettes  . Smokeless tobacco: Never Used     Comment: quit 11 years ago  . Alcohol use 4.2 oz/week    7 Glasses of wine per week      Comment: 2 oz of wine at night    Review of Systems Constitutional: No fever/chills Eyes: No visual changes. ENT: No sore throat. Cardiovascular: Denies chest pain. Respiratory:  shortness of breath. Gastrointestinal: No abdominal pain.  No nausea, no vomiting.  No diarrhea.  No constipation. Genitourinary: Negative for dysuria. Musculoskeletal: Negative for back pain. Skin: Negative for rash. Neurological: Negative for headaches, focal weakness or numbness.  10-point ROS otherwise negative.  ____________________________________________   PHYSICAL EXAM:  VITAL SIGNS: ED Triage Vitals  Enc Vitals Group     BP 02/27/16 0513 (!) 142/101     Pulse Rate 02/27/16 0513 77     Resp 02/27/16 0513 (!) 24     Temp --      Temp src --      SpO2 02/27/16 0513 100 %     Weight 02/27/16 0515 134 lb (60.8 kg)     Height 02/27/16 0515 5\' 1"  (1.549 m)     Head Circumference --      Peak Flow --      Pain Score 02/27/16 0530 0     Pain Loc --      Pain Edu? --      Excl. in GC? --     Constitutional: Alert and oriented. Well appearing and in Severe respiratory distress. Eyes: Conjunctivae are normal. PERRL. EOMI. Head: Atraumatic. Nose: No congestion/rhinnorhea. Mouth/Throat: Mucous membranes are moist.  Oropharynx non-erythematous. Cardiovascular: Normal rate, regular rhythm. Grossly normal heart sounds.  Good peripheral circulation. Respiratory: Increased respiratory effort.  Suprasternal retractions. Expiratory wheezes throughout all lung fields Gastrointestinal: Soft and nontender. No distention. Positive bowel sounds Musculoskeletal: No lower extremity tenderness nor edema.   Neurologic:  Normal speech and language.  Skin:  Skin is warm, dry and intact.  Psychiatric: Mood and affect are normal.   ____________________________________________   LABS (all labs ordered are listed, but only abnormal results are displayed)  Labs Reviewed  BLOOD GAS, VENOUS - Abnormal; Notable  for the following:       Result Value   pH, Ven 7.24 (*)    pCO2, Ven 77 (*)    pO2, Ven 48.0 (*)    Bicarbonate 33.0 (*)    Acid-Base Excess 3.1 (*)    All other components within normal limits  TROPONIN I - Abnormal; Notable for the following:    Troponin I 0.22 (*)    All other components within normal limits  COMPREHENSIVE  METABOLIC PANEL - Abnormal; Notable for the following:    Chloride 99 (*)    Glucose, Bld 125 (*)    Calcium 8.6 (*)    GFR calc non Af Amer 53 (*)    All other components within normal limits  CBC   ____________________________________________  EKG  ED ECG REPORT I, Rebecka Apley, the attending physician, personally viewed and interpreted this ECG.   Date: 02/27/2016  EKG Time: 507  Rate: 79  Rhythm: normal sinus rhythm  Axis: normal  Intervals:none  ST&T Change: none  ____________________________________________  RADIOLOGY  CXR ____________________________________________   PROCEDURES  Procedure(s) performed: None  Procedures  Critical Care performed: Yes, see critical care note(s)   CRITICAL CARE Performed by: Lucrezia Europe P   Total critical care time: 30 minutes  Critical care time was exclusive of separately billable procedures and treating other patients.  Critical care was necessary to treat or prevent imminent or life-threatening deterioration.  Critical care was time spent personally by me on the following activities: development of treatment plan with patient and/or surrogate as well as nursing, discussions with consultants, evaluation of patient's response to treatment, examination of patient, obtaining history from patient or surrogate, ordering and performing treatments and interventions, ordering and review of laboratory studies, ordering and review of radiographic studies, pulse oximetry and re-evaluation of patient's condition.   ____________________________________________   INITIAL IMPRESSION /  ASSESSMENT AND PLAN / ED COURSE  Pertinent labs & imaging results that were available during my care of the patient were reviewed by me and considered in my medical decision making (see chart for details).  This is an 83 year old female who comes into the hospital today with some shortness of breath. When the patient had initially arrived she received 3 DuoNeb treatments, Solu-Medrol and a dose of magnesium sulfate. We did order a VBG which showed that the patient's pH is 7.24 and a PCO2 of 77. At this point the patient was still having some significant respiratory distress. We decided to place the patient on BiPAP. The patient is a DO NOT RESUSCITATE and also a DO NOT INTUBATE. She reports that she could only tolerate it if she sedated so I did give her 2 mg of Ativan to help with her anxiety as the patient is cautioned phobic. The patient did receive a chest x-ray which does not show any pneumonia. She will receive a dose of azithromycin for COPD exacerbation and she'll be admitted to the ICU service as she is on BiPAP.  Clinical Course as of Feb 27 735  Mon Feb 27, 2016  1610 1. Stable hyperinflation and emphysema. No evidence of acute abnormality. 2. Thoracic aortic atherosclerosis.   DG Chest Portable 1 View [AW]    Clinical Course User Index [AW] Rebecka Apley, MD    The patient will be admitted to the ICU ____________________________________________   FINAL CLINICAL IMPRESSION(S) / ED DIAGNOSES  Final diagnoses:  Elevated troponin  COPD exacerbation (HCC)      NEW MEDICATIONS STARTED DURING THIS VISIT:  New Prescriptions   No medications on file     Note:  This document was prepared using Dragon voice recognition software and may include unintentional dictation errors.    Rebecka Apley, MD 02/27/16 [phone]

## 2016-02-27 NOTE — Progress Notes (Signed)
Palliative:  Ms. Scarlette Sliceury is FULL COMFORT CARE. Plan is for transition to hospice facility this afternoon. Please call for any further needs in medications to achieve comfort or any further changes in status. Full note to follow.  Yong ChannelAlicia Barry Faircloth, NP Palliative Medicine Team Pager # [phone] (M-F 8a-5p) Team Phone # [phone] (Nights/Weekends)

## 2016-02-27 NOTE — ED Notes (Signed)
Pt to be taken off of bipap and placed on 5-6L Cable, morphine 2mg /hr PRN order placed by MD.  Patient currently comfortable and resting.  Paged respiratory to assist with removing bipap.

## 2016-02-28 LAB — BLOOD GAS, VENOUS
Acid-Base Excess: 3.1 mmol/L — ABNORMAL HIGH (ref 0.0–2.0)
Bicarbonate: 33 mmol/L — ABNORMAL HIGH (ref 20.0–28.0)
FIO2: 0.44
O2 Saturation: 75.5 %
Patient temperature: 37
pCO2, Ven: 77 mmHg (ref 44.0–60.0)
pH, Ven: 7.24 — ABNORMAL LOW (ref 7.250–7.430)
pO2, Ven: 48 mmHg — ABNORMAL HIGH (ref 32.0–45.0)

## 2016-02-29 DIAGNOSIS — Z515 Encounter for palliative care: Secondary | ICD-10-CM | POA: Insufficient documentation

## 2016-03-04 DIAGNOSIS — J449 Chronic obstructive pulmonary disease, unspecified: Secondary | ICD-10-CM | POA: Diagnosis not present

## 2016-03-05 ENCOUNTER — Telehealth: Payer: Self-pay | Admitting: Family Medicine

## 2016-03-05 NOTE — Telephone Encounter (Signed)
[name] at hospice Home called to report that Cynthia [name] expired on 03/04/15 at 3:50pm/  Thank sTeri

## 2016-03-05 NOTE — Telephone Encounter (Signed)
Please review. Thanks!  

## 2016-03-15 DEATH — deceased

## 2017-10-24 IMAGING — CT CT ANGIO AOBIFEM WO/W CM
1 of 2 series · 10 of 32 positions shown, 13 images · IV contrast (APPLIED)
Comparison: Prior CT abdomen/pelvis 03/12/2014

CLINICAL DATA: 89-year-old female with a history of abdominal
aortic aneurysm and peripheral arterial disease status post
bilateral iliac artery stenting on 04/20/2015.

EXAM:
CT ANGIOGRAPHY OF ABDOMINAL AORTA WITH ILIOFEMORAL RUNOFF
TECHNIQUE: Multidetector CT imaging of the abdomen, pelvis and lower
extremities was performed using the standard protocol during bolus
administration of intravenous contrast. Multiplanar CT image
reconstructions and MIPs were obtained to evaluate the vascular
anatomy.
CONTRAST:  100 mL Isovue 370

[Series 4: axial arterial · axial · arterial · 0.69mm/px · z∈[+630,+1280]mm · 10 of 396 slices shown, 13 images]
[im 47/396  soft-tissue]
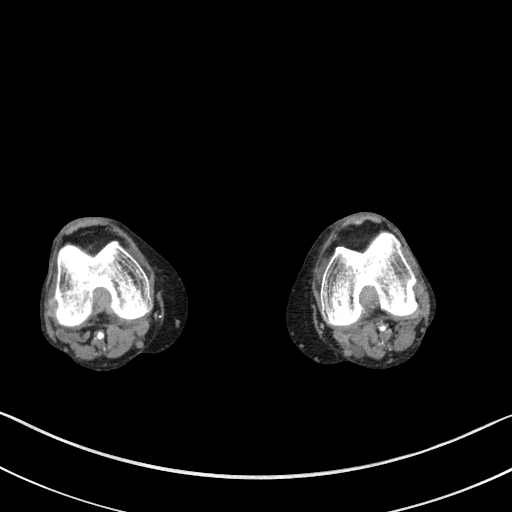
[im 47/396  bone]
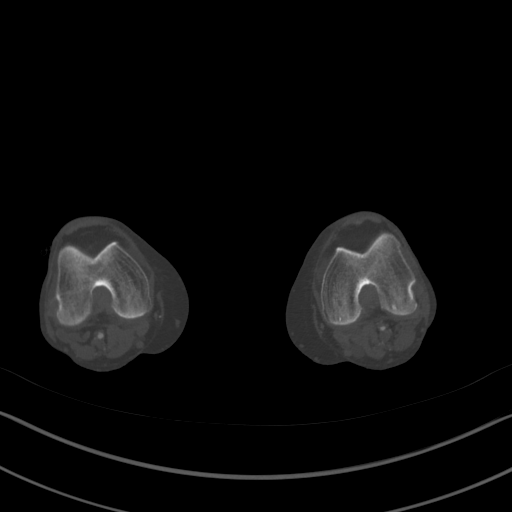
[im 93/396  soft-tissue]
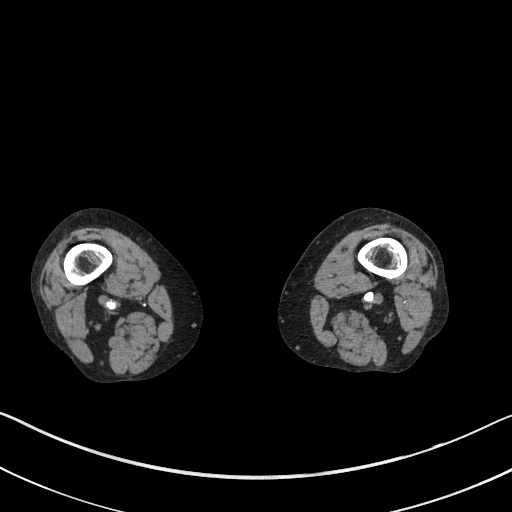
[im 140/396  soft-tissue]
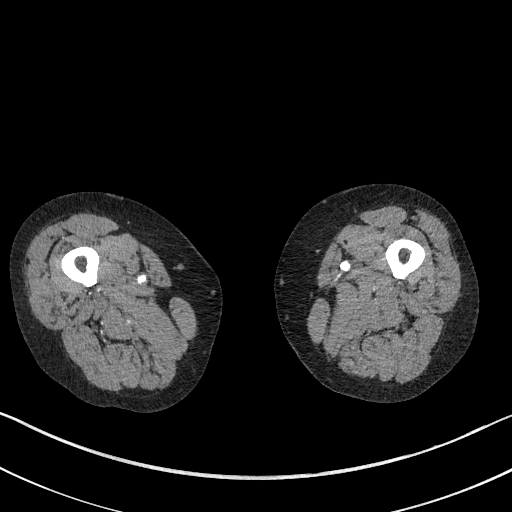
[im 186/396  soft-tissue]
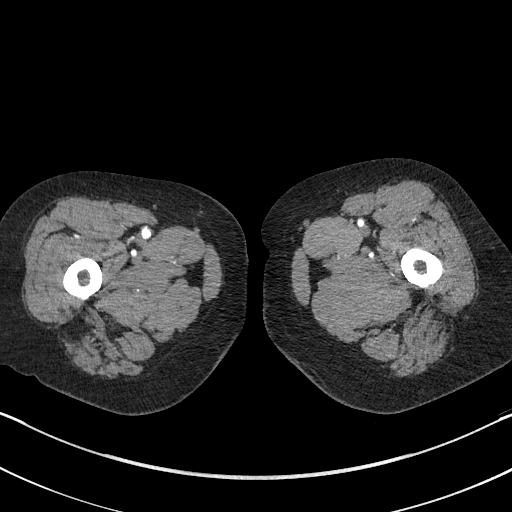
[im 233/396  soft-tissue]
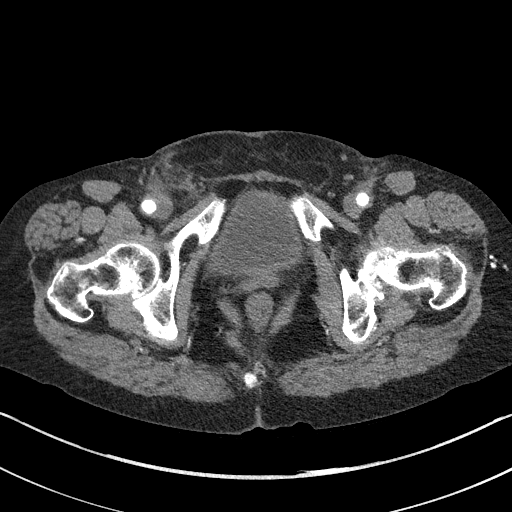
[im 279/396  soft-tissue]
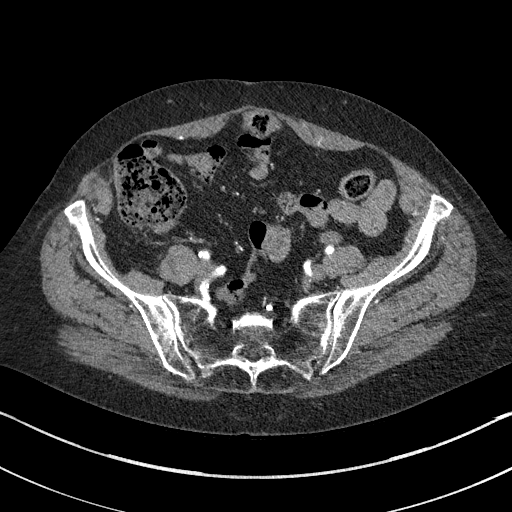
[im 303/396  lung]
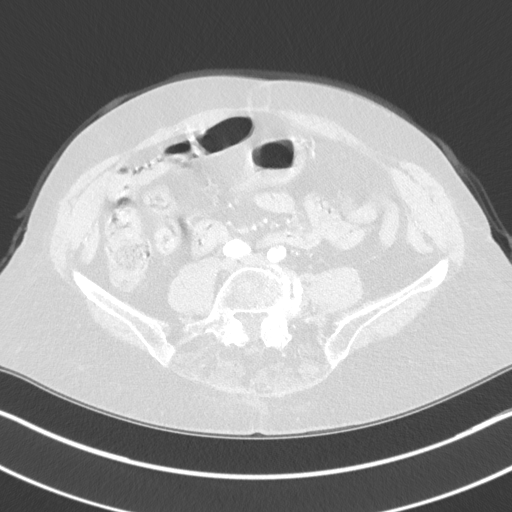
[im 326/396  soft-tissue]
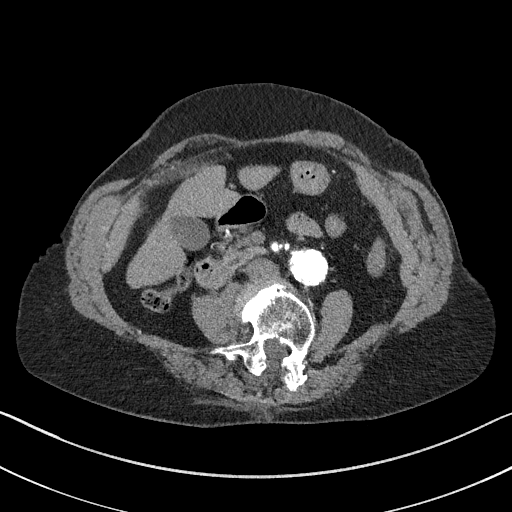
[im 326/396  lung]
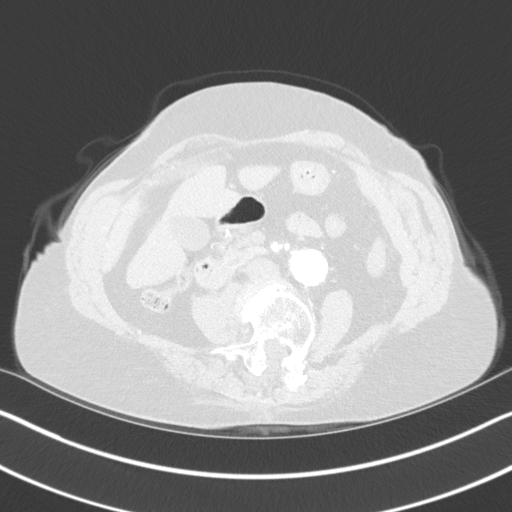
[im 349/396  lung]
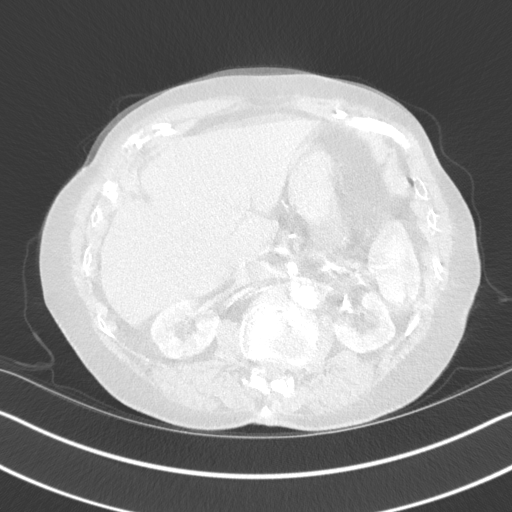
[im 372/396  soft-tissue]
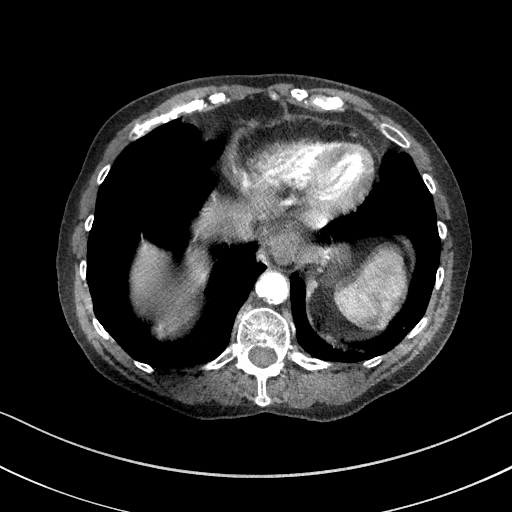
[im 372/396  lung]
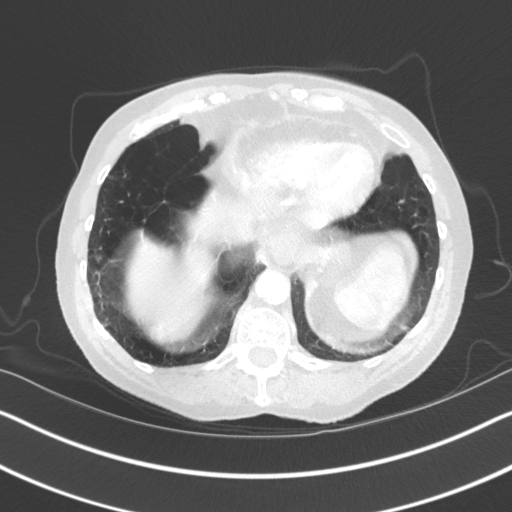

[10 of 32 positions shown; findings below may reference images not displayed]

FINDINGS: VASCULAR

Aorta: Diffusely atherosclerotic aorta with ectatic dilatation of
the infrarenal segment with a maximal transverse diameter of 2.7 cm
(best measured on the coronal reformatted images) which is unchanged
compared to prior. Interval endovascular changes of placement of a
metallic stent in the distal abdominal aorta in a region of previous
narrowing and kissing iliac stents placed coaxially within the
aortic stent. The stents are widely patent.

Celiac: Limited evaluation secondary to significant motion artifact.
The vessel is patent. There appears to be conventional hepatic
arterial anatomy. No visceral artery aneurysm.

SMA: Perhaps mild narrowing at the origin. The vessel is otherwise
patent. No aneurysm.

Renals: Both renal arteries are patent at the origin. No significant
stenosis.

IMA: Patent.  The origin is not well seen.

RIGHT Lower Extremity

Inflow: Patent right common iliac artery stent. Extensive calcified
atherosclerotic disease in the native common iliac artery. Probable
significant stenosis at the origin of the internal iliac artery. The
external iliac artery is moderately diseased. There is a focal
moderate (40- 50%) stenosis in the mid external iliac artery.

Outflow: Interval surgical changes of right common femoral
endarterectomy. The profunda femoral artery is widely patent. The
superficial femoral artery demonstrates diffuse densely calcified
atherosclerotic plaque. There is a focal high-grade stenosis versus
occlusion in the upper third of the thigh with early reconstitution.
Distal to this, there are multifocal regions of moderate and severe
stenosis secondary to bulky calcified atherosclerotic plaque. The
vessel likely occludes again for a short segment in the adductor
canal before again reconstituting. The above the knee popliteal
artery is diffusely diseased with largely bulky atherosclerotic
plaque. There are multifocal mild and moderate regions of narrowing.
Probable high-grade focal stenosis just above the knee joint.

Runoff: The anterior tibial artery is diffusely diseased with
calcified atherosclerotic plaque but remains patent to the level of
the ankle. The posterior tibial artery is likely congenitally
hypoplastic. The peroneal artery appears to continue as the
posterior tibial artery but occludes in the mid calf.

LEFT lower Extremity

Inflow: Widely patent left common iliac artery stent.
Mild-to-moderate narrowing at the origin of the internal iliac
artery. The external iliac artery is relatively disease free.

Outflow: Surgical changes of left common femoral endarterectomy. The
profunda femoral artery is widely patent. Superficial femoral artery
demonstrates multifocal bulky calcified atherosclerotic plaque.
There is a focal high-grade stenosis in the mid to distal thigh. The
vessel remains patent through the adductor canal. There is a second
high-grade stenosis of the above the knee popliteal artery. The
remainder the popliteal artery is diseased but demonstrates no focal
stenosis.

Runoff: The anterior tibial artery is heavily diseased and appears
to occlude shortly beyond the origin. Posterior tibial artery
occludes shortly beyond the origin. The peroneal artery is diffusely
diseased and appears to occlude above the ankle. Evaluation is
somewhat limited by small vessel size and extensive calcified
plaque.

Veins: No focal venous abnormality.

Review of the MIP images confirms the above findings.

NON-VASCULAR

Lower Chest: The heart is within normal limits for size. No
pericardial effusion. Small hiatal hernia. Extensive centrilobular
and paraseptal emphysema. Respiratory motion slightly limits
evaluation for small pulmonary nodules. No pleural effusion.

Abdomen: Respiratory motion slightly limits evaluation of the upper
abdomen. The stomach is elongated and extends inferiorly to the
level of the umbilicus. Unremarkable appearance of the duodenum,
spleen, adrenal glands and pancreas.

Normal hepatic contour morphology. No discrete hepatic lesion.
Gallbladder is unremarkable. No intra or extrahepatic biliary ductal
dilatation.

Significant movement artifact limits evaluation of the kidneys. No
definite enhancing renal mass or hydronephrosis. No nephrolithiasis.
Bilateral renal cortical thinning.

No focal bowel wall thickening or evidence of bowel obstruction. No
free fluid or suspicious adenopathy.

Pelvis: The uterus is surgically absent. Unremarkable bladder. No
free fluid or suspicious adenopathy.

Bones/Soft Tissues: Postoperative changes of bilateral groin
cut-downs with expected postsurgical inflammation. No definite
seroma or fluid collection. Multilevel degenerative disc disease and
facet arthropathy. Prior lumbar spine surgery with multilevel
laminectomies beginning at L4 and extending through L5. Levoconvex
scoliosis.
IMPRESSION: VASCULAR

1. Patent aortic stent and kissing bilateral common iliac stents.
2. Surgical changes of bilateral common femoral endarterectomies
without evidence of complication.
3. Extensive bilateral outflow and runoff peripheral arterial
disease slightly worse on the right than the left.
4. Multifocal right superficial femoral artery disease largely
secondary to bulky calcified plaque. There are multiple moderate
stenoses throughout the SFA and above the knee popliteal artery as
well as 2 short segments of high-grade stenosis versus occlusion in
the SFA in the upper thigh and at the adductor canal.
5. Single-vessel runoff to the ankle on the right via the anterior
tibial artery. The posterior tibial artery appears congenitally
hypoplastic with the peroneal artery continuing as the PT. The
peroneal artery occludes in the mid calf.
6. More mild left superficial femoral artery disease with 2 focal
moderate to high-grade stenoses, 1 in the distal thigh and 1 in the
above the knee popliteal artery but no definite occlusion.
7. No definite in-line runoff to the left foot. High-grade stenosis
versus short segment occlusion of the proximal anterior tibial
artery. The remainder the anterior tibial artery is diseased but
remains patent and provides the dominant runoff to the foot. The
posterior tibial and peroneal arteries occlude proximally.
8. Stable infrarenal aortic ectasia measuring up to 2.7 cm.
NON VASCULAR

1. Visualization of the lower chest and upper abdomen limited by
fairly extensive respiratory/motion artifact.
2. Extensive centrilobular and paraseptal emphysema.
3. Surgical changes of prior appendectomy and hysterectomy.
4. Multilevel prior lumbar laminectomies.
5. Additional ancillary findings as above without significant
interval change.
